# Patient Record
Sex: Male | Born: 1945 | Race: White | Hispanic: No | Marital: Married | Smoking: Former smoker
Health system: Southern US, Community
[De-identification: ages and names within clinical notes are randomized; demographics above are authoritative.]

## PROBLEM LIST (undated history)

## (undated) DIAGNOSIS — I2510 Atherosclerotic heart disease of native coronary artery without angina pectoris: Secondary | ICD-10-CM

## (undated) DIAGNOSIS — E785 Hyperlipidemia, unspecified: Secondary | ICD-10-CM

## (undated) DIAGNOSIS — I259 Chronic ischemic heart disease, unspecified: Secondary | ICD-10-CM

## (undated) DIAGNOSIS — R7301 Impaired fasting glucose: Secondary | ICD-10-CM

## (undated) DIAGNOSIS — I10 Essential (primary) hypertension: Secondary | ICD-10-CM

## (undated) DIAGNOSIS — M1990 Unspecified osteoarthritis, unspecified site: Secondary | ICD-10-CM

## (undated) HISTORY — DX: Chronic ischemic heart disease, unspecified: I25.9

## (undated) HISTORY — DX: Unspecified osteoarthritis, unspecified site: M19.90

## (undated) HISTORY — PX: CARDIAC CATHETERIZATION: SHX172

## (undated) HISTORY — DX: Impaired fasting glucose: R73.01

## (undated) HISTORY — DX: Atherosclerotic heart disease of native coronary artery without angina pectoris: I25.10

## (undated) HISTORY — DX: Hyperlipidemia, unspecified: E78.5

## (undated) HISTORY — DX: Essential (primary) hypertension: I10

---

## 2013-05-14 DIAGNOSIS — Z951 Presence of aortocoronary bypass graft: Secondary | ICD-10-CM

## 2013-05-14 HISTORY — DX: Presence of aortocoronary bypass graft: Z95.1

## 2013-10-30 LAB — HM COLONOSCOPY

## 2014-04-17 DIAGNOSIS — K4090 Unilateral inguinal hernia, without obstruction or gangrene, not specified as recurrent: Secondary | ICD-10-CM | POA: Diagnosis not present

## 2014-05-01 DIAGNOSIS — E780 Pure hypercholesterolemia: Secondary | ICD-10-CM | POA: Diagnosis not present

## 2014-05-01 DIAGNOSIS — K4090 Unilateral inguinal hernia, without obstruction or gangrene, not specified as recurrent: Secondary | ICD-10-CM | POA: Diagnosis not present

## 2014-05-01 DIAGNOSIS — M1990 Unspecified osteoarthritis, unspecified site: Secondary | ICD-10-CM | POA: Diagnosis not present

## 2014-05-01 DIAGNOSIS — I2510 Atherosclerotic heart disease of native coronary artery without angina pectoris: Secondary | ICD-10-CM | POA: Diagnosis not present

## 2014-05-01 DIAGNOSIS — Z87891 Personal history of nicotine dependence: Secondary | ICD-10-CM | POA: Diagnosis not present

## 2014-05-01 DIAGNOSIS — Z7902 Long term (current) use of antithrombotics/antiplatelets: Secondary | ICD-10-CM | POA: Diagnosis not present

## 2014-05-01 DIAGNOSIS — I10 Essential (primary) hypertension: Secondary | ICD-10-CM | POA: Diagnosis not present

## 2014-05-01 DIAGNOSIS — E785 Hyperlipidemia, unspecified: Secondary | ICD-10-CM | POA: Diagnosis not present

## 2014-05-01 DIAGNOSIS — Z79899 Other long term (current) drug therapy: Secondary | ICD-10-CM | POA: Diagnosis not present

## 2014-05-01 DIAGNOSIS — Z7982 Long term (current) use of aspirin: Secondary | ICD-10-CM | POA: Diagnosis not present

## 2014-05-01 DIAGNOSIS — Z791 Long term (current) use of non-steroidal anti-inflammatories (NSAID): Secondary | ICD-10-CM | POA: Diagnosis not present

## 2014-05-01 DIAGNOSIS — Z955 Presence of coronary angioplasty implant and graft: Secondary | ICD-10-CM | POA: Diagnosis not present

## 2014-06-17 DIAGNOSIS — H25019 Cortical age-related cataract, unspecified eye: Secondary | ICD-10-CM | POA: Diagnosis not present

## 2014-07-15 DIAGNOSIS — I2583 Coronary atherosclerosis due to lipid rich plaque: Secondary | ICD-10-CM | POA: Diagnosis not present

## 2014-07-15 DIAGNOSIS — I2510 Atherosclerotic heart disease of native coronary artery without angina pectoris: Secondary | ICD-10-CM | POA: Diagnosis not present

## 2014-07-28 DIAGNOSIS — I2583 Coronary atherosclerosis due to lipid rich plaque: Secondary | ICD-10-CM | POA: Diagnosis not present

## 2014-07-28 DIAGNOSIS — I10 Essential (primary) hypertension: Secondary | ICD-10-CM | POA: Diagnosis not present

## 2014-07-28 DIAGNOSIS — I2510 Atherosclerotic heart disease of native coronary artery without angina pectoris: Secondary | ICD-10-CM | POA: Diagnosis not present

## 2014-07-28 DIAGNOSIS — R7989 Other specified abnormal findings of blood chemistry: Secondary | ICD-10-CM | POA: Diagnosis not present

## 2014-07-28 DIAGNOSIS — R7301 Impaired fasting glucose: Secondary | ICD-10-CM | POA: Diagnosis not present

## 2014-07-29 DIAGNOSIS — I482 Chronic atrial fibrillation: Secondary | ICD-10-CM | POA: Diagnosis not present

## 2014-07-29 DIAGNOSIS — I2510 Atherosclerotic heart disease of native coronary artery without angina pectoris: Secondary | ICD-10-CM | POA: Diagnosis not present

## 2014-07-29 DIAGNOSIS — R7301 Impaired fasting glucose: Secondary | ICD-10-CM | POA: Diagnosis not present

## 2014-07-29 DIAGNOSIS — E785 Hyperlipidemia, unspecified: Secondary | ICD-10-CM | POA: Diagnosis not present

## 2014-08-01 DIAGNOSIS — R630 Anorexia: Secondary | ICD-10-CM | POA: Diagnosis not present

## 2014-08-01 DIAGNOSIS — D649 Anemia, unspecified: Secondary | ICD-10-CM | POA: Diagnosis not present

## 2014-08-01 DIAGNOSIS — F329 Major depressive disorder, single episode, unspecified: Secondary | ICD-10-CM

## 2014-08-01 DIAGNOSIS — I2583 Coronary atherosclerosis due to lipid rich plaque: Secondary | ICD-10-CM | POA: Diagnosis not present

## 2014-08-01 DIAGNOSIS — R040 Epistaxis: Secondary | ICD-10-CM | POA: Diagnosis not present

## 2014-08-01 DIAGNOSIS — R5383 Other fatigue: Secondary | ICD-10-CM | POA: Diagnosis not present

## 2014-08-01 DIAGNOSIS — I2510 Atherosclerotic heart disease of native coronary artery without angina pectoris: Secondary | ICD-10-CM | POA: Diagnosis not present

## 2014-08-01 DIAGNOSIS — I10 Essential (primary) hypertension: Secondary | ICD-10-CM | POA: Diagnosis not present

## 2014-08-01 DIAGNOSIS — R7989 Other specified abnormal findings of blood chemistry: Secondary | ICD-10-CM | POA: Diagnosis not present

## 2014-08-01 DIAGNOSIS — M1990 Unspecified osteoarthritis, unspecified site: Secondary | ICD-10-CM | POA: Diagnosis not present

## 2014-08-01 DIAGNOSIS — F321 Major depressive disorder, single episode, moderate: Secondary | ICD-10-CM | POA: Diagnosis not present

## 2014-08-01 DIAGNOSIS — E119 Type 2 diabetes mellitus without complications: Secondary | ICD-10-CM | POA: Diagnosis not present

## 2014-08-01 HISTORY — DX: Major depressive disorder, single episode, unspecified: F32.9

## 2014-08-04 DIAGNOSIS — R7989 Other specified abnormal findings of blood chemistry: Secondary | ICD-10-CM | POA: Diagnosis not present

## 2014-08-04 DIAGNOSIS — R634 Abnormal weight loss: Secondary | ICD-10-CM | POA: Diagnosis not present

## 2014-08-04 DIAGNOSIS — N281 Cyst of kidney, acquired: Secondary | ICD-10-CM | POA: Diagnosis not present

## 2014-09-04 DIAGNOSIS — I10 Essential (primary) hypertension: Secondary | ICD-10-CM | POA: Diagnosis not present

## 2014-09-04 DIAGNOSIS — R7989 Other specified abnormal findings of blood chemistry: Secondary | ICD-10-CM | POA: Diagnosis not present

## 2014-09-04 DIAGNOSIS — Z951 Presence of aortocoronary bypass graft: Secondary | ICD-10-CM | POA: Diagnosis not present

## 2014-09-04 DIAGNOSIS — I2583 Coronary atherosclerosis due to lipid rich plaque: Secondary | ICD-10-CM | POA: Diagnosis not present

## 2014-09-04 DIAGNOSIS — E780 Pure hypercholesterolemia: Secondary | ICD-10-CM | POA: Diagnosis not present

## 2014-09-04 DIAGNOSIS — I2510 Atherosclerotic heart disease of native coronary artery without angina pectoris: Secondary | ICD-10-CM | POA: Diagnosis not present

## 2014-09-09 DIAGNOSIS — I2510 Atherosclerotic heart disease of native coronary artery without angina pectoris: Secondary | ICD-10-CM | POA: Diagnosis not present

## 2014-09-09 DIAGNOSIS — E119 Type 2 diabetes mellitus without complications: Secondary | ICD-10-CM | POA: Diagnosis not present

## 2014-09-09 DIAGNOSIS — E780 Pure hypercholesterolemia: Secondary | ICD-10-CM | POA: Diagnosis not present

## 2014-09-09 DIAGNOSIS — I10 Essential (primary) hypertension: Secondary | ICD-10-CM | POA: Diagnosis not present

## 2014-09-09 DIAGNOSIS — E1121 Type 2 diabetes mellitus with diabetic nephropathy: Secondary | ICD-10-CM

## 2014-09-09 DIAGNOSIS — R7301 Impaired fasting glucose: Secondary | ICD-10-CM | POA: Diagnosis not present

## 2014-09-09 DIAGNOSIS — K7689 Other specified diseases of liver: Secondary | ICD-10-CM | POA: Diagnosis not present

## 2014-09-09 DIAGNOSIS — I2583 Coronary atherosclerosis due to lipid rich plaque: Secondary | ICD-10-CM | POA: Diagnosis not present

## 2014-09-09 HISTORY — DX: Type 2 diabetes mellitus with diabetic nephropathy: E11.21

## 2015-01-05 DIAGNOSIS — I2583 Coronary atherosclerosis due to lipid rich plaque: Secondary | ICD-10-CM | POA: Diagnosis not present

## 2015-01-05 DIAGNOSIS — I10 Essential (primary) hypertension: Secondary | ICD-10-CM | POA: Diagnosis not present

## 2015-01-05 DIAGNOSIS — I2510 Atherosclerotic heart disease of native coronary artery without angina pectoris: Secondary | ICD-10-CM | POA: Diagnosis not present

## 2015-01-05 DIAGNOSIS — R7301 Impaired fasting glucose: Secondary | ICD-10-CM | POA: Diagnosis not present

## 2015-01-05 DIAGNOSIS — E119 Type 2 diabetes mellitus without complications: Secondary | ICD-10-CM | POA: Diagnosis not present

## 2015-01-05 DIAGNOSIS — Z23 Encounter for immunization: Secondary | ICD-10-CM | POA: Diagnosis not present

## 2015-01-27 DIAGNOSIS — E7800 Pure hypercholesterolemia, unspecified: Secondary | ICD-10-CM | POA: Diagnosis not present

## 2015-01-27 DIAGNOSIS — Z951 Presence of aortocoronary bypass graft: Secondary | ICD-10-CM | POA: Diagnosis not present

## 2015-01-27 DIAGNOSIS — R7301 Impaired fasting glucose: Secondary | ICD-10-CM | POA: Diagnosis not present

## 2015-01-27 DIAGNOSIS — I10 Essential (primary) hypertension: Secondary | ICD-10-CM | POA: Diagnosis not present

## 2015-01-27 DIAGNOSIS — I2510 Atherosclerotic heart disease of native coronary artery without angina pectoris: Secondary | ICD-10-CM | POA: Diagnosis not present

## 2015-01-27 DIAGNOSIS — I2583 Coronary atherosclerosis due to lipid rich plaque: Secondary | ICD-10-CM | POA: Diagnosis not present

## 2015-02-02 DIAGNOSIS — I519 Heart disease, unspecified: Secondary | ICD-10-CM | POA: Diagnosis present

## 2015-02-02 DIAGNOSIS — R0602 Shortness of breath: Secondary | ICD-10-CM | POA: Diagnosis not present

## 2015-02-02 DIAGNOSIS — Z7982 Long term (current) use of aspirin: Secondary | ICD-10-CM | POA: Diagnosis not present

## 2015-02-02 DIAGNOSIS — I129 Hypertensive chronic kidney disease with stage 1 through stage 4 chronic kidney disease, or unspecified chronic kidney disease: Secondary | ICD-10-CM | POA: Diagnosis not present

## 2015-02-02 DIAGNOSIS — R1310 Dysphagia, unspecified: Secondary | ICD-10-CM | POA: Diagnosis not present

## 2015-02-02 DIAGNOSIS — E785 Hyperlipidemia, unspecified: Secondary | ICD-10-CM | POA: Diagnosis not present

## 2015-02-02 DIAGNOSIS — Z9889 Other specified postprocedural states: Secondary | ICD-10-CM | POA: Diagnosis not present

## 2015-02-02 DIAGNOSIS — Z89011 Acquired absence of right thumb: Secondary | ICD-10-CM | POA: Diagnosis not present

## 2015-02-02 DIAGNOSIS — T18198A Other foreign object in esophagus causing other injury, initial encounter: Secondary | ICD-10-CM | POA: Diagnosis not present

## 2015-02-02 DIAGNOSIS — Z951 Presence of aortocoronary bypass graft: Secondary | ICD-10-CM | POA: Diagnosis not present

## 2015-02-02 DIAGNOSIS — K222 Esophageal obstruction: Secondary | ICD-10-CM | POA: Diagnosis not present

## 2015-02-02 DIAGNOSIS — T18128A Food in esophagus causing other injury, initial encounter: Secondary | ICD-10-CM | POA: Diagnosis not present

## 2015-02-02 DIAGNOSIS — Z955 Presence of coronary angioplasty implant and graft: Secondary | ICD-10-CM | POA: Diagnosis not present

## 2015-02-02 DIAGNOSIS — T17520A Food in bronchus causing asphyxiation, initial encounter: Secondary | ICD-10-CM | POA: Diagnosis not present

## 2015-02-02 DIAGNOSIS — I2510 Atherosclerotic heart disease of native coronary artery without angina pectoris: Secondary | ICD-10-CM | POA: Diagnosis not present

## 2015-02-02 DIAGNOSIS — T18108A Unspecified foreign body in esophagus causing other injury, initial encounter: Secondary | ICD-10-CM | POA: Diagnosis not present

## 2015-02-02 DIAGNOSIS — M1990 Unspecified osteoarthritis, unspecified site: Secondary | ICD-10-CM | POA: Diagnosis not present

## 2015-02-02 DIAGNOSIS — N183 Chronic kidney disease, stage 3 (moderate): Secondary | ICD-10-CM | POA: Diagnosis not present

## 2015-02-02 DIAGNOSIS — Z7902 Long term (current) use of antithrombotics/antiplatelets: Secondary | ICD-10-CM | POA: Diagnosis not present

## 2015-02-02 DIAGNOSIS — Z79899 Other long term (current) drug therapy: Secondary | ICD-10-CM | POA: Diagnosis not present

## 2015-02-02 DIAGNOSIS — E039 Hypothyroidism, unspecified: Secondary | ICD-10-CM | POA: Diagnosis present

## 2015-02-02 DIAGNOSIS — K208 Other esophagitis: Secondary | ICD-10-CM | POA: Diagnosis present

## 2015-02-27 DIAGNOSIS — I259 Chronic ischemic heart disease, unspecified: Secondary | ICD-10-CM | POA: Diagnosis not present

## 2015-02-27 DIAGNOSIS — E1122 Type 2 diabetes mellitus with diabetic chronic kidney disease: Secondary | ICD-10-CM | POA: Diagnosis not present

## 2015-02-27 DIAGNOSIS — I129 Hypertensive chronic kidney disease with stage 1 through stage 4 chronic kidney disease, or unspecified chronic kidney disease: Secondary | ICD-10-CM | POA: Diagnosis present

## 2015-02-27 DIAGNOSIS — R571 Hypovolemic shock: Secondary | ICD-10-CM | POA: Diagnosis not present

## 2015-02-27 DIAGNOSIS — Z7982 Long term (current) use of aspirin: Secondary | ICD-10-CM | POA: Diagnosis not present

## 2015-02-27 DIAGNOSIS — N183 Chronic kidney disease, stage 3 (moderate): Secondary | ICD-10-CM | POA: Diagnosis present

## 2015-02-27 DIAGNOSIS — I2510 Atherosclerotic heart disease of native coronary artery without angina pectoris: Secondary | ICD-10-CM | POA: Diagnosis not present

## 2015-02-27 DIAGNOSIS — E785 Hyperlipidemia, unspecified: Secondary | ICD-10-CM | POA: Diagnosis present

## 2015-02-27 DIAGNOSIS — E1121 Type 2 diabetes mellitus with diabetic nephropathy: Secondary | ICD-10-CM | POA: Diagnosis not present

## 2015-02-27 DIAGNOSIS — E7800 Pure hypercholesterolemia, unspecified: Secondary | ICD-10-CM | POA: Diagnosis present

## 2015-02-27 DIAGNOSIS — Z7902 Long term (current) use of antithrombotics/antiplatelets: Secondary | ICD-10-CM | POA: Diagnosis not present

## 2015-02-27 DIAGNOSIS — D689 Coagulation defect, unspecified: Secondary | ICD-10-CM | POA: Diagnosis not present

## 2015-02-27 DIAGNOSIS — Z9889 Other specified postprocedural states: Secondary | ICD-10-CM | POA: Diagnosis not present

## 2015-02-27 DIAGNOSIS — K922 Gastrointestinal hemorrhage, unspecified: Secondary | ICD-10-CM | POA: Diagnosis not present

## 2015-02-27 DIAGNOSIS — M1990 Unspecified osteoarthritis, unspecified site: Secondary | ICD-10-CM | POA: Diagnosis present

## 2015-02-27 DIAGNOSIS — Z79899 Other long term (current) drug therapy: Secondary | ICD-10-CM | POA: Diagnosis not present

## 2015-02-27 DIAGNOSIS — I951 Orthostatic hypotension: Secondary | ICD-10-CM | POA: Diagnosis present

## 2015-02-27 DIAGNOSIS — I10 Essential (primary) hypertension: Secondary | ICD-10-CM | POA: Diagnosis not present

## 2015-02-27 DIAGNOSIS — K5731 Diverticulosis of large intestine without perforation or abscess with bleeding: Secondary | ICD-10-CM | POA: Diagnosis not present

## 2015-02-27 DIAGNOSIS — Z951 Presence of aortocoronary bypass graft: Secondary | ICD-10-CM | POA: Diagnosis not present

## 2015-02-27 DIAGNOSIS — K625 Hemorrhage of anus and rectum: Secondary | ICD-10-CM | POA: Diagnosis not present

## 2015-02-27 DIAGNOSIS — Z720 Tobacco use: Secondary | ICD-10-CM | POA: Diagnosis not present

## 2015-02-27 DIAGNOSIS — D649 Anemia, unspecified: Secondary | ICD-10-CM | POA: Diagnosis not present

## 2015-02-27 DIAGNOSIS — D62 Acute posthemorrhagic anemia: Secondary | ICD-10-CM | POA: Diagnosis present

## 2015-02-28 DIAGNOSIS — I259 Chronic ischemic heart disease, unspecified: Secondary | ICD-10-CM | POA: Insufficient documentation

## 2015-03-05 DIAGNOSIS — R0600 Dyspnea, unspecified: Secondary | ICD-10-CM | POA: Diagnosis not present

## 2015-03-05 DIAGNOSIS — I10 Essential (primary) hypertension: Secondary | ICD-10-CM | POA: Diagnosis not present

## 2015-03-05 DIAGNOSIS — I129 Hypertensive chronic kidney disease with stage 1 through stage 4 chronic kidney disease, or unspecified chronic kidney disease: Secondary | ICD-10-CM | POA: Diagnosis not present

## 2015-03-05 DIAGNOSIS — R55 Syncope and collapse: Secondary | ICD-10-CM | POA: Diagnosis not present

## 2015-03-05 DIAGNOSIS — R739 Hyperglycemia, unspecified: Secondary | ICD-10-CM | POA: Diagnosis not present

## 2015-03-05 DIAGNOSIS — Z7902 Long term (current) use of antithrombotics/antiplatelets: Secondary | ICD-10-CM | POA: Diagnosis not present

## 2015-03-05 DIAGNOSIS — Z951 Presence of aortocoronary bypass graft: Secondary | ICD-10-CM | POA: Diagnosis not present

## 2015-03-05 DIAGNOSIS — R079 Chest pain, unspecified: Secondary | ICD-10-CM | POA: Diagnosis not present

## 2015-03-05 DIAGNOSIS — E7800 Pure hypercholesterolemia, unspecified: Secondary | ICD-10-CM | POA: Diagnosis not present

## 2015-03-05 DIAGNOSIS — Z79899 Other long term (current) drug therapy: Secondary | ICD-10-CM | POA: Diagnosis not present

## 2015-03-05 DIAGNOSIS — Z87891 Personal history of nicotine dependence: Secondary | ICD-10-CM | POA: Diagnosis not present

## 2015-03-05 DIAGNOSIS — Z7982 Long term (current) use of aspirin: Secondary | ICD-10-CM | POA: Diagnosis not present

## 2015-03-05 DIAGNOSIS — N189 Chronic kidney disease, unspecified: Secondary | ICD-10-CM | POA: Diagnosis not present

## 2015-03-05 DIAGNOSIS — I2510 Atherosclerotic heart disease of native coronary artery without angina pectoris: Secondary | ICD-10-CM | POA: Diagnosis not present

## 2015-03-05 DIAGNOSIS — R0602 Shortness of breath: Secondary | ICD-10-CM | POA: Diagnosis not present

## 2015-03-23 DIAGNOSIS — I2583 Coronary atherosclerosis due to lipid rich plaque: Secondary | ICD-10-CM | POA: Diagnosis not present

## 2015-03-23 DIAGNOSIS — I2510 Atherosclerotic heart disease of native coronary artery without angina pectoris: Secondary | ICD-10-CM | POA: Diagnosis not present

## 2015-03-23 DIAGNOSIS — I10 Essential (primary) hypertension: Secondary | ICD-10-CM | POA: Diagnosis not present

## 2015-03-23 DIAGNOSIS — M10271 Drug-induced gout, right ankle and foot: Secondary | ICD-10-CM | POA: Diagnosis not present

## 2015-03-23 DIAGNOSIS — D508 Other iron deficiency anemias: Secondary | ICD-10-CM | POA: Diagnosis not present

## 2015-04-22 DIAGNOSIS — K922 Gastrointestinal hemorrhage, unspecified: Secondary | ICD-10-CM | POA: Diagnosis not present

## 2015-04-22 DIAGNOSIS — D649 Anemia, unspecified: Secondary | ICD-10-CM | POA: Diagnosis not present

## 2015-06-24 DIAGNOSIS — R208 Other disturbances of skin sensation: Secondary | ICD-10-CM | POA: Diagnosis not present

## 2015-06-24 DIAGNOSIS — L853 Xerosis cutis: Secondary | ICD-10-CM | POA: Diagnosis not present

## 2015-06-29 DIAGNOSIS — R7301 Impaired fasting glucose: Secondary | ICD-10-CM | POA: Diagnosis not present

## 2015-06-29 DIAGNOSIS — M25511 Pain in right shoulder: Secondary | ICD-10-CM | POA: Diagnosis not present

## 2015-06-29 DIAGNOSIS — I10 Essential (primary) hypertension: Secondary | ICD-10-CM | POA: Diagnosis not present

## 2015-06-29 DIAGNOSIS — M25552 Pain in left hip: Secondary | ICD-10-CM | POA: Diagnosis not present

## 2015-06-29 DIAGNOSIS — E7800 Pure hypercholesterolemia, unspecified: Secondary | ICD-10-CM | POA: Diagnosis not present

## 2015-06-30 DIAGNOSIS — M19011 Primary osteoarthritis, right shoulder: Secondary | ICD-10-CM | POA: Diagnosis not present

## 2015-07-28 DIAGNOSIS — I10 Essential (primary) hypertension: Secondary | ICD-10-CM | POA: Diagnosis not present

## 2015-07-28 DIAGNOSIS — I259 Chronic ischemic heart disease, unspecified: Secondary | ICD-10-CM | POA: Diagnosis not present

## 2015-07-28 DIAGNOSIS — R7301 Impaired fasting glucose: Secondary | ICD-10-CM | POA: Diagnosis not present

## 2015-07-28 DIAGNOSIS — E7800 Pure hypercholesterolemia, unspecified: Secondary | ICD-10-CM | POA: Diagnosis not present

## 2015-07-28 DIAGNOSIS — I2510 Atherosclerotic heart disease of native coronary artery without angina pectoris: Secondary | ICD-10-CM | POA: Diagnosis not present

## 2015-08-21 DIAGNOSIS — M25511 Pain in right shoulder: Secondary | ICD-10-CM | POA: Diagnosis not present

## 2015-08-21 DIAGNOSIS — M25552 Pain in left hip: Secondary | ICD-10-CM | POA: Diagnosis not present

## 2015-08-21 DIAGNOSIS — M25512 Pain in left shoulder: Secondary | ICD-10-CM | POA: Diagnosis not present

## 2015-08-24 DIAGNOSIS — I10 Essential (primary) hypertension: Secondary | ICD-10-CM | POA: Diagnosis not present

## 2015-08-24 DIAGNOSIS — R7301 Impaired fasting glucose: Secondary | ICD-10-CM | POA: Diagnosis not present

## 2015-08-24 DIAGNOSIS — I2510 Atherosclerotic heart disease of native coronary artery without angina pectoris: Secondary | ICD-10-CM | POA: Diagnosis not present

## 2015-08-24 DIAGNOSIS — E7800 Pure hypercholesterolemia, unspecified: Secondary | ICD-10-CM | POA: Diagnosis not present

## 2016-01-28 DIAGNOSIS — Z23 Encounter for immunization: Secondary | ICD-10-CM | POA: Diagnosis not present

## 2016-03-31 ENCOUNTER — Telehealth (HOSPITAL_COMMUNITY): Payer: Self-pay | Admitting: Internal Medicine

## 2016-03-31 NOTE — Telephone Encounter (Signed)
Called & spoke with Bruce Jordan @Dr . John Jordan's office in [place]. I advised Bruce DashLucretia that pt is not a candidate to be seen at the [facility] per Bruce StaggersHeather Schub, Bruce Jordan, & would need to be referred to general cardiologist.  Bruce DashLucretia voiced understanding & will advise Dr. Festus AloePasquini.

## 2016-04-12 ENCOUNTER — Telehealth: Payer: Self-pay | Admitting: Cardiology

## 2016-04-12 NOTE — Telephone Encounter (Signed)
Received records from [facility] for appointment on 04/19/16 with Dr SwazilandJordan.  Records put with Dr Elvis CoilJordan's schedule for 04/19/16. lp

## 2016-04-14 NOTE — Progress Notes (Deleted)
   Cardiology Office Note    Date:  04/14/2016   ID:  Bruce Jordan. Bruce Jordan, DOB 1945-11-05, MRN [id number]  PCP:  No primary care provider on file.  Cardiologist:  Torri Michalski [place]az[place]Jordan, MD    History of Present Illness:  Bruce Jordan is a 71 y.o. male ***    No past medical history on file.  No past surgical history on file.  Current Medications: No outpatient prescriptions prior to visit.   No facility-administered medications prior to visit.      Allergies:   Patient has no allergy information on record.   Social History   Social History  . Marital status: N/A    Spouse name: N/A  . Number of children: N/A  . Years of education: N/A   Social History Main Topics  . Smoking status: Not on file  . Smokeless tobacco: Not on file  . Alcohol use Not on file  . Drug use: Unknown  . Sexual activity: Not on file   Other Topics Concern  . Not on file   Social History Narrative  . No narrative on file     Family History:  The patient's ***family history is not on file.   ROS:   Please see the history of present illness.    ROS All other systems reviewed and are negative.   PHYSICAL EXAM:   VS:  There were no vitals taken for this visit.   GEN: Well nourished, well developed, in no acute distress  HEENT: normal  Neck: no JVD, carotid bruits, or masses Cardiac: ***RRR; no murmurs, rubs, or gallops,no edema  Respiratory:  clear to auscultation bilaterally, normal work of breathing GI: soft, nontender, nondistended, + BS MS: no deformity or atrophy  Skin: warm and dry, no rash Neuro:  Alert and Oriented x 3, Strength and sensation are intact Psych: euthymic mood, full affect  Wt Readings from Last 3 Encounters:  No data found for Wt      Studies/Labs Reviewed:   EKG:  EKG is*** ordered today.  The ekg ordered today demonstrates ***  Recent Labs: No results found for requested labs within last 8760 hours.   Lipid Panel No results found for: CHOL, TRIG, HDL,  CHOLHDL, VLDL, LDLCALC, LDLDIRECT  Additional studies/ records that were reviewed today include:  ***  .padscreen  ASSESSMENT:    No diagnosis found.   PLAN:  In order of problems listed above:  1. ***    Medication Adjustments/Labs and Tests Ordered: Current medicines are reviewed at length with the patient today.  Concerns regarding medicines are outlined above.  Medication changes, Labs and Tests ordered today are listed in the Patient Instructions below. There are no Patient Instructions on file for this visit.   Signed, Navreet Bolda [place]azilandJordan, MD  04/14/2016 4:52 PM    [facility] [address], [place], [place], [id number] [phone]

## 2016-04-19 ENCOUNTER — Ambulatory Visit: Payer: Medicare Other | Admitting: Cardiology

## 2016-05-13 ENCOUNTER — Encounter: Payer: Self-pay | Admitting: Cardiology

## 2016-05-13 ENCOUNTER — Ambulatory Visit (INDEPENDENT_AMBULATORY_CARE_PROVIDER_SITE_OTHER): Payer: Medicare Other | Admitting: Cardiology

## 2016-05-13 VITALS — BP 142/79 | HR 59 | Ht 60.0 in | Wt 143.0 lb

## 2016-05-13 DIAGNOSIS — N183 Chronic kidney disease, stage 3 unspecified: Secondary | ICD-10-CM

## 2016-05-13 DIAGNOSIS — I10 Essential (primary) hypertension: Secondary | ICD-10-CM

## 2016-05-13 DIAGNOSIS — E7800 Pure hypercholesterolemia, unspecified: Secondary | ICD-10-CM

## 2016-05-13 DIAGNOSIS — I25118 Atherosclerotic heart disease of native coronary artery with other forms of angina pectoris: Secondary | ICD-10-CM

## 2016-05-13 NOTE — Progress Notes (Signed)
Cardiology Office Note    Date:  05/13/2016   ID:  Bruce Mondrew T Barrero Jr., DOB Nov 13, 1945, MRN [id number]  PCP:  No primary care provider on file.  Cardiologist:  Xavious Sharrar [place]az[place], MD    History of Present Illness:  Bruce Mondrew T Strandberg Jr. is a 71 y.o. male seen at the request of Dr. Jonny RuizJohn Pasquini in [place] to establish cardiac care here in [place]. He has a history of CAD s/p CABG in 2008 in [place] for failed PCI. This was a LIMA to the LAD. In 2013 he had PCI of a tortuous RCA. In April 2016 he had a low risk Cardiolite without evidence of ischemia. He has a history of HTN and dyslipidemia. He has CKD stage 3 with creatinine 1.47. He has diabetes mellitus type 2 with neuropathy.  [place] closer to [place]. [place] [facility] [place] with his wife. 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 He works at [employer] for [employer]. Stays busy. He denies any chest pain, SOB, palpitations, dizziness. Really feels great.     Past Medical History:  Diagnosis Date  . Chronic ischemic heart disease   . Coronary artery disease   . Hyperlipidemia   . Hypertension   . IFG (impaired fasting glucose)   . Osteoarthritis     Past Surgical History:  Procedure Laterality Date  . CARDIAC CATHETERIZATION      Current Medications: Outpatient Medications Prior to Visit  Medication Sig Dispense Refill  . aspirin EC 81 MG tablet Take 81 mg by mouth daily.    Marland Kitchen. atorvastatin (LIPITOR) 40 MG tablet Take 40 mg by mouth daily.    . cholecalciferol (VITAMIN D) 400 units TABS tablet Take 400 Units by mouth daily.    . citalopram (CELEXA) 10 MG tablet Take 10 mg by mouth daily.    . clobetasol (TEMOVATE) 0.05 % GEL Apply topically 2 (two) times daily.    Marland Kitchen. Co-Enzyme Q-10 100 MG CAPS Take 10 mg by mouth daily.    . diclofenac sodium (VOLTAREN) 1 % GEL Apply 2 g topically 4 (four) times daily.    . ferrous sulfate 325 (65 FE) MG tablet Take 325 mg by mouth daily with breakfast.    .  lisinopril (PRINIVIL,ZESTRIL) 20 MG tablet Take 20 mg by mouth daily.    . metoprolol tartrate (LOPRESSOR) 25 MG tablet Take 12.5 mg by mouth 2 (two) times daily.    . niacin 500 MG tablet Take 500 mg by mouth at bedtime.    . sildenafil (VIAGRA) 100 MG tablet Take 100 mg by mouth daily as needed for erectile dysfunction.    . clopidogrel (PLAVIX) 75 MG tablet Take 75 mg by mouth daily.     No facility-administered medications prior to visit.      Allergies:   Patient has no known allergies.   Social History   Social History  . Marital status: Married    Spouse name: N/A  . Number of children: N/A  . Years of education: N/A   Social History Main Topics  . Smoking status: Former Games developermoker  . Smokeless tobacco: Never Used  . Alcohol use None  . Drug use: Unknown  . Sexual activity: Not Asked   Other Topics Concern  . None   Social History Narrative  . None     Family History:  The patient's family history includes Cancer in his father; Heart disease in his father and mother; Hypertension in his mother.  ROS:   Please see the history of present illness.    ROS All other systems reviewed and are negative.   PHYSICAL EXAM:   VS:  BP (!) 142/79 (BP Location: Left Arm, Patient Position: Sitting, Cuff Size: Normal)   Pulse (!) 59   Ht 5' (1.524 m)   Wt 143 lb (64.9 kg)   BMI 27.93 kg/m    GEN: Well nourished, well developed, in no acute distress  HEENT: normal  Neck: no JVD, carotid bruits, or masses Cardiac: RRR; no murmurs, rubs, or gallops,no edema  Respiratory:  clear to auscultation bilaterally, normal work of breathing GI: soft, nontender, nondistended, + BS MS: no deformity or atrophy  Skin: warm and dry, no rash Neuro:  Alert and Oriented x 3, Strength and sensation are intact Psych: euthymic mood, full affect  Wt Readings from Last 3 Encounters:  05/13/16 143 lb (64.9 kg)      Studies/Labs Reviewed:   EKG:  EKG is ordered today.  The ekg ordered today  demonstrates NSR with rate 57. Frequent PVCs. Computer reads a long QT but in my interpretation I think QT normal but there is a prominent U wave.I have personally reviewed and interpreted this study.   Recent Labs: No results found for requested labs within last 8760 hours.   Lipid Panel No results found for: CHOL, TRIG, HDL, CHOLHDL, VLDL, LDLCALC, LDLDIRECT  Additional studies/ records that were reviewed today include:  Labs dated 08/24/15: glucose 177, creatinine 1.47. Otherwise CMET normal. Platelets 118K, otherwise CBC is normal. A1c 5.9%. TSH normal. Cholesterol 170, triglycerides 119, LDL 85, HDL 61. LDL particle number [id number].  ASSESSMENT:    1. Essential hypertension   2. CKD (chronic kidney disease) stage 3, GFR 30-59 ml/min   3. Pure hypercholesterolemia   4. Coronary artery disease of native artery of native heart with stable angina pectoris (HCC)      PLAN:  In order of problems listed above:  1. Patient is doing well from a cardiac standpoint. No symptoms. No indication for Plavix so I have recommended he stop this. Also reviewed lack of any outcome benefit with niacin when used with a statin so OK to stop niacin. Will continue other medications. Follow up in 6 months. Patient to establish with primary care in [place].     Medication Adjustments/Labs and Tests Ordered: Current medicines are reviewed at length with the patient today.  Concerns regarding medicines are outlined above.  Medication changes, Labs and Tests ordered today are listed in the Patient Instructions below. Patient Instructions  You may stop taking Plavix.  Continue your other therapy - OK to stop niacin from my standpoint  I will see you in 6 months.      Signed, Amyr Sluder [place], MD  05/13/2016 4:53 PM    [facility] [address] [place], [place], [address] [phone]

## 2016-05-13 NOTE — Patient Instructions (Signed)
You may stop taking Plavix.  Continue your other therapy - OK to stop niacin from my standpoint  I will see you in 6 months.

## 2016-05-18 ENCOUNTER — Telehealth: Payer: Self-pay | Admitting: Cardiology

## 2016-05-18 NOTE — Telephone Encounter (Signed)
New Message   *STAT* If patient is at the pharmacy, call can be transferred to refill team.   1. Which medications need to be refilled? (please list name of each medication and dose if known) citalopram (Celxa) 10 mg tablet once daily  2. Which pharmacy/location (including street and city if local pharmacy) is medication to be sent to[facility] [address], [place]  3. Do they need a 30 day or 90 day supply? 30 day supply

## 2016-05-18 NOTE — Telephone Encounter (Signed)
We can refill for now until he gets established with primary care here  Ambriel Gorelick Swaz[place]Jordan MD, [facility]

## 2016-05-18 NOTE — Telephone Encounter (Signed)
Will forward to Dr SwazilandJordan,  To confirm if okay fill x one or refer for primary Reviewed chart patient is new to area.

## 2016-05-19 ENCOUNTER — Telehealth: Payer: Self-pay | Admitting: Internal Medicine

## 2016-05-19 MED ORDER — CITALOPRAM HYDROBROMIDE 10 MG PO TABS: 10.0000 mg | ORAL_TABLET | Freq: Every day | ORAL | 1 refills | Status: DC

## 2016-05-19 NOTE — Telephone Encounter (Signed)
Patient would like to know if he can be taken on as a patient of Dr. Yetta BarreJones.

## 2016-05-19 NOTE — Telephone Encounter (Signed)
Spoke to patient.Advised of Dr.Jordan's recommendations.Stated he will be making PCP appointment today.Celexa refill sent to pharmacy.

## 2016-05-19 NOTE — Telephone Encounter (Signed)
yes

## 2016-05-20 NOTE — Telephone Encounter (Signed)
Got patient scheduled

## 2016-05-25 ENCOUNTER — Ambulatory Visit (INDEPENDENT_AMBULATORY_CARE_PROVIDER_SITE_OTHER): Payer: Medicare Other | Admitting: Internal Medicine

## 2016-05-25 ENCOUNTER — Encounter: Payer: Self-pay | Admitting: Internal Medicine

## 2016-05-25 VITALS — BP 110/50 | HR 51 | Temp 97.7°F | Ht 60.0 in | Wt 140.0 lb

## 2016-05-25 DIAGNOSIS — L438 Other lichen planus: Secondary | ICD-10-CM

## 2016-05-25 DIAGNOSIS — Z23 Encounter for immunization: Secondary | ICD-10-CM | POA: Diagnosis not present

## 2016-05-25 DIAGNOSIS — E785 Hyperlipidemia, unspecified: Secondary | ICD-10-CM | POA: Diagnosis not present

## 2016-05-25 DIAGNOSIS — L309 Dermatitis, unspecified: Secondary | ICD-10-CM | POA: Insufficient documentation

## 2016-05-25 DIAGNOSIS — M19011 Primary osteoarthritis, right shoulder: Secondary | ICD-10-CM

## 2016-05-25 DIAGNOSIS — I10 Essential (primary) hypertension: Secondary | ICD-10-CM

## 2016-05-25 DIAGNOSIS — I2510 Atherosclerotic heart disease of native coronary artery without angina pectoris: Secondary | ICD-10-CM | POA: Insufficient documentation

## 2016-05-25 DIAGNOSIS — R7303 Prediabetes: Secondary | ICD-10-CM

## 2016-05-25 DIAGNOSIS — N400 Enlarged prostate without lower urinary tract symptoms: Secondary | ICD-10-CM

## 2016-05-25 DIAGNOSIS — D500 Iron deficiency anemia secondary to blood loss (chronic): Secondary | ICD-10-CM

## 2016-05-25 DIAGNOSIS — M19012 Primary osteoarthritis, left shoulder: Secondary | ICD-10-CM

## 2016-05-25 HISTORY — DX: Primary osteoarthritis, right shoulder: M19.011

## 2016-05-25 HISTORY — DX: Benign prostatic hyperplasia without lower urinary tract symptoms: N40.0

## 2016-05-25 HISTORY — DX: Essential (primary) hypertension: I10

## 2016-05-25 HISTORY — DX: Dermatitis, unspecified: L30.9

## 2016-05-25 HISTORY — DX: Hyperlipidemia, unspecified: E78.5

## 2016-05-25 HISTORY — DX: Other lichen planus: L43.8

## 2016-05-25 HISTORY — DX: Atherosclerotic heart disease of native coronary artery without angina pectoris: I25.10

## 2016-05-25 HISTORY — DX: Iron deficiency anemia secondary to blood loss (chronic): D50.0

## 2016-05-25 MED ORDER — MELOXICAM 15 MG PO TABS: 15.0000 mg | ORAL_TABLET | Freq: Every day | ORAL | 1 refills | Status: DC

## 2016-05-25 MED ORDER — CLOBETASOL PROPIONATE 0.05 % EX GEL: 1.0000 | Freq: Two times a day (BID) | CUTANEOUS | 2 refills | Status: DC

## 2016-05-25 MED ORDER — CLOBETASOL PROPIONATE 0.05 % EX CREA: 1.0000 "application " | TOPICAL_CREAM | Freq: Two times a day (BID) | CUTANEOUS | 2 refills | Status: DC

## 2016-05-25 NOTE — Progress Notes (Signed)
Pre visit review using our clinic review tool, if applicable. No additional management support is needed unless otherwise documented below in the visit note. 

## 2016-05-25 NOTE — Progress Notes (Signed)
Subjective:  Patient ID: Bruce Jordan., male    DOB: [birth year]  Age: 71 y.o. MRN: [id number]  CC: Hypertension; Hyperlipidemia; and Anemia  NEW TO ME  HPI [name]. presents for Establishing as a primary care patient. He has had coronary artery bypass and stenting in 2008 and 2015 respectively. He has had no recent episodes of chest pain or shortness of breath. 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 He takes iron replacement therapy. He also has chronic rash on his lower extremities diagnosed as eczema. 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 These 2 skin conditions are treated successfully with topical steroids. He complains of chronic bilateral shoulder pain and wants to try an anti-inflammatory. He previously saw an orthopedist in [place] and was told that he had DJD in his shoulders. He does not want to have shoulder surgery.  History Schylar has a past medical history of Chronic ischemic heart disease; Coronary artery disease; Hyperlipidemia; Hypertension; IFG (impaired fasting glucose); and Osteoarthritis.   He has a past surgical history that includes Cardiac catheterization.   His family history includes Cancer in his father; Heart disease in his father and mother; Hypertension in his mother.He reports that he has quit smoking. He has never used smokeless tobacco. His alcohol and drug histories are not on file.  Outpatient Medications Prior to Visit  Medication Sig Dispense Refill  . Ascorbic Acid (VITAMIN C) 100 MG tablet Take 100 mg by mouth daily.    Marland Kitchen aspirin EC 81 MG tablet Take 81 mg by mouth daily.    Marland Kitchen atorvastatin (LIPITOR) 40 MG tablet Take 40 mg by mouth daily.    . cholecalciferol (VITAMIN D) 400 units TABS tablet Take 400 Units by mouth daily.    . citalopram (CELEXA) 10 MG tablet Take 1 tablet (10 mg total) by mouth daily. 30 tablet 1  . Co-Enzyme Q-10 100 MG CAPS Take 10 mg by mouth daily.     . diclofenac sodium (VOLTAREN) 1 % GEL Apply 2 g topically 4 (four) times daily.    . ferrous sulfate 325 (65 FE) MG tablet Take 325 mg by mouth daily with breakfast.    . lisinopril (PRINIVIL,ZESTRIL) 20 MG tablet Take 20 mg by mouth daily.    . metoprolol tartrate (LOPRESSOR) 25 MG tablet Take 12.5 mg by mouth 2 (two) times daily.    . niacin 500 MG tablet Take 500 mg by mouth at bedtime.    . sildenafil (VIAGRA) 100 MG tablet Take 100 mg by mouth daily as needed for erectile dysfunction.    . clobetasol (TEMOVATE) 0.05 % GEL Apply topically 2 (two) times daily.     No facility-administered medications prior to visit.     ROS Review of Systems  Constitutional: Negative for appetite change, diaphoresis, fatigue and unexpected weight change.  HENT: Negative.  Negative for trouble swallowing.   Eyes: Negative for visual disturbance.  Respiratory: Negative for cough, chest tightness, shortness of breath and wheezing.   Cardiovascular: Negative for chest pain, palpitations and leg swelling.  Gastrointestinal: Negative for abdominal pain, constipation, diarrhea, nausea and vomiting.  Endocrine: Negative.   Genitourinary: Negative.  Negative for difficulty urinating.  Musculoskeletal: Positive for arthralgias. Negative for back pain, myalgias and neck pain.  Skin: Negative.  Negative for color change and rash.  Allergic/Immunologic: Negative.   Neurological: Negative.  Negative for dizziness, weakness and light-headedness.  Hematological: Negative.  Negative for adenopathy. Does not bruise/bleed easily.  Psychiatric/Behavioral: Negative for dysphoric mood, sleep disturbance and suicidal ideas. The patient is nervous/anxious.     Objective:  BP (!) 110/50 (BP Location: Left Arm, Patient Position: Sitting, Cuff Size: Normal)   Pulse (!) 51   Temp 97.7 F (36.5 C) (Oral)   Ht 5' (1.524 m)   Wt 140 lb (63.5 kg)   SpO2 97%   BMI 27.34 kg/m   Physical Exam  Constitutional: He is  oriented to person, place, and time. No distress.  HENT:  Mouth/Throat: Oropharynx is clear and moist. No oropharyngeal exudate.  Eyes: Conjunctivae are normal. Right eye exhibits no discharge. Left eye exhibits no discharge. No scleral icterus.  Neck: Normal range of motion. Neck supple. No JVD present. No tracheal deviation present. No thyromegaly present.  Cardiovascular: Normal rate, regular rhythm, normal heart sounds and intact distal pulses.  Exam reveals no gallop and no friction rub.   No murmur heard. Pulmonary/Chest: Effort normal and breath sounds normal. No stridor. No respiratory distress. He has no wheezes. He has no rales. He exhibits no tenderness.  Abdominal: Soft. Bowel sounds are normal. He exhibits no distension and no mass. There is no tenderness. There is no rebound and no guarding.  Musculoskeletal: Normal range of motion. He exhibits no edema, tenderness or deformity.  Lymphadenopathy:    He has no cervical adenopathy.  Neurological: He is oriented to person, place, and time.  Skin: Skin is warm and dry. Rash noted. He is not diaphoretic. No erythema. No pallor.  He has mild lichen planus of the tongue and eczematous patches over his lower extremities.  Vitals reviewed.   Lab Results  Component Value Date   WBC 8.2 05/26/2016   HGB 14.3 05/26/2016   HCT 41.9 05/26/2016   PLT 140.0 (L) 05/26/2016   GLUCOSE 124 (H) 05/26/2016   CHOL 144 05/26/2016   TRIG 98.0 05/26/2016   HDL 62.80 05/26/2016   LDLCALC 62 05/26/2016   ALT 13 05/26/2016   AST 18 05/26/2016   NA 143 05/26/2016   K 4.8 05/26/2016   CL 110 05/26/2016   CREATININE 1.27 05/26/2016   BUN 19 05/26/2016   CO2 27 05/26/2016   TSH 2.94 05/26/2016   PSA 2.40 05/26/2016   HGBA1C 5.7 05/26/2016    Assessment & Plan:   Amadeo was seen today for hypertension, hyperlipidemia and anemia.  Diagnoses and all orders for this visit:  Coronary artery disease involving native coronary artery of native  heart without angina pectoris- He's had no recent episodes of angina, will continue risk factor modification with statin therapy. -     Lipid panel; Future  Hyperlipidemia with target LDL less than 70- he is achieved his LDL goal is doing well on the statin. -     Lipid panel; Future -     TSH; Future  Essential hypertension- his blood pressure is adequately well controlled, electrolytes and renal function are normal. -     Comprehensive metabolic panel; Future -     CBC with Differential/Platelet; Future -     CBC with Differential/Platelet; Future  Prediabetes -     Hemoglobin A1c; Future  Need for vaccination against Streptococcus pneumoniae -     Pneumococcal polysaccharide vaccine 23-valent greater than or equal to 2yo subcutaneous/IM  Iron deficiency anemia due to chronic blood loss - his anemia has resolved, will continue iron replacement therapy. To screen for colon polyps and colon cancer I ordered a  ColoGuard today. -     CBC with Differential/Platelet; Future -     IBC panel; Future -     Ferritin; Future  Benign prostatic hyperplasia without lower urinary tract symptoms -     PSA; Future  Erosive oral lichen planus -     clobetasol (TEMOVATE) 0.05 % GEL; Apply 1 Act topically 2 (two) times daily.  Eczema, unspecified type -     clobetasol cream (TEMOVATE) 0.05 %; Apply 1 application topically 2 (two) times daily.  Primary osteoarthritis of both shoulders -     meloxicam (MOBIC) 15 MG tablet; Take 1 tablet (15 mg total) by mouth daily.  Need for prophylactic vaccination against Streptococcus pneumoniae (pneumococcus)   I have discontinued Mr. Marlow BaarsSlaine's clobetasol. I have also changed his clobetasol. Additionally, I am having him start on meloxicam. Lastly, I am having him maintain his aspirin EC, atorvastatin, cholecalciferol, Co-Enzyme Q-10, diclofenac sodium, ferrous sulfate, lisinopril, metoprolol tartrate, niacin, sildenafil, vitamin C, citalopram, and clobetasol  cream.  Meds ordered this encounter  Medications  . DISCONTD: clobetasol (TEMOVATE) 0.05 % GEL    Sig: Apply topically.  Marland Kitchen. DISCONTD: clobetasol cream (TEMOVATE) 0.05 %    Sig: Apply 1 application topically 2 (two) times daily.  . clobetasol cream (TEMOVATE) 0.05 %    Sig: Apply 1 application topically 2 (two) times daily.    Dispense:  60 g    Refill:  2  . clobetasol (TEMOVATE) 0.05 % GEL    Sig: Apply 1 Act topically 2 (two) times daily.    Dispense:  60 each    Refill:  2  . meloxicam (MOBIC) 15 MG tablet    Sig: Take 1 tablet (15 mg total) by mouth daily.    Dispense:  90 tablet    Refill:  1     Follow-up: Return in about 6 months (around 11/22/2016).  Sanda Lingerhomas Macyn Remmert, MD

## 2016-05-25 NOTE — Patient Instructions (Signed)
Hypertension Hypertension, commonly called high blood pressure, is when the force of blood pumping through your arteries is too strong. Your arteries are the blood vessels that carry blood from your heart throughout your body. A blood pressure reading consists of a higher number over a lower number, such as 110/72. The higher number (systolic) is the pressure inside your arteries when your heart pumps. The lower number (diastolic) is the pressure inside your arteries when your heart relaxes. Ideally you want your blood pressure below 120/80. Hypertension forces your heart to work harder to pump blood. Your arteries may become narrow or stiff. Having untreated or uncontrolled hypertension can cause heart attack, stroke, kidney disease, and other problems. What increases the risk? Some risk factors for high blood pressure are controllable. Others are not. Risk factors you cannot control include:  Race. You may be at higher risk if you are African American.  Age. Risk increases with age.  Gender. Men are at higher risk than women before age 45 years. After age 65, women are at higher risk than men. Risk factors you can control include:  Not getting enough exercise or physical activity.  Being overweight.  Getting too much fat, sugar, calories, or salt in your diet.  Drinking too much alcohol. What are the signs or symptoms? Hypertension does not usually cause signs or symptoms. Extremely high blood pressure (hypertensive crisis) may cause headache, anxiety, shortness of breath, and nosebleed. How is this diagnosed? To check if you have hypertension, your health care provider will measure your blood pressure while you are seated, with your arm held at the level of your heart. It should be measured at least twice using the same arm. Certain conditions can cause a difference in blood pressure between your right and left arms. A blood pressure reading that is higher than normal on one occasion does  not mean that you need treatment. If it is not clear whether you have high blood pressure, you may be asked to return on a different day to have your blood pressure checked again. Or, you may be asked to monitor your blood pressure at home for 1 or more weeks. How is this treated? Treating high blood pressure includes making lifestyle changes and possibly taking medicine. Living a healthy lifestyle can help lower high blood pressure. You may need to change some of your habits. Lifestyle changes may include:  Following the DASH diet. This diet is high in fruits, vegetables, and whole grains. It is low in salt, red meat, and added sugars.  Keep your sodium intake below 2,300 mg per day.  Getting at least 30-45 minutes of aerobic exercise at least 4 times per week.  Losing weight if necessary.  Not smoking.  Limiting alcoholic beverages.  Learning ways to reduce stress. Your health care provider may prescribe medicine if lifestyle changes are not enough to get your blood pressure under control, and if one of the following is true:  You are 18-59 years of age and your systolic blood pressure is above 140.  You are 60 years of age or older, and your systolic blood pressure is above 150.  Your diastolic blood pressure is above 90.  You have diabetes, and your systolic blood pressure is over 140 or your diastolic blood pressure is over 90.  You have kidney disease and your blood pressure is above 140/90.  You have heart disease and your blood pressure is above 140/90. Your personal target blood pressure may vary depending on your medical   conditions, your age, and other factors. Follow these instructions at home:  Have your blood pressure rechecked as directed by your health care provider.  Take medicines only as directed by your health care provider. Follow the directions carefully. Blood pressure medicines must be taken as prescribed. The medicine does not work as well when you skip  doses. Skipping doses also puts you at risk for problems.  Do not smoke.  Monitor your blood pressure at home as directed by your health care provider. Contact a health care provider if:  You think you are having a reaction to medicines taken.  You have recurrent headaches or feel dizzy.  You have swelling in your ankles.  You have trouble with your vision. Get help right away if:  You develop a severe headache or confusion.  You have unusual weakness, numbness, or feel faint.  You have severe chest or abdominal pain.  You vomit repeatedly.  You have trouble breathing. This information is not intended to replace advice given to you by your health care provider. Make sure you discuss any questions you have with your health care provider. Document Released: 03/28/2005 Document Revised: 09/03/2015 Document Reviewed: 01/18/2013 Elsevier Interactive Patient Education  2017 Elsevier Inc.  

## 2016-05-26 ENCOUNTER — Other Ambulatory Visit (INDEPENDENT_AMBULATORY_CARE_PROVIDER_SITE_OTHER): Payer: Medicare Other

## 2016-05-26 DIAGNOSIS — I10 Essential (primary) hypertension: Secondary | ICD-10-CM

## 2016-05-26 DIAGNOSIS — N400 Enlarged prostate without lower urinary tract symptoms: Secondary | ICD-10-CM

## 2016-05-26 DIAGNOSIS — I2510 Atherosclerotic heart disease of native coronary artery without angina pectoris: Secondary | ICD-10-CM

## 2016-05-26 DIAGNOSIS — R7303 Prediabetes: Secondary | ICD-10-CM | POA: Diagnosis not present

## 2016-05-26 DIAGNOSIS — E785 Hyperlipidemia, unspecified: Secondary | ICD-10-CM

## 2016-05-26 DIAGNOSIS — D500 Iron deficiency anemia secondary to blood loss (chronic): Secondary | ICD-10-CM | POA: Diagnosis not present

## 2016-05-26 LAB — IBC PANEL
IRON: 86 ug/dL (ref 42–165)
Saturation Ratios: 25.9 % (ref 20.0–50.0)
Transferrin: 237 mg/dL (ref 212.0–360.0)

## 2016-05-26 LAB — CBC WITH DIFFERENTIAL/PLATELET
BASOS ABS: 0 10*3/uL (ref 0.0–0.1)
Basophils Relative: 0.6 % (ref 0.0–3.0)
EOS ABS: 0.3 10*3/uL (ref 0.0–0.7)
Eosinophils Relative: 4.2 % (ref 0.0–5.0)
HEMATOCRIT: 41.9 % (ref 39.0–52.0)
Hemoglobin: 14.3 g/dL (ref 13.0–17.0)
Lymphs Abs: 0.4 10*3/uL — ABNORMAL LOW (ref 0.7–4.0)
MCHC: 34.1 g/dL (ref 30.0–36.0)
MCV: 98.4 fl (ref 78.0–100.0)
Monocytes Absolute: 0.7 10*3/uL (ref 0.1–1.0)
Monocytes Relative: 9 % (ref 3.0–12.0)
NEUTROS ABS: 6.6 10*3/uL (ref 1.4–7.7)
NEUTROS PCT: 80.8 % — AB (ref 43.0–77.0)
PLATELETS: 140 10*3/uL — AB (ref 150.0–400.0)
RBC: 4.26 Mil/uL (ref 4.22–5.81)
RDW: 14.6 % (ref 11.5–15.5)
WBC: 8.2 10*3/uL (ref 4.0–10.5)

## 2016-05-26 LAB — COMPREHENSIVE METABOLIC PANEL
ALBUMIN: 4.3 g/dL (ref 3.5–5.2)
ALT: 13 U/L (ref 0–53)
AST: 18 U/L (ref 0–37)
Alkaline Phosphatase: 86 U/L (ref 39–117)
BILIRUBIN TOTAL: 0.8 mg/dL (ref 0.2–1.2)
BUN: 19 mg/dL (ref 6–23)
CALCIUM: 9.6 mg/dL (ref 8.4–10.5)
CHLORIDE: 110 meq/L (ref 96–112)
CO2: 27 mEq/L (ref 19–32)
CREATININE: 1.27 mg/dL (ref 0.40–1.50)
GFR: 59.13 mL/min — AB (ref >60.00)
Glucose, Bld: 124 mg/dL — ABNORMAL HIGH (ref 70–99)
Potassium: 4.8 mEq/L (ref 3.5–5.1)
Sodium: 143 mEq/L (ref 135–145)
Total Protein: 6.6 g/dL (ref 6.0–8.3)

## 2016-05-26 LAB — LIPID PANEL
CHOL/HDL RATIO: 2
CHOLESTEROL: 144 mg/dL (ref 0–200)
HDL: 62.8 mg/dL (ref >39.00)
LDL CALC: 62 mg/dL (ref 0–99)
NonHDL: 81.41
TRIGLYCERIDES: 98 mg/dL (ref 0.0–149.0)
VLDL: 19.6 mg/dL (ref 0.0–40.0)

## 2016-05-26 LAB — FERRITIN: FERRITIN: 189.5 ng/mL (ref 22.0–322.0)

## 2016-05-26 LAB — HEMOGLOBIN A1C: HEMOGLOBIN A1C: 5.7 % (ref 4.6–6.5)

## 2016-05-26 LAB — PSA: PSA: 2.4 ng/mL (ref 0.10–4.00)

## 2016-05-26 LAB — TSH: TSH: 2.94 u[IU]/mL (ref 0.35–4.50)

## 2016-05-29 ENCOUNTER — Encounter: Payer: Self-pay | Admitting: Internal Medicine

## 2016-05-30 ENCOUNTER — Encounter: Payer: Self-pay | Admitting: Internal Medicine

## 2016-07-23 ENCOUNTER — Other Ambulatory Visit: Payer: Self-pay | Admitting: Cardiology

## 2016-07-25 NOTE — Telephone Encounter (Signed)
This should be filled by primary care  Kamarah Bilotta MD, FACC   

## 2016-07-26 NOTE — Addendum Note (Signed)
Addended by: Neoma Laming on: 07/26/2016 04:18 PM   Modules accepted: Orders

## 2016-07-28 ENCOUNTER — Encounter: Payer: Self-pay | Admitting: Cardiology

## 2016-08-02 ENCOUNTER — Other Ambulatory Visit: Payer: Self-pay

## 2016-08-02 MED ORDER — CITALOPRAM HYDROBROMIDE 10 MG PO TABS: 10.0000 mg | ORAL_TABLET | Freq: Every day | ORAL | 0 refills | Status: DC

## 2016-08-03 ENCOUNTER — Encounter: Payer: Self-pay | Admitting: Internal Medicine

## 2016-08-04 MED ORDER — ATORVASTATIN CALCIUM 40 MG PO TABS: 40.0000 mg | ORAL_TABLET | Freq: Every day | ORAL | 3 refills | Status: DC

## 2016-08-09 ENCOUNTER — Encounter: Payer: Self-pay | Admitting: Internal Medicine

## 2016-08-09 ENCOUNTER — Other Ambulatory Visit: Payer: Self-pay | Admitting: Internal Medicine

## 2016-08-09 DIAGNOSIS — L438 Other lichen planus: Secondary | ICD-10-CM

## 2016-08-09 DIAGNOSIS — N5201 Erectile dysfunction due to arterial insufficiency: Secondary | ICD-10-CM

## 2016-08-09 DIAGNOSIS — L309 Dermatitis, unspecified: Secondary | ICD-10-CM

## 2016-08-09 HISTORY — DX: Erectile dysfunction due to arterial insufficiency: N52.01

## 2016-08-09 MED ORDER — CLOBETASOL PROPIONATE 0.05 % EX CREA: 1.0000 "application " | TOPICAL_CREAM | Freq: Two times a day (BID) | CUTANEOUS | 3 refills | Status: DC

## 2016-08-09 MED ORDER — SILDENAFIL CITRATE 100 MG PO TABS: 100.0000 mg | ORAL_TABLET | Freq: Every day | ORAL | 11 refills | Status: DC | PRN

## 2016-08-25 ENCOUNTER — Encounter: Payer: Self-pay | Admitting: Internal Medicine

## 2016-08-25 MED ORDER — LISINOPRIL 20 MG PO TABS: 20.0000 mg | ORAL_TABLET | Freq: Every day | ORAL | 1 refills | Status: DC

## 2016-09-06 ENCOUNTER — Encounter: Payer: Self-pay | Admitting: Internal Medicine

## 2016-09-06 MED ORDER — CITALOPRAM HYDROBROMIDE 10 MG PO TABS: 10.0000 mg | ORAL_TABLET | Freq: Every day | ORAL | 1 refills | Status: DC

## 2016-09-07 ENCOUNTER — Encounter: Payer: Self-pay | Admitting: Internal Medicine

## 2016-09-07 MED ORDER — METOPROLOL TARTRATE 25 MG PO TABS: 12.5000 mg | ORAL_TABLET | Freq: Two times a day (BID) | ORAL | 1 refills | Status: DC

## 2016-11-21 ENCOUNTER — Other Ambulatory Visit: Payer: Self-pay | Admitting: Internal Medicine

## 2016-11-21 ENCOUNTER — Encounter: Payer: Self-pay | Admitting: Cardiology

## 2016-11-21 ENCOUNTER — Encounter: Payer: Self-pay | Admitting: Internal Medicine

## 2016-11-21 DIAGNOSIS — M19011 Primary osteoarthritis, right shoulder: Secondary | ICD-10-CM

## 2016-11-21 DIAGNOSIS — M19012 Primary osteoarthritis, left shoulder: Principal | ICD-10-CM

## 2016-11-21 MED ORDER — MELOXICAM 15 MG PO TABS: 15.0000 mg | ORAL_TABLET | Freq: Every day | ORAL | 0 refills | Status: DC

## 2016-11-22 NOTE — Progress Notes (Signed)
Cardiology Office Note    Date:  11/24/2016   ID:  Bruce Meinders., DOB 06/08/1945, MRN [id number]  PCP:  Etta Grandchild, MD  Cardiologist:  Jalon Blackwelder [place], MD    History of Present Illness:  Bruce Angerer. is a 71 y.o. male seen for follow up CAD. He has a history of CAD s/p CABG in 2008 in [place] for failed PCI. This was a LIMA to the LAD. In 2013 he had PCI of a tortuous RCA. In April 2016 he had a low risk Cardiolite without evidence of ischemia. He has a history of HTN and dyslipidemia. 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  He moved to [place] to be closer to family. Lives at [facility] with his wife. 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 He works at [employer] for [employer]. Stays very  Busy and does a lot of walking. He denies any chest pain, SOB, palpitations, dizziness.     Past Medical History:  Diagnosis Date  . Chronic ischemic heart disease   . Coronary artery disease   . Hyperlipidemia   . Hypertension   . IFG (impaired fasting glucose)   . Osteoarthritis     Past Surgical History:  Procedure Laterality Date  . CARDIAC CATHETERIZATION      Current Medications: Outpatient Medications Prior to Visit  Medication Sig Dispense Refill  . Ascorbic Acid (VITAMIN C) 100 MG tablet Take 100 mg by mouth daily.    Marland Kitchen aspirin EC 81 MG tablet Take 81 mg by mouth daily.    Marland Kitchen atorvastatin (LIPITOR) 40 MG tablet Take 1 tablet (40 mg total) by mouth daily. 90 tablet 3  . cholecalciferol (VITAMIN D) 400 units TABS tablet Take 400 Units by mouth daily.    . citalopram (CELEXA) 10 MG tablet Take 1 tablet (10 mg total) by mouth daily. 90 tablet 1  . clobetasol cream (TEMOVATE) 0.05 % Apply 1 application topically 2 (two) times daily. 60 g 3  . Co-Enzyme Q-10 100 MG CAPS Take 10 mg by mouth daily.    . diclofenac sodium (VOLTAREN) 1 % GEL Apply 2 g topically 4 (four) times daily.    . ferrous sulfate 325 (65 FE) MG tablet Take  325 mg by mouth daily with breakfast.    . lisinopril (PRINIVIL,ZESTRIL) 20 MG tablet Take 1 tablet (20 mg total) by mouth daily. 90 tablet 1  . meloxicam (MOBIC) 15 MG tablet Take 1 tablet (15 mg total) by mouth daily. 90 tablet 0  . metoprolol tartrate (LOPRESSOR) 25 MG tablet Take 0.5 tablets (12.5 mg total) by mouth 2 (two) times daily. 90 tablet 1  . sildenafil (VIAGRA) 100 MG tablet Take 1 tablet (100 mg total) by mouth daily as needed for erectile dysfunction. 10 tablet 11  . niacin 500 MG tablet Take 500 mg by mouth at bedtime.     No facility-administered medications prior to visit.      Allergies:   Patient has no known allergies.   Social History   Social History  . Marital status: Married    Spouse name: N/A  . Number of children: N/A  . Years of education: N/A   Social History Main Topics  . Smoking status: Former Games developer  . Smokeless tobacco: Never Used  . Alcohol use No  . Drug use: No  . Sexual activity: Yes    Birth control/ protection: None   Other Topics Concern  .  None   Social History Narrative  . None     Family History:  The patient's family history includes Cancer in his father; Early death (age of onset: 8630) in his brother; Early death (age of onset: 4840) in his brother; Heart disease in his father and mother; Hypertension in his mother; Stroke in his brother.   ROS:   Please see the history of present illness.    ROS All other systems reviewed and are negative.   PHYSICAL EXAM:   VS:  BP 136/78   Pulse 62   Ht 5\' 5"  (1.651 m)   Wt 136 lb 3.2 oz (61.8 kg)   SpO2 96%   BMI 22.66 kg/m    GEN: Well nourished, well developed, in no acute distress  HEENT:  PERRL, EOMI, sclera are clear. Oropharynx is clear. NECK:  No jugular venous distention, carotid upstroke brisk and symmetric, no bruits, no thyromegaly or adenopathy LUNGS:  Clear to auscultation bilaterally CHEST:  Unremarkable HEART:  RRR,  PMI not displaced or sustained,S1 and S2 within  normal limits, no S3, no S4: no clicks, no rubs, no murmurs ABD:  Soft, nontender. BS +, no masses or bruits. No hepatomegaly, no splenomegaly EXT:  2 + pulses throughout, no edema, no cyanosis no clubbing SKIN:  Warm and dry.  No rashes NEURO:  Alert and oriented x 3. Cranial nerves II through XII intact. PSYCH:  Cognitively intact    Wt Readings from Last 3 Encounters:  11/24/16 136 lb 3.2 oz (61.8 kg)  05/25/16 140 lb (63.5 kg)  05/13/16 143 lb (64.9 kg)      Studies/Labs Reviewed:   EKG:  EKG is not ordered today.     Recent Labs: 05/26/2016: ALT 13; BUN 19; Creatinine, Ser 1.27; Hemoglobin 14.3; Platelets 140.0; Potassium 4.8; Sodium 143; TSH 2.94   Lipid Panel    Component Value Date/Time   CHOL 144 05/26/2016 0912   TRIG 98.0 05/26/2016 0912   HDL 62.80 05/26/2016 0912   CHOLHDL 2 05/26/2016 0912   VLDL 19.6 05/26/2016 0912   LDLCALC 62 05/26/2016 0912    Additional studies/ records that were reviewed today include:  Labs dated 08/24/15: glucose 177, creatinine 1.47. Otherwise CMET normal. Platelets 118K, otherwise CBC is normal. A1c 5.9%. TSH normal. Cholesterol 170, triglycerides 119, LDL 85, HDL 61. LDL particle number [id number]. Dated 05/26/16: cholesterol 144, triglycerides 98, HDL 62, LDL 62. A1c 5.7%. Creatinine 1.27. Hgb, TSH, ALT normal.  ASSESSMENT:    1.  Coronary artery disease of native artery of native heart with stable angina pectoris (HCC)   2. CKD (chronic kidney disease) stage 3, GFR 30-59 ml/min   3. Pure hypercholesterolemia   4. Essential hypertension      PLAN:  In order of problems listed above:  1. Patient is doing very well from a cardiac standpoint. He is asymptomatic and on good medical therapy. Sugars and cholesterol under good control.  Will continue  medications. Follow up in one year.    Medication Adjustments/Labs and Tests Ordered: Current medicines are reviewed at length with the patient today.  Concerns regarding medicines are  outlined above.  Medication changes, Labs and Tests ordered today are listed in the Patient Instructions below. Patient Instructions  Continue your current therapy  I will see you in one  Year      Signed, Mallie Giambra [place], MD  11/24/2016 8:42 AM    [facility] [address], [place], [place], [id number] [phone]

## 2016-11-24 ENCOUNTER — Encounter: Payer: Self-pay | Admitting: Cardiology

## 2016-11-24 ENCOUNTER — Ambulatory Visit (INDEPENDENT_AMBULATORY_CARE_PROVIDER_SITE_OTHER): Payer: Medicare Other | Admitting: Cardiology

## 2016-11-24 VITALS — BP 136/78 | HR 62 | Ht 65.0 in | Wt 136.2 lb

## 2016-11-24 DIAGNOSIS — E7800 Pure hypercholesterolemia, unspecified: Secondary | ICD-10-CM | POA: Diagnosis not present

## 2016-11-24 DIAGNOSIS — I25118 Atherosclerotic heart disease of native coronary artery with other forms of angina pectoris: Secondary | ICD-10-CM | POA: Diagnosis not present

## 2016-11-24 DIAGNOSIS — I10 Essential (primary) hypertension: Secondary | ICD-10-CM | POA: Diagnosis not present

## 2016-11-24 DIAGNOSIS — I209 Angina pectoris, unspecified: Secondary | ICD-10-CM | POA: Diagnosis not present

## 2016-11-24 DIAGNOSIS — I2510 Atherosclerotic heart disease of native coronary artery without angina pectoris: Secondary | ICD-10-CM

## 2016-11-24 DIAGNOSIS — N183 Chronic kidney disease, stage 3 unspecified: Secondary | ICD-10-CM

## 2016-11-24 NOTE — Patient Instructions (Addendum)
Continue your current therapy  I will see you in one  Year. 

## 2016-12-13 ENCOUNTER — Ambulatory Visit: Payer: Self-pay | Admitting: Internal Medicine

## 2016-12-15 ENCOUNTER — Ambulatory Visit: Payer: Medicare Other | Admitting: Cardiology

## 2016-12-26 ENCOUNTER — Encounter: Payer: Self-pay | Admitting: Internal Medicine

## 2016-12-26 ENCOUNTER — Ambulatory Visit (INDEPENDENT_AMBULATORY_CARE_PROVIDER_SITE_OTHER): Payer: Medicare Other | Admitting: Internal Medicine

## 2016-12-26 ENCOUNTER — Other Ambulatory Visit (INDEPENDENT_AMBULATORY_CARE_PROVIDER_SITE_OTHER): Payer: Medicare Other

## 2016-12-26 VITALS — BP 130/68 | HR 50 | Temp 97.8°F | Resp 16 | Ht 65.0 in | Wt 139.2 lb

## 2016-12-26 DIAGNOSIS — D500 Iron deficiency anemia secondary to blood loss (chronic): Secondary | ICD-10-CM

## 2016-12-26 DIAGNOSIS — I2510 Atherosclerotic heart disease of native coronary artery without angina pectoris: Secondary | ICD-10-CM | POA: Diagnosis not present

## 2016-12-26 DIAGNOSIS — I10 Essential (primary) hypertension: Secondary | ICD-10-CM | POA: Diagnosis not present

## 2016-12-26 DIAGNOSIS — Z23 Encounter for immunization: Secondary | ICD-10-CM | POA: Diagnosis not present

## 2016-12-26 LAB — CBC WITH DIFFERENTIAL/PLATELET
BASOS ABS: 0.1 10*3/uL (ref 0.0–0.1)
Basophils Relative: 0.7 % (ref 0.0–3.0)
EOS PCT: 4.7 % (ref 0.0–5.0)
Eosinophils Absolute: 0.4 10*3/uL (ref 0.0–0.7)
HEMATOCRIT: 39.7 % (ref 39.0–52.0)
Hemoglobin: 13.6 g/dL (ref 13.0–17.0)
LYMPHS ABS: 0.5 10*3/uL — AB (ref 0.7–4.0)
Lymphocytes Relative: 7.3 % — ABNORMAL LOW (ref 12.0–46.0)
MCHC: 34.2 g/dL (ref 30.0–36.0)
MCV: 102.5 fl — ABNORMAL HIGH (ref 78.0–100.0)
MONOS PCT: 8 % (ref 3.0–12.0)
Monocytes Absolute: 0.6 10*3/uL (ref 0.1–1.0)
NEUTROS ABS: 5.9 10*3/uL (ref 1.4–7.7)
NEUTROS PCT: 79.3 % — AB (ref 43.0–77.0)
Platelets: 149 10*3/uL — ABNORMAL LOW (ref 150.0–400.0)
RBC: 3.88 Mil/uL — AB (ref 4.22–5.81)
RDW: 14.4 % (ref 11.5–15.5)
WBC: 7.4 10*3/uL (ref 4.0–10.5)

## 2016-12-26 LAB — BASIC METABOLIC PANEL
BUN: 18 mg/dL (ref 6–23)
CALCIUM: 9.4 mg/dL (ref 8.4–10.5)
CHLORIDE: 108 meq/L (ref 96–112)
CO2: 26 meq/L (ref 19–32)
CREATININE: 1.35 mg/dL (ref 0.40–1.50)
GFR: 55.01 mL/min — ABNORMAL LOW (ref >60.00)
Glucose, Bld: 106 mg/dL — ABNORMAL HIGH (ref 70–99)
Potassium: 4.6 mEq/L (ref 3.5–5.1)
Sodium: 140 mEq/L (ref 135–145)

## 2016-12-26 NOTE — Patient Instructions (Signed)

## 2016-12-26 NOTE — Progress Notes (Signed)
Subjective:  Patient ID: Bruce Jordan., male    DOB: [birth year]  Age: 71 y.o. MRN: [id number]  CC: Hypertension and Anemia   HPI [name]. presents for f/up - he feels well and offers no complaints.  Outpatient Medications Prior to Visit  Medication Sig Dispense Refill  . Ascorbic Acid (VITAMIN C) 100 MG tablet Take 100 mg by mouth daily.    Marland Kitchen aspirin EC 81 MG tablet Take 81 mg by mouth daily.    Marland Kitchen atorvastatin (LIPITOR) 40 MG tablet Take 1 tablet (40 mg total) by mouth daily. 90 tablet 3  . cholecalciferol (VITAMIN D) 400 units TABS tablet Take 400 Units by mouth daily.    . citalopram (CELEXA) 10 MG tablet Take 1 tablet (10 mg total) by mouth daily. 90 tablet 1  . clobetasol cream (TEMOVATE) 0.05 % Apply 1 application topically 2 (two) times daily. 60 g 3  . Co-Enzyme Q-10 100 MG CAPS Take 10 mg by mouth daily.    . diclofenac sodium (VOLTAREN) 1 % GEL Apply 2 g topically 4 (four) times daily.    . ferrous sulfate 325 (65 FE) MG tablet Take 325 mg by mouth daily with breakfast.    . lisinopril (PRINIVIL,ZESTRIL) 20 MG tablet Take 1 tablet (20 mg total) by mouth daily. 90 tablet 1  . meloxicam (MOBIC) 15 MG tablet Take 1 tablet (15 mg total) by mouth daily. 90 tablet 0  . metoprolol tartrate (LOPRESSOR) 25 MG tablet Take 0.5 tablets (12.5 mg total) by mouth 2 (two) times daily. 90 tablet 1  . sildenafil (VIAGRA) 100 MG tablet Take 1 tablet (100 mg total) by mouth daily as needed for erectile dysfunction. 10 tablet 11   No facility-administered medications prior to visit.     ROS Review of Systems  Constitutional: Negative.  Negative for chills, diaphoresis, fatigue and fever.  HENT: Negative.   Eyes: Negative.   Respiratory: Negative for cough, chest tightness, shortness of breath and wheezing.   Cardiovascular: Negative for chest pain, palpitations and leg swelling.  Gastrointestinal: Negative for abdominal pain, constipation, diarrhea, nausea and vomiting.    Endocrine: Negative.   Genitourinary: Negative.  Negative for difficulty urinating, dysuria and urgency.  Musculoskeletal: Negative.  Negative for arthralgias and myalgias.  Skin: Negative.  Negative for color change and rash.  Allergic/Immunologic: Negative.   Neurological: Negative.  Negative for dizziness, weakness and light-headedness.  Hematological: Negative for adenopathy. Does not bruise/bleed easily.  Psychiatric/Behavioral: Negative.     Objective:  BP 130/68 (BP Location: Left Arm, Patient Position: Sitting, Cuff Size: Normal)   Pulse (!) 50   Temp 97.8 F (36.6 C) (Oral)   Resp 16   Ht  (1.651 m)   Wt 139 lb 4 oz (63.2 kg)   SpO2 99%   BMI 23.17 kg/m   BP Readings from Last 3 Encounters:  12/26/16 130/68  11/24/16 136/78  05/25/16 (!) 110/50    Wt Readings from Last 3 Encounters:  12/26/16 139 lb 4 oz (63.2 kg)  11/24/16 136 lb 3.2 oz (61.8 kg)  05/25/16 140 lb (63.5 kg)    Physical Exam  Constitutional: He is oriented to person, place, and time. No distress.  HENT:  Mouth/Throat: Oropharynx is clear and moist. No oropharyngeal exudate.  Eyes: Conjunctivae are normal. Right eye exhibits no discharge. Left eye exhibits no discharge. No scleral icterus.  Neck: Normal range of motion. Neck supple. No JVD present. No thyromegaly present.  Cardiovascular:  Normal rate, regular rhythm and intact distal pulses.  Exam reveals no gallop and no friction rub.   No murmur heard. Pulmonary/Chest: Effort normal and breath sounds normal. No respiratory distress. He has no wheezes. He has no rales. He exhibits no tenderness.  Abdominal: Soft. Bowel sounds are normal. He exhibits no distension and no mass. There is no tenderness. There is no rebound and no guarding.  Musculoskeletal: Normal range of motion. He exhibits no edema, tenderness or deformity.  Lymphadenopathy:    He has no cervical adenopathy.  Neurological: He is alert and oriented to person, place, and  time.  Skin: Skin is warm and dry. No rash noted. He is not diaphoretic. No erythema. No pallor.  Vitals reviewed.   Lab Results  Component Value Date   WBC 7.4 12/26/2016   HGB 13.6 12/26/2016   HCT 39.7 12/26/2016   PLT 149.0 (L) 12/26/2016   GLUCOSE 106 (H) 12/26/2016   CHOL 144 05/26/2016   TRIG 98.0 05/26/2016   HDL 62.80 05/26/2016   LDLCALC 62 05/26/2016   ALT 13 05/26/2016   AST 18 05/26/2016   NA 140 12/26/2016   K 4.6 12/26/2016   CL 108 12/26/2016   CREATININE 1.35 12/26/2016   BUN 18 12/26/2016   CO2 26 12/26/2016   TSH 2.94 05/26/2016   PSA 2.40 05/26/2016   HGBA1C 5.7 05/26/2016    Patient was never admitted.  Assessment & Plan:   Alexi was seen today for hypertension and anemia.  Diagnoses and all orders for this visit:  Essential hypertension- his BP is well controlled, lytes and renal fxn are normal -     Basic metabolic panel; Future  Iron deficiency anemia due to chronic blood loss- his H/H remain normal, he has a mild yet stable abnormality on his diff with a slight increase in neutrophils and decrease in lymphocytes, this is a benign finding, will cont to monitor him for signs of lymphoproliferative disease -     CBC with Differential/Platelet; Future  Need for influenza vaccination -     Flu vaccine HIGH DOSE PF (Fluzone High dose)  Need for Tdap vaccination -     Tdap vaccine greater than or equal to 7yo IM   I am having Mr. Curenton maintain his aspirin EC, cholecalciferol, Co-Enzyme Q-10, diclofenac sodium, ferrous sulfate, vitamin C, atorvastatin, sildenafil, clobetasol cream, lisinopril, citalopram, metoprolol tartrate, and meloxicam.  No orders of the defined types were placed in this encounter.    Follow-up: Return in about 6 months (around 06/25/2017).  Sanda Linger, MD

## 2017-02-17 ENCOUNTER — Other Ambulatory Visit: Payer: Self-pay | Admitting: Internal Medicine

## 2017-02-17 DIAGNOSIS — M19012 Primary osteoarthritis, left shoulder: Principal | ICD-10-CM

## 2017-02-17 DIAGNOSIS — M19011 Primary osteoarthritis, right shoulder: Secondary | ICD-10-CM

## 2017-02-17 NOTE — Telephone Encounter (Signed)
Refill denied. Pt has never been seen by dr Katrinka Blazingsmith.

## 2017-02-22 ENCOUNTER — Encounter: Payer: Self-pay | Admitting: Internal Medicine

## 2017-02-22 MED ORDER — LISINOPRIL 20 MG PO TABS: 20.0000 mg | ORAL_TABLET | Freq: Every day | ORAL | 1 refills | Status: DC

## 2017-02-23 ENCOUNTER — Other Ambulatory Visit: Payer: Self-pay | Admitting: Internal Medicine

## 2017-02-23 DIAGNOSIS — M19012 Primary osteoarthritis, left shoulder: Principal | ICD-10-CM

## 2017-02-23 DIAGNOSIS — M19011 Primary osteoarthritis, right shoulder: Secondary | ICD-10-CM

## 2017-02-23 MED ORDER — MELOXICAM 15 MG PO TABS: 15.0000 mg | ORAL_TABLET | Freq: Every day | ORAL | 1 refills | Status: DC

## 2017-03-05 ENCOUNTER — Encounter: Payer: Self-pay | Admitting: Internal Medicine

## 2017-03-06 MED ORDER — CITALOPRAM HYDROBROMIDE 10 MG PO TABS: 10.0000 mg | ORAL_TABLET | Freq: Every day | ORAL | 1 refills | Status: DC

## 2017-03-13 ENCOUNTER — Encounter: Payer: Self-pay | Admitting: Internal Medicine

## 2017-03-13 MED ORDER — METOPROLOL TARTRATE 25 MG PO TABS: 12.5000 mg | ORAL_TABLET | Freq: Two times a day (BID) | ORAL | 1 refills | Status: DC

## 2017-05-13 ENCOUNTER — Encounter (HOSPITAL_COMMUNITY): Payer: Self-pay | Admitting: Emergency Medicine

## 2017-05-13 ENCOUNTER — Emergency Department (HOSPITAL_COMMUNITY)
Admission: EM | Admit: 2017-05-13 | Discharge: 2017-05-13 | Disposition: A | Discharge status: Home / Self Care | Payer: Medicare Other | Attending: Emergency Medicine | Admitting: Emergency Medicine

## 2017-05-13 DIAGNOSIS — T7840XA Allergy, unspecified, initial encounter: Secondary | ICD-10-CM | POA: Diagnosis present

## 2017-05-13 DIAGNOSIS — Z87891 Personal history of nicotine dependence: Secondary | ICD-10-CM | POA: Diagnosis not present

## 2017-05-13 DIAGNOSIS — T782XXA Anaphylactic shock, unspecified, initial encounter: Secondary | ICD-10-CM | POA: Insufficient documentation

## 2017-05-13 DIAGNOSIS — I119 Hypertensive heart disease without heart failure: Secondary | ICD-10-CM | POA: Insufficient documentation

## 2017-05-13 DIAGNOSIS — I2510 Atherosclerotic heart disease of native coronary artery without angina pectoris: Secondary | ICD-10-CM | POA: Diagnosis not present

## 2017-05-13 DIAGNOSIS — Z79899 Other long term (current) drug therapy: Secondary | ICD-10-CM | POA: Diagnosis not present

## 2017-05-13 DIAGNOSIS — Z7982 Long term (current) use of aspirin: Secondary | ICD-10-CM | POA: Diagnosis not present

## 2017-05-13 MED ORDER — PREDNISONE 20 MG PO TABS
60.0000 mg | ORAL_TABLET | Freq: Once | ORAL | Status: AC
  Administered 2017-05-13: 60 mg via ORAL
  Filled 2017-05-13: qty 3

## 2017-05-13 MED ORDER — DIPHENHYDRAMINE HCL 25 MG PO CAPS
25.0000 mg | ORAL_CAPSULE | Freq: Once | ORAL | Status: AC
  Administered 2017-05-13: 25 mg via ORAL
  Filled 2017-05-13: qty 1

## 2017-05-13 MED ORDER — CETIRIZINE HCL 10 MG PO CAPS: 10.0000 mg | ORAL_CAPSULE | Freq: Every day | ORAL | 0 refills | Status: DC

## 2017-05-13 MED ORDER — EPINEPHRINE 0.3 MG/0.3ML IJ SOAJ: 0.3000 mg | Freq: Once | INTRAMUSCULAR | 0 refills | Status: DC

## 2017-05-13 MED ORDER — EPINEPHRINE 0.3 MG/0.3ML IJ SOAJ
INTRAMUSCULAR | Status: AC
  Administered 2017-05-13: 0.3 mg
  Filled 2017-05-13: qty 0.3

## 2017-05-13 MED ORDER — FAMOTIDINE 20 MG PO TABS
20.0000 mg | ORAL_TABLET | Freq: Once | ORAL | Status: AC
  Administered 2017-05-13: 20 mg via ORAL
  Filled 2017-05-13: qty 1

## 2017-05-13 NOTE — ED Provider Notes (Signed)
[facility] EMERGENCY DEPARTMENT Provider Note   CSN: [id number] Arrival date & time: 05/13/17  1800     History   Chief Complaint Chief Complaint  Patient presents with  . Allergic Reaction    HPI Bruce Jordan. is a 72 y.o. male.  Patient was at [employer] walking through the store and noticed his right eye was itchy.  His wife then notes it was swelling.  Shortly afterwards, the patient began to have feeling of constriction to his airway and was uncomfortable swallowing.  He has been maintaining his secretions.    Allergic Reaction  Presenting symptoms: difficulty breathing, difficulty swallowing, itching and swelling   Severity:  Moderate Duration:  1 hour Prior allergic episodes:  No prior episodes Context: not chemicals and not medications   Relieved by:  None tried Ineffective treatments:  None tried   Past Medical History:  Diagnosis Date  . Chronic ischemic heart disease   . Coronary artery disease   . Hyperlipidemia   . Hypertension   . IFG (impaired fasting glucose)   . Osteoarthritis     Patient Active Problem List   Diagnosis Date Noted  . Erectile dysfunction due to arterial insufficiency 08/09/2016  . Coronary artery disease involving native coronary artery of native heart without angina pectoris 05/25/2016  . Hyperlipidemia with target LDL less than 70 05/25/2016  . Essential hypertension 05/25/2016  . Prediabetes 05/25/2016  . Iron deficiency anemia due to chronic blood loss 05/25/2016  . Benign prostatic hyperplasia without lower urinary tract symptoms 05/25/2016  . 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 Eczema 05/25/2016  . Primary osteoarthritis of both shoulders 05/25/2016    Past Surgical History:  Procedure Laterality Date  . CARDIAC CATHETERIZATION         Home Medications    Prior to Admission medications   Medication Sig Start Date End Date Taking? Authorizing Provider  Ascorbic Acid (VITAMIN C) 100  MG tablet Take 100 mg by mouth daily.    [provider]  aspirin EC 81 MG tablet Take 81 mg by mouth daily.    [provider]  atorvastatin (LIPITOR) 40 MG tablet Take 1 tablet (40 mg total) by mouth daily. 08/04/16   Etta Grandchild, MD  cholecalciferol (VITAMIN D) 400 units TABS tablet Take 400 Units by mouth daily.    [provider]  citalopram (CELEXA) 10 MG tablet Take 1 tablet (10 mg total) by mouth daily. 03/06/17   Etta Grandchild, MD  clobetasol cream (TEMOVATE) 0.05 % Apply 1 application topically 2 (two) times daily. 08/09/16   Etta Grandchild, MD  Co-Enzyme Q-10 100 MG CAPS Take 10 mg by mouth daily.    [provider]  diclofenac sodium (VOLTAREN) 1 % GEL Apply 2 g topically 4 (four) times daily.    [provider]  ferrous sulfate 325 (65 FE) MG tablet Take 325 mg by mouth daily with breakfast.    [provider]  lisinopril (PRINIVIL,ZESTRIL) 20 MG tablet Take 1 tablet (20 mg total) daily by mouth. 02/22/17   Etta Grandchild, MD  meloxicam (MOBIC) 15 MG tablet Take 1 tablet (15 mg total) daily by mouth. 02/23/17   Etta Grandchild, MD  metoprolol tartrate (LOPRESSOR) 25 MG tablet Take 0.5 tablets (12.5 mg total) by mouth 2 (two) times daily. 03/13/17   Etta Grandchild, MD  sildenafil (VIAGRA) 100 MG tablet Take 1 tablet (100 mg total) by mouth daily as  needed for erectile dysfunction. 08/09/16   Etta GrandchildJones, Thomas L, MD    Family History Family History  Problem Relation Age of Onset  . Hypertension Mother   . Heart disease Mother   . Heart disease Father   . Cancer Father   . Early death Brother 30       MVA  . Stroke Brother   . Early death Brother 2140       Epilepsy    Social History Social History   Tobacco Use  . Smoking status: Former Games developermoker  . Smokeless tobacco: Never Used  Substance Use Topics  . Alcohol use: No  . Drug use: No     Allergies   Patient has no known allergies.   Review of Systems Review of  Systems  Unable to perform ROS: Acuity of condition  HENT: Positive for trouble swallowing.   Skin: Positive for itching.     Physical Exam Updated Vital Signs BP (!) 189/81 (BP Location: Right Arm)   Pulse 66   Temp 97.9 F (36.6 C) (Oral)   Resp 16   Ht 5\' 5"  (1.651 m)   Wt 62.6 kg (138 lb)   SpO2 100%   BMI 22.96 kg/m   Physical Exam  Constitutional: He is oriented to person, place, and time. He appears well-developed and well-nourished.  HENT:  Head: Normocephalic and atraumatic.  Eyes: Conjunctivae and EOM are normal. Pupils are equal, round, and reactive to light.  Neck: Neck supple. No tracheal deviation present. No thyromegaly present.  Cardiovascular: Normal rate and regular rhythm.  Pulmonary/Chest: Effort normal and breath sounds normal. No stridor. He has no wheezes.  Abdominal: Soft. There is no tenderness.  Musculoskeletal: He exhibits no edema.  Lymphadenopathy:    He has no cervical adenopathy.  Neurological: He is alert and oriented to person, place, and time.  Skin: Skin is warm and dry.  Psychiatric: He has a normal mood and affect.  Nursing note and vitals reviewed.    ED Treatments / Results  Labs (all labs ordered are listed, but only abnormal results are displayed) Labs Reviewed - No data to display  EKG  EKG Interpretation None       Radiology No results found.  Procedures Procedures (including critical care time)  Medications Ordered in ED Medications  EPINEPHrine (EPI-PEN) 0.3 mg/0.3 mL injection (0.3 mg  Given 05/13/17 1838)     Initial Impression / Assessment and Plan / ED Course  I have reviewed the triage vital signs and the nursing notes.  Pertinent labs & imaging results that were available during my care of the patient were reviewed by me and considered in my medical decision making (see chart for details).     [name] is a 72 year old male with past medical history significant for ischemic heart disease, CAD,  hypertension, hyperlipidemia, eczema who presents for allergic reaction.  Patient has swelling to his right eye and subjective impingement of his airway.  His voice is muffled.  Epinephrine is administered with improvement in symptoms.  Patient is given oral Benadryl, Pepcid, and steroids.  Patient is observed with no recurrence of symptoms.  Patient is discharged home with strict return precautions, follow-up instructions, and educational materials.  He is instructed to take antihistamine therapy for the next 2 days.  He is given prescription for EpiPen.  Final Clinical Impressions(s) / ED Diagnoses   Final diagnoses:  Allergic reaction, initial encounter  Anaphylaxis, initial encounter    ED Discharge Orders  Ordered    Cetirizine HCl (ZYRTEC ALLERGY) 10 MG CAPS  Daily     05/13/17 2111    EPINEPHrine (EPIPEN 2-PAK) 0.3 mg/0.3 mL IJ SOAJ injection   Once     05/13/17 2111       Garey Ham, MD 05/14/17 1610    Blane Ohara, MD 05/14/17 [phone]

## 2017-05-13 NOTE — ED Triage Notes (Signed)
Patient arrived with complaints of swelling to eyes, throat chest tightness anditchy throat

## 2017-08-09 ENCOUNTER — Encounter: Payer: Self-pay | Admitting: Internal Medicine

## 2017-08-15 ENCOUNTER — Encounter: Payer: Self-pay | Admitting: Internal Medicine

## 2017-08-16 MED ORDER — ATORVASTATIN CALCIUM 40 MG PO TABS: 40.0000 mg | ORAL_TABLET | Freq: Every day | ORAL | 1 refills | Status: DC

## 2017-08-17 ENCOUNTER — Encounter: Payer: Self-pay | Admitting: Cardiology

## 2017-08-22 ENCOUNTER — Encounter: Payer: Self-pay | Admitting: Internal Medicine

## 2017-08-22 DIAGNOSIS — M19011 Primary osteoarthritis, right shoulder: Secondary | ICD-10-CM

## 2017-08-22 DIAGNOSIS — M19012 Primary osteoarthritis, left shoulder: Principal | ICD-10-CM

## 2017-08-24 MED ORDER — MELOXICAM 15 MG PO TABS: 15.0000 mg | ORAL_TABLET | Freq: Every day | ORAL | 0 refills | Status: DC

## 2017-08-28 ENCOUNTER — Encounter: Payer: Self-pay | Admitting: Internal Medicine

## 2017-08-28 MED ORDER — LISINOPRIL 20 MG PO TABS: 20.0000 mg | ORAL_TABLET | Freq: Every day | ORAL | 0 refills | Status: DC

## 2017-09-02 ENCOUNTER — Encounter: Payer: Self-pay | Admitting: Internal Medicine

## 2017-09-05 MED ORDER — CITALOPRAM HYDROBROMIDE 10 MG PO TABS: 10.0000 mg | ORAL_TABLET | Freq: Every day | ORAL | 0 refills | Status: DC

## 2017-09-12 ENCOUNTER — Encounter: Payer: Self-pay | Admitting: Internal Medicine

## 2017-09-12 ENCOUNTER — Ambulatory Visit (INDEPENDENT_AMBULATORY_CARE_PROVIDER_SITE_OTHER): Payer: Medicare Other | Admitting: Internal Medicine

## 2017-09-12 ENCOUNTER — Other Ambulatory Visit (INDEPENDENT_AMBULATORY_CARE_PROVIDER_SITE_OTHER): Payer: Medicare Other

## 2017-09-12 ENCOUNTER — Ambulatory Visit (INDEPENDENT_AMBULATORY_CARE_PROVIDER_SITE_OTHER)
Admission: RE | Admit: 2017-09-12 | Discharge: 2017-09-12 | Disposition: A | Discharge status: Home / Self Care | Payer: Medicare Other | Source: Ambulatory Visit | Attending: Internal Medicine | Admitting: Internal Medicine

## 2017-09-12 VITALS — BP 144/74 | HR 66 | Temp 97.6°F | Ht 65.0 in | Wt 139.2 lb

## 2017-09-12 DIAGNOSIS — R0789 Other chest pain: Secondary | ICD-10-CM | POA: Diagnosis not present

## 2017-09-12 DIAGNOSIS — J988 Other specified respiratory disorders: Secondary | ICD-10-CM | POA: Diagnosis not present

## 2017-09-12 DIAGNOSIS — D500 Iron deficiency anemia secondary to blood loss (chronic): Secondary | ICD-10-CM | POA: Diagnosis not present

## 2017-09-12 DIAGNOSIS — R059 Cough, unspecified: Secondary | ICD-10-CM

## 2017-09-12 DIAGNOSIS — R001 Bradycardia, unspecified: Secondary | ICD-10-CM

## 2017-09-12 DIAGNOSIS — R05 Cough: Secondary | ICD-10-CM

## 2017-09-12 DIAGNOSIS — J4521 Mild intermittent asthma with (acute) exacerbation: Secondary | ICD-10-CM | POA: Diagnosis not present

## 2017-09-12 HISTORY — DX: Bradycardia, unspecified: R00.1

## 2017-09-12 HISTORY — DX: Mild intermittent asthma with (acute) exacerbation: J45.21

## 2017-09-12 LAB — POCT EXHALED NITRIC OXIDE: FENO LEVEL (PPB): 27

## 2017-09-12 LAB — CBC WITH DIFFERENTIAL/PLATELET
BASOS ABS: 0 10*3/uL (ref 0.0–0.1)
BASOS PCT: 0.6 % (ref 0.0–3.0)
Eosinophils Absolute: 0.4 10*3/uL (ref 0.0–0.7)
Eosinophils Relative: 7.3 % — ABNORMAL HIGH (ref 0.0–5.0)
HEMATOCRIT: 40.4 % (ref 39.0–52.0)
Hemoglobin: 14 g/dL (ref 13.0–17.0)
Lymphs Abs: 0.4 10*3/uL — ABNORMAL LOW (ref 0.7–4.0)
MCHC: 34.6 g/dL (ref 30.0–36.0)
MCV: 102.1 fl — ABNORMAL HIGH (ref 78.0–100.0)
MONO ABS: 0.6 10*3/uL (ref 0.1–1.0)
Monocytes Relative: 9.5 % (ref 3.0–12.0)
Neutro Abs: 4.5 10*3/uL (ref 1.4–7.7)
Neutrophils Relative %: 75.2 % (ref 43.0–77.0)
Platelets: 134 10*3/uL — ABNORMAL LOW (ref 150.0–400.0)
RBC: 3.96 Mil/uL — AB (ref 4.22–5.81)
RDW: 15.1 % (ref 11.5–15.5)
WBC: 6 10*3/uL (ref 4.0–10.5)

## 2017-09-12 LAB — THYROID PANEL WITH TSH
FREE THYROXINE INDEX: 2.2 (ref 1.4–3.8)
T3 UPTAKE: 33 % (ref 22–35)
T4 TOTAL: 6.8 ug/dL (ref 4.9–10.5)
TSH: 2.12 mIU/L (ref 0.40–4.50)

## 2017-09-12 LAB — IBC PANEL
IRON: 147 ug/dL (ref 42–165)
SATURATION RATIOS: 46.9 % (ref 20.0–50.0)
Transferrin: 224 mg/dL (ref 212.0–360.0)

## 2017-09-12 LAB — FERRITIN: FERRITIN: 612.1 ng/mL — AB (ref 22.0–322.0)

## 2017-09-12 MED ORDER — METHYLPREDNISOLONE 4 MG PO TBPK: ORAL_TABLET | ORAL | 0 refills | Status: DC

## 2017-09-12 MED ORDER — ALBUTEROL SULFATE HFA 108 (90 BASE) MCG/ACT IN AERS: 1.0000 | INHALATION_SPRAY | Freq: Four times a day (QID) | RESPIRATORY_TRACT | 3 refills | Status: AC | PRN

## 2017-09-12 MED ORDER — METHYLPREDNISOLONE ACETATE 80 MG/ML IJ SUSP
80.0000 mg | Freq: Once | INTRAMUSCULAR | Status: AC
  Administered 2017-09-12: 80 mg via INTRAMUSCULAR

## 2017-09-12 MED ORDER — CEFDINIR 300 MG PO CAPS: 300.0000 mg | ORAL_CAPSULE | Freq: Two times a day (BID) | ORAL | 0 refills | Status: AC

## 2017-09-12 NOTE — Progress Notes (Signed)
Subjective:  Patient ID: Bruce Haring., male    DOB: [birth year]  Age: 72 y.o. MRN: [id number]  CC: Cough   HPI Bruce Jordan. presents for a one month history of cough that is intermittently productive of thick yellow phlegm.  He is also had a few episodes of wheezing and chest tightness.  He has used his wife's albuterol inhaler and is gotten some relief from the tightness and wheezing with that.  He denies fever, chills, night sweats, DOE, or exertional chest pain.  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  Outpatient Medications Prior to Visit  Medication Sig Dispense Refill  . Ascorbic Acid (VITAMIN C) 100 MG tablet Take 100 mg by mouth daily.    Marland Kitchen aspirin EC 81 MG tablet Take 81 mg by mouth daily.    Marland Kitchen atorvastatin (LIPITOR) 40 MG tablet Take 1 tablet (40 mg total) by mouth daily. 30 tablet 1  . cholecalciferol (VITAMIN D) 400 units TABS tablet Take 400 Units by mouth daily.    . citalopram (CELEXA) 10 MG tablet Take 1 tablet (10 mg total) by mouth daily. 30 tablet 0  . clobetasol cream (TEMOVATE) 0.05 % Apply 1 application topically 2 (two) times daily. (Patient taking differently: Apply 1 application topically 2 (two) times daily as needed (rash). ) 60 g 3  . Co-Enzyme Q-10 100 MG CAPS Take 10 mg by mouth daily.    . diclofenac sodium (VOLTAREN) 1 % GEL Apply 2 g topically 4 (four) times daily as needed (pain).     . ferrous sulfate 325 (65 FE) MG tablet Take 325 mg by mouth daily with breakfast.    . lisinopril (PRINIVIL,ZESTRIL) 20 MG tablet Take 1 tablet (20 mg total) by mouth daily. 90 tablet 0  . meloxicam (MOBIC) 15 MG tablet Take 1 tablet (15 mg total) by mouth daily. 30 tablet 0  . metoprolol tartrate (LOPRESSOR) 25 MG tablet Take 0.5 tablets (12.5 mg total) by mouth 2 (two) times daily. 90 tablet 1  . sildenafil (VIAGRA) 100 MG tablet Take 1 tablet (100 mg total) by mouth daily as needed for erectile dysfunction. 10  tablet 11  . Cetirizine HCl (ZYRTEC ALLERGY) 10 MG CAPS Take 1 capsule (10 mg total) by mouth daily for 2 days. 2 capsule 0   No facility-administered medications prior to visit.     ROS Review of Systems  Constitutional: Positive for fatigue. Negative for chills, diaphoresis and fever.  HENT: Negative.  Negative for facial swelling, sinus pressure and sore throat.   Eyes: Negative for visual disturbance.  Respiratory: Positive for cough and wheezing. Negative for chest tightness, shortness of breath and stridor.   Cardiovascular: Negative for chest pain, palpitations and leg swelling.  Gastrointestinal: Negative.  Negative for abdominal pain, constipation, diarrhea, nausea and vomiting.  Endocrine: Positive for cold intolerance. Negative for heat intolerance.  Genitourinary: Negative.  Negative for difficulty urinating.  Musculoskeletal: Negative.  Negative for arthralgias and myalgias.  Skin: Negative.  Negative for color change and pallor.  Neurological: Negative.  Negative for dizziness, weakness and light-headedness.  Hematological: Negative for adenopathy. Does not bruise/bleed easily.  Psychiatric/Behavioral: Negative.     Objective:  BP (!) 144/74 (BP Location: Left Arm, Patient Position: Sitting, Cuff Size: Normal)   Pulse 66   Temp 97.6 F (36.4 C) (Oral)   Ht 5\' 5"  (1.651 m)   Wt 139 lb 4 oz (  63.2 kg)   SpO2 97%   BMI 23.17 kg/m   BP Readings from Last 3 Encounters:  09/12/17 (!) 144/74  05/13/17 (!) 146/86  12/26/16 130/68    Wt Readings from Last 3 Encounters:  09/12/17 139 lb 4 oz (63.2 kg)  05/13/17 138 lb (62.6 kg)  12/26/16 139 lb 4 oz (63.2 kg)    Physical Exam  Constitutional: He is oriented to person, place, and time.  Non-toxic appearance. He does not have a sickly appearance. He does not appear ill. No distress.  HENT:  Mouth/Throat: Oropharynx is clear and moist. No oropharyngeal exudate.  Eyes: Conjunctivae are normal. No scleral icterus.    Neck: Normal range of motion. Neck supple. No JVD present. No thyromegaly present.  Cardiovascular: Regular rhythm.  Occasional extrasystoles are present. Bradycardia present. Exam reveals no gallop.  No murmur heard. EKG ---  Marked sinus  Bradycardia  - occasional ectopic ventricular beat    Low voltage in limb leads.   -Nonspecific ST depression   +   Negative precordial T-waves  -Nondiagnostic.   ABNORMAL- no change compared to the previous EKG  Pulmonary/Chest: No accessory muscle usage or stridor. No tachypnea. No respiratory distress. He has no decreased breath sounds. He has no wheezes. He has rhonchi in the left lower field. He has no rales.  Abdominal: Soft. Bowel sounds are normal. He exhibits no mass. There is no hepatosplenomegaly. There is no tenderness.  Musculoskeletal: Normal range of motion. He exhibits no edema, tenderness or deformity.  Lymphadenopathy:    He has no cervical adenopathy.  Neurological: He is alert and oriented to person, place, and time.  Skin: Skin is warm and dry. No rash noted. He is not diaphoretic.  Vitals reviewed.   Lab Results  Component Value Date   WBC 6.0 09/12/2017   HGB 14.0 09/12/2017   HCT 40.4 09/12/2017   PLT 134.0 (L) 09/12/2017   GLUCOSE 106 (H) 12/26/2016   CHOL 144 05/26/2016   TRIG 98.0 05/26/2016   HDL 62.80 05/26/2016   LDLCALC 62 05/26/2016   ALT 13 05/26/2016   AST 18 05/26/2016   NA 140 12/26/2016   K 4.6 12/26/2016   CL 108 12/26/2016   CREATININE 1.35 12/26/2016   BUN 18 12/26/2016   CO2 26 12/26/2016   TSH 2.12 09/12/2017   PSA 2.40 05/26/2016   HGBA1C 5.7 05/26/2016    No results found.  Assessment & Plan:   [place] was seen today for cough.  Diagnoses and all orders for this visit:  Cough- His chest x-ray is negative for mass or infiltrate.  He has wheezing and chest tightness and a slightly elevated FeNO score.  Will treat him for an acute exacerbation of asthma with a course of systemic steroids  and continued use of the albuterol inhaler. -     POCT EXHALED NITRIC OXIDE -     DG Chest 2 View; Future  Chest tightness- His EKG is unchanged.  His chest tightness does not sound like it is angina however I do think is related to an acute exacerbation of asthma. -     EKG 12-Lead  Iron deficiency anemia due to chronic blood loss-his H&H and iron levels are normal now.  I do not think this is the cause for his cold sensation. 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  Labs are negative for secondary causes. -     Thyroid Panel With TSH; Future  Mild intermittent asthma with acute exacerbation -     Discontinue: methylPREDNISolone (MEDROL DOSEPAK) 4 MG TBPK tablet; TAKE AS DIRECTED -     albuterol (PROVENTIL HFA;VENTOLIN HFA) 108 (90 Base) MCG/ACT inhaler; Inhale 1-2 puffs into the lungs every 6 (six) hours as needed for wheezing or shortness of breath. -     methylPREDNISolone acetate (DEPO-MEDROL) injection 80 mg  RTI (respiratory tract infection) -     cefdinir (OMNICEF) 300 MG capsule; Take 1 capsule (300 mg total) by mouth 2 (two) times daily for 10 days.   I have discontinued Greig CastillaAndrew T. Golladay Jr.'s methylPREDNISolone. I am also having him start on cefdinir and albuterol. Additionally, I am having him maintain his aspirin EC, cholecalciferol, Co-Enzyme Q-10, diclofenac sodium, ferrous sulfate, vitamin C, sildenafil, clobetasol cream, metoprolol tartrate, Cetirizine HCl, atorvastatin, meloxicam, lisinopril, and citalopram. We administered methylPREDNISolone acetate.  Meds ordered this encounter  Medications  . DISCONTD: methylPREDNISolone (MEDROL DOSEPAK) 4 MG TBPK tablet    Sig: TAKE AS DIRECTED    Dispense:  21 tablet    Refill:  0  . cefdinir (OMNICEF) 300 MG capsule    Sig: Take 1 capsule (300 mg total) by mouth 2 (two)  times daily for 10 days.    Dispense:  20 capsule    Refill:  0  . albuterol (PROVENTIL HFA;VENTOLIN HFA) 108 (90 Base) MCG/ACT inhaler    Sig: Inhale 1-2 puffs into the lungs every 6 (six) hours as needed for wheezing or shortness of breath.    Dispense:  18 g    Refill:  3  . methylPREDNISolone acetate (DEPO-MEDROL) injection 80 mg     Follow-up: Return in about 3 weeks (around 10/03/2017).  Sanda Lingerhomas Jones, MD

## 2017-09-12 NOTE — Patient Instructions (Signed)
Cough, Adult  Coughing is a reflex that clears your throat and your airways. Coughing helps to heal and protect your lungs. It is normal to cough occasionally, but a cough that happens with other symptoms or lasts a long time may be a sign of a condition that needs treatment. A cough may last only 2-3 weeks (acute), or it may last longer than 8 weeks (chronic).  What are the causes?  Coughing is commonly caused by:   Breathing in substances that irritate your lungs.   A viral or bacterial respiratory infection.   Allergies.   Asthma.   Postnasal drip.   Smoking.   Acid backing up from the stomach into the esophagus (gastroesophageal reflux).   Certain medicines.   Chronic lung problems, including COPD (or rarely, lung cancer).   Other medical conditions such as heart failure.    Follow these instructions at home:  Pay attention to any changes in your symptoms. Take these actions to help with your discomfort:   Take medicines only as told by your health care provider.  ? If you were prescribed an antibiotic medicine, take it as told by your health care provider. Do not stop taking the antibiotic even if you start to feel better.  ? Talk with your health care provider before you take a cough suppressant medicine.   Drink enough fluid to keep your urine clear or pale yellow.   If the air is dry, use a cold steam vaporizer or humidifier in your bedroom or your home to help loosen secretions.   Avoid anything that causes you to cough at work or at home.   If your cough is worse at night, try sleeping in a semi-upright position.   Avoid cigarette smoke. If you smoke, quit smoking. If you need help quitting, ask your health care provider.   Avoid caffeine.   Avoid alcohol.   Rest as needed.    Contact a health care provider if:   You have new symptoms.   You cough up pus.   Your cough does not get better after 2-3 weeks, or your cough gets worse.   You cannot control your cough with suppressant  medicines and you are losing sleep.   You develop pain that is getting worse or pain that is not controlled with pain medicines.   You have a fever.   You have unexplained weight loss.   You have night sweats.  Get help right away if:   You cough up blood.   You have difficulty breathing.   Your heartbeat is very fast.  This information is not intended to replace advice given to you by your health care provider. Make sure you discuss any questions you have with your health care provider.  Document Released: 09/24/2010 Document Revised: 09/03/2015 Document Reviewed: 06/04/2014  Elsevier Interactive Patient Education  2018 [employer]

## 2017-09-13 ENCOUNTER — Encounter: Payer: Self-pay | Admitting: Internal Medicine

## 2017-09-13 MED ORDER — METOPROLOL TARTRATE 25 MG PO TABS: 12.5000 mg | ORAL_TABLET | Freq: Two times a day (BID) | ORAL | 1 refills | Status: DC

## 2017-09-19 ENCOUNTER — Other Ambulatory Visit: Payer: Self-pay | Admitting: Internal Medicine

## 2017-09-19 ENCOUNTER — Encounter: Payer: Self-pay | Admitting: Internal Medicine

## 2017-09-19 DIAGNOSIS — M19012 Primary osteoarthritis, left shoulder: Principal | ICD-10-CM

## 2017-09-19 DIAGNOSIS — M19011 Primary osteoarthritis, right shoulder: Secondary | ICD-10-CM

## 2017-09-19 MED ORDER — MELOXICAM 15 MG PO TABS: 15.0000 mg | ORAL_TABLET | Freq: Every day | ORAL | 1 refills | Status: DC

## 2017-10-04 ENCOUNTER — Other Ambulatory Visit (INDEPENDENT_AMBULATORY_CARE_PROVIDER_SITE_OTHER): Payer: Medicare Other

## 2017-10-04 ENCOUNTER — Ambulatory Visit (INDEPENDENT_AMBULATORY_CARE_PROVIDER_SITE_OTHER): Payer: Medicare Other | Admitting: Internal Medicine

## 2017-10-04 ENCOUNTER — Encounter: Payer: Self-pay | Admitting: Internal Medicine

## 2017-10-04 VITALS — BP 140/54 | HR 64 | Temp 98.2°F | Resp 16 | Ht 65.0 in | Wt 134.0 lb

## 2017-10-04 DIAGNOSIS — I2510 Atherosclerotic heart disease of native coronary artery without angina pectoris: Secondary | ICD-10-CM

## 2017-10-04 DIAGNOSIS — N400 Enlarged prostate without lower urinary tract symptoms: Secondary | ICD-10-CM

## 2017-10-04 DIAGNOSIS — E785 Hyperlipidemia, unspecified: Secondary | ICD-10-CM | POA: Diagnosis not present

## 2017-10-04 DIAGNOSIS — R7303 Prediabetes: Secondary | ICD-10-CM

## 2017-10-04 DIAGNOSIS — I10 Essential (primary) hypertension: Secondary | ICD-10-CM | POA: Diagnosis not present

## 2017-10-04 LAB — BASIC METABOLIC PANEL
BUN: 25 mg/dL — AB (ref 6–23)
CHLORIDE: 107 meq/L (ref 96–112)
CO2: 25 meq/L (ref 19–32)
Calcium: 9.1 mg/dL (ref 8.4–10.5)
Creatinine, Ser: 1.41 mg/dL (ref 0.40–1.50)
GFR: 52.21 mL/min — AB (ref >60.00)
GLUCOSE: 131 mg/dL — AB (ref 70–99)
POTASSIUM: 4.3 meq/L (ref 3.5–5.1)
SODIUM: 138 meq/L (ref 135–145)

## 2017-10-04 LAB — PSA: PSA: 2.87 ng/mL (ref 0.10–4.00)

## 2017-10-04 LAB — HEMOGLOBIN A1C: Hgb A1c MFr Bld: 5.7 % (ref 4.6–6.5)

## 2017-10-04 LAB — LDL CHOLESTEROL, DIRECT: LDL DIRECT: 77 mg/dL

## 2017-10-04 LAB — LIPID PANEL
Cholesterol: 162 mg/dL (ref 0–200)
HDL: 70.8 mg/dL (ref >39.00)
NONHDL: 90.71
Total CHOL/HDL Ratio: 2
Triglycerides: 240 mg/dL — ABNORMAL HIGH (ref 0.0–149.0)
VLDL: 48 mg/dL — AB (ref 0.0–40.0)

## 2017-10-04 NOTE — Progress Notes (Signed)
Subjective:  Patient ID: Bruce Wenke., male    DOB: 1946-03-17  Age: 72 y.o. MRN: [id number]  CC: Coronary Artery Disease; Hypertension; and Hyperlipidemia   HPI Bruce Jordan. presents for f/up - He complains of chronic, unchanged arthralgias but gets symptom relief with an anti-inflammatory.  All of his recent upper respiratory symptoms have resolved.  He denies any recent episodes of CP, DOE, palpitations, edema, or fatigue.  Outpatient Medications Prior to Visit  Medication Sig Dispense Refill  . albuterol (PROVENTIL HFA;VENTOLIN HFA) 108 (90 Base) MCG/ACT inhaler Inhale 1-2 puffs into the lungs every 6 (six) hours as needed for wheezing or shortness of breath. 18 g 3  . Ascorbic Acid (VITAMIN C) 100 MG tablet Take 100 mg by mouth daily.    Marland Kitchen aspirin EC 81 MG tablet Take 81 mg by mouth daily.    Marland Kitchen atorvastatin (LIPITOR) 40 MG tablet Take 1 tablet (40 mg total) by mouth daily. 30 tablet 1  . cholecalciferol (VITAMIN D) 400 units TABS tablet Take 400 Units by mouth daily.    . citalopram (CELEXA) 10 MG tablet Take 1 tablet (10 mg total) by mouth daily. 30 tablet 0  . clobetasol cream (TEMOVATE) 0.05 % Apply 1 application topically 2 (two) times daily. (Patient taking differently: Apply 1 application topically 2 (two) times daily as needed (rash). ) 60 g 3  . Co-Enzyme Q-10 100 MG CAPS Take 10 mg by mouth daily.    . diclofenac sodium (VOLTAREN) 1 % GEL Apply 2 g topically 4 (four) times daily as needed (pain).     . ferrous sulfate 325 (65 FE) MG tablet Take 325 mg by mouth daily with breakfast.    . lisinopril (PRINIVIL,ZESTRIL) 20 MG tablet Take 1 tablet (20 mg total) by mouth daily. 90 tablet 0  . meloxicam (MOBIC) 15 MG tablet Take 1 tablet (15 mg total) by mouth daily. 90 tablet 1  . metoprolol tartrate (LOPRESSOR) 25 MG tablet Take 0.5 tablets (12.5 mg total) by mouth 2 (two) times daily. 90 tablet 1  . sildenafil (VIAGRA) 100 MG tablet Take 1 tablet (100 mg total) by  mouth daily as needed for erectile dysfunction. 10 tablet 11  . Cetirizine HCl (ZYRTEC ALLERGY) 10 MG CAPS Take 1 capsule (10 mg total) by mouth daily for 2 days. 2 capsule 0   No facility-administered medications prior to visit.     ROS Review of Systems  Constitutional: Negative.  Negative for diaphoresis and fatigue.  HENT: Negative.  Negative for trouble swallowing.   Eyes: Negative for visual disturbance.  Respiratory: Negative for cough, chest tightness, shortness of breath and wheezing.   Cardiovascular: Negative for chest pain, palpitations and leg swelling.  Gastrointestinal: Negative for abdominal pain, constipation, diarrhea, nausea and vomiting.  Endocrine: Negative.   Genitourinary: Negative.  Negative for difficulty urinating.  Musculoskeletal: Positive for arthralgias. Negative for back pain and myalgias.  Skin: Negative.  Negative for color change, pallor and rash.  Allergic/Immunologic: Negative.   Neurological: Negative.  Negative for dizziness, weakness, light-headedness and numbness.  Hematological: Negative for adenopathy. Does not bruise/bleed easily.  Psychiatric/Behavioral: Negative.     Objective:  BP (!) 140/54 (BP Location: Left Arm, Patient Position: Sitting, Cuff Size: Normal)   Pulse 64   Temp 98.2 F (36.8 C) (Oral)   Resp 16   Ht 5\' 5"  (1.651 m)   Wt 134 lb (60.8 kg)   SpO2 97%   BMI 22.30 kg/m  BP Readings from Last 3 Encounters:  10/04/17 (!) 140/54  09/12/17 (!) 144/74  05/13/17 (!) 146/86    Wt Readings from Last 3 Encounters:  10/04/17 134 lb (60.8 kg)  09/12/17 139 lb 4 oz (63.2 kg)  05/13/17 138 lb (62.6 kg)    Physical Exam  Constitutional: He is oriented to person, place, and time. No distress.  HENT:  Mouth/Throat: Oropharynx is clear and moist. No oropharyngeal exudate.  Eyes: Conjunctivae are normal. No scleral icterus.  Neck: Normal range of motion. Neck supple. No JVD present. No thyromegaly present.  Cardiovascular:  Normal rate, regular rhythm and normal heart sounds. Exam reveals no gallop.  No murmur heard. Pulmonary/Chest: Effort normal and breath sounds normal. No respiratory distress. He has no wheezes. He has no rales.  Abdominal: Soft. Normal appearance and bowel sounds are normal. He exhibits no mass. There is no hepatosplenomegaly. There is no tenderness. No hernia.  Musculoskeletal: Normal range of motion. He exhibits no edema, tenderness or deformity.  Neurological: He is alert and oriented to person, place, and time.  Skin: Skin is warm and dry. No rash noted. He is not diaphoretic.  Vitals reviewed.   Lab Results  Component Value Date   WBC 6.0 09/12/2017   HGB 14.0 09/12/2017   HCT 40.4 09/12/2017   PLT 134.0 (L) 09/12/2017   GLUCOSE 131 (H) 10/04/2017   CHOL 162 10/04/2017   TRIG 240.0 (H) 10/04/2017   HDL 70.80 10/04/2017   LDLDIRECT 77.0 10/04/2017   LDLCALC 62 05/26/2016   ALT 13 05/26/2016   AST 18 05/26/2016   NA 138 10/04/2017   K 4.3 10/04/2017   CL 107 10/04/2017   CREATININE 1.41 10/04/2017   BUN 25 (H) 10/04/2017   CO2 25 10/04/2017   TSH 2.12 09/12/2017   PSA 2.87 10/04/2017   HGBA1C 5.7 10/04/2017    Dg Chest 2 View  Result Date: 09/12/2017 CLINICAL DATA:  Cough, congestion, tightness on chest x 5 wks, HTN, hx of cardiac cath. EXAM: CHEST - 2 VIEW COMPARISON:  None. FINDINGS: Heart size is upper normal. 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 Lungs are clear. No pleural effusion or pneumothorax seen. 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 No acute or suspicious osseous finding. IMPRESSION: No active cardiopulmonary disease. No evidence of pneumonia or pulmonary edema. Electronically Signed   By: Bary RichardStan  Maynard M.D.   On: 09/12/2017 10:16    Assessment & Plan:   Bruce Jordan was seen today for coronary artery disease, hypertension and hyperlipidemia.  Diagnoses and all orders for this  visit:  Prediabetes- His random glucose was 131 but his A1c is at 5.7.  Medical therapy is not indicated. -     Basic metabolic panel; Future -     Hemoglobin A1c; Future  Hyperlipidemia with target LDL less than 70- He has achieved his LDL goal and is doing well on the statin. -     Lipid panel; Future  Benign prostatic hyperplasia without lower urinary tract symptoms- His PSA is normal which is reassuring that he does not have prostate cancer.  There are no symptoms that need to be treated. -     PSA; Future  Coronary artery disease involving native coronary artery of native heart without angina pectoris- He has had no recent symptoms suspicious for angina.  Will continue aggressive risk factor modification. -     Lipid panel; Future  Essential hypertension- His blood pressure is adequately well  controlled. -     Basic metabolic panel; Future   I am having Bruce Castilla [name] maintain his aspirin EC, cholecalciferol, Co-Enzyme Q-10, diclofenac sodium, ferrous sulfate, vitamin C, sildenafil, clobetasol cream, Cetirizine HCl, atorvastatin, lisinopril, citalopram, albuterol, metoprolol tartrate, and meloxicam.  No orders of the defined types were placed in this encounter.    Follow-up: Return in about 6 months (around 04/05/2018).  Sanda Linger, MD

## 2017-10-04 NOTE — Patient Instructions (Signed)

## 2017-10-05 ENCOUNTER — Encounter: Payer: Self-pay | Admitting: Internal Medicine

## 2017-10-10 ENCOUNTER — Other Ambulatory Visit: Payer: Self-pay | Admitting: Internal Medicine

## 2017-10-10 MED ORDER — CITALOPRAM HYDROBROMIDE 10 MG PO TABS: 10.0000 mg | ORAL_TABLET | Freq: Every day | ORAL | 1 refills | Status: DC

## 2017-10-18 ENCOUNTER — Encounter: Payer: Self-pay | Admitting: Internal Medicine

## 2017-10-18 MED ORDER — ATORVASTATIN CALCIUM 40 MG PO TABS: 40.0000 mg | ORAL_TABLET | Freq: Every day | ORAL | 1 refills | Status: DC

## 2017-11-21 ENCOUNTER — Other Ambulatory Visit: Payer: Self-pay | Admitting: Internal Medicine

## 2017-11-22 MED ORDER — LISINOPRIL 20 MG PO TABS: 20.0000 mg | ORAL_TABLET | Freq: Every day | ORAL | 1 refills | Status: DC

## 2017-12-05 ENCOUNTER — Encounter: Payer: Self-pay | Admitting: Cardiology

## 2017-12-21 ENCOUNTER — Ambulatory Visit: Payer: Medicare Other | Admitting: Cardiology

## 2017-12-23 NOTE — Progress Notes (Signed)
Cardiology Office Note    Date:  12/26/2017   ID:  Bruce Krohn., DOB 03-Oct-1945, MRN [id number]  PCP:  Etta Grandchild, MD  Cardiologist:  Derriona Branscom [place], MD    History of Present Illness:  Bruce Ogata. is a 72 y.o. male seen for follow up CAD. He has a history of CAD s/p CABG in 2008 in [place] for failed PCI. This was a LIMA to the LAD. In 2013 he had PCI of a tortuous RCA. In April 2016 he had a low risk Cardiolite without evidence of ischemia. He has a history of HTN and dyslipidemia. 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  He moved to [place] to be closer to family. Lives at [facility] with his wife. 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 He works at [employer] for [employer]. Stays active.   He denies any chest pain, SOB, palpitations, dizziness. Really no new complaints today.     Past Medical History:  Diagnosis Date  . Chronic ischemic heart disease   . Coronary artery disease   . Hyperlipidemia   . Hypertension   . IFG (impaired fasting glucose)   . Osteoarthritis     Past Surgical History:  Procedure Laterality Date  . CARDIAC CATHETERIZATION      Current Medications: Outpatient Medications Prior to Visit  Medication Sig Dispense Refill  . albuterol (PROVENTIL HFA;VENTOLIN HFA) 108 (90 Base) MCG/ACT inhaler Inhale 1-2 puffs into the lungs every 6 (six) hours as needed for wheezing or shortness of breath. 18 g 3  . Ascorbic Acid (VITAMIN C) 100 MG tablet Take 100 mg by mouth daily.    Marland Kitchen aspirin EC 81 MG tablet Take 81 mg by mouth daily.    Marland Kitchen atorvastatin (LIPITOR) 40 MG tablet Take 1 tablet (40 mg total) by mouth daily. 90 tablet 1  . cholecalciferol (VITAMIN D) 400 units TABS tablet Take 400 Units by mouth daily.    . citalopram (CELEXA) 10 MG tablet Take 1 tablet (10 mg total) by mouth daily. 90 tablet 1  . clobetasol cream (TEMOVATE) 0.05 % Apply 1 application topically 2 (two) times daily. (Patient taking  differently: Apply 1 application topically 2 (two) times daily as needed (rash). ) 60 g 3  . Co-Enzyme Q-10 100 MG CAPS Take 10 mg by mouth daily.    . diclofenac sodium (VOLTAREN) 1 % GEL Apply 2 g topically 4 (four) times daily as needed (pain).     Marland Kitchen EPINEPHrine (EPIPEN IJ) Inject as directed as directed.    . ferrous sulfate 325 (65 FE) MG tablet Take 325 mg by mouth daily with breakfast.    . lisinopril (PRINIVIL,ZESTRIL) 20 MG tablet Take 1 tablet (20 mg total) by mouth daily. 90 tablet 1  . meloxicam (MOBIC) 15 MG tablet Take 1 tablet (15 mg total) by mouth daily. 90 tablet 1  . metoprolol tartrate (LOPRESSOR) 25 MG tablet Take 0.5 tablets (12.5 mg total) by mouth 2 (two) times daily. 90 tablet 1  . sildenafil (VIAGRA) 100 MG tablet Take 1 tablet (100 mg total) by mouth daily as needed for erectile dysfunction. 10 tablet 11  . Cetirizine HCl (ZYRTEC ALLERGY) 10 MG CAPS Take 1 capsule (10 mg total) by mouth daily for 2 days. 2 capsule 0   No facility-administered medications prior to visit.      Allergies:   Patient has no known allergies.   Social History  Socioeconomic History  . Marital status: Married    Spouse name: Not on file  . Number of children: Not on file  . Years of education: Not on file  . Highest education level: Not on file  Occupational History  . Not on file  Social Needs  . Financial resource strain: Not on file  . Food insecurity:    Worry: Not on file    Inability: Not on file  . Transportation needs:    Medical: Not on file    Non-medical: Not on file  Tobacco Use  . Smoking status: Former Games developermoker  . Smokeless tobacco: Never Used  Substance and Sexual Activity  . Alcohol use: No  . Drug use: No  . Sexual activity: Yes    Birth control/protection: None  Lifestyle  . Physical activity:    Days per week: Not on file    Minutes per session: Not on file  . Stress: Not on file  Relationships  . Social connections:    Talks on phone: Not on file      Gets together: Not on file    Attends religious service: Not on file    Active member of club or organization: Not on file    Attends meetings of clubs or organizations: Not on file    Relationship status: Not on file  Other Topics Concern  . Not on file  Social History Narrative  . Not on file     Family History:  The patient's family history includes Cancer in his father; Early death (age of onset: 1230) in his brother; Early death (age of onset: 4040) in his brother; Heart disease in his father and mother; Hypertension in his mother; Stroke in his brother.   ROS:   Please see the history of present illness.    ROS All other systems reviewed and are negative.   PHYSICAL EXAM:   VS:  BP (!) 146/72   Pulse (!) 58   Ht 5\' 5"  (1.651 m)   Wt 134 lb 6.4 oz (61 kg)   BMI 22.37 kg/m    GENERAL:  Well appearing WM in NAD HEENT:  PERRL, EOMI, sclera are clear. Oropharynx is clear. NECK:  No jugular venous distention, carotid upstroke brisk and symmetric, no bruits, no thyromegaly or adenopathy LUNGS:  Clear to auscultation bilaterally CHEST:  Unremarkable HEART:  RRR,  PMI not displaced or sustained,S1 and S2 within normal limits, no S3, no S4: no clicks, no rubs, no murmurs ABD:  Soft, nontender. BS +, no masses or bruits. No hepatomegaly, no splenomegaly EXT:  2 + pulses throughout, no edema, no cyanosis no clubbing SKIN:  Warm and dry.  No rashes NEURO:  Alert and oriented x 3. Cranial nerves II through XII intact. PSYCH:  Cognitively intact       Wt Readings from Last 3 Encounters:  12/26/17 134 lb 6.4 oz (61 kg)  10/04/17 134 lb (60.8 kg)  09/12/17 139 lb 4 oz (63.2 kg)      Studies/Labs Reviewed:   EKG:  EKG is not ordered today.     Recent Labs: 09/12/2017: Hemoglobin 14.0; Platelets 134.0; TSH 2.12 10/04/2017: BUN 25; Creatinine, Ser 1.41; Potassium 4.3; Sodium 138   Lipid Panel    Component Value Date/Time   CHOL 162 10/04/2017 1607   TRIG 240.0 (H)  10/04/2017 1607   HDL 70.80 10/04/2017 1607   CHOLHDL 2 10/04/2017 1607   VLDL 48.0 (H) 10/04/2017 1607   LDLCALC 62 05/26/2016  1610   LDLDIRECT 77.0 10/04/2017 1607    Additional studies/ records that were reviewed today include:  Labs dated 08/24/15: glucose 177, creatinine 1.47. Otherwise CMET normal. Platelets 118K, otherwise CBC is normal. A1c 5.9%. TSH normal. Cholesterol 170, triglycerides 119, LDL 85, HDL 61. LDL particle number [id number]. Dated 05/26/16: cholesterol 144, triglycerides 98, HDL 62, LDL 62. A1c 5.7%. Creatinine 1.27. Hgb, TSH, ALT normal. Dated 10/04/17: cholesterol 162, triglycerides 240, HDL 70, LDL 77. A1c 5.7%. Creatinine 1.41. ALT normal. CBC normal.   ASSESSMENT:   1. CAD s/p CABG in 2008. Last Myoview in 2016 looked good. He is asymptomatic. Continue medical therapy with ASA, statin, and lopressor. 2. HTN well controlled continue metoprolol, lisinopril 3. Hypercholesterolemia. On high dose statin. On last lab triglycerides higher and LDL up a little. States this lab was not fasting.  4. CKD stage 3.   Follow up in one year.    Medication Adjustments/Labs and Tests Ordered: Current medicines are reviewed at length with the patient today.  Concerns regarding medicines are outlined above.  Medication changes, Labs and Tests ordered today are listed in the Patient Instructions below. There are no Patient Instructions on file for this visit.   Signed, Maxton Noreen [place], MD  12/26/2017 9:47 AM    [facility] [address], [place], [place], [address] [phone]

## 2017-12-26 ENCOUNTER — Encounter: Payer: Self-pay | Admitting: Cardiology

## 2017-12-26 ENCOUNTER — Ambulatory Visit (INDEPENDENT_AMBULATORY_CARE_PROVIDER_SITE_OTHER): Payer: Medicare Other | Admitting: Cardiology

## 2017-12-26 VITALS — BP 146/72 | HR 58 | Ht 65.0 in | Wt 134.4 lb

## 2017-12-26 DIAGNOSIS — I25118 Atherosclerotic heart disease of native coronary artery with other forms of angina pectoris: Secondary | ICD-10-CM

## 2017-12-26 DIAGNOSIS — E7800 Pure hypercholesterolemia, unspecified: Secondary | ICD-10-CM | POA: Diagnosis not present

## 2017-12-26 DIAGNOSIS — I2510 Atherosclerotic heart disease of native coronary artery without angina pectoris: Secondary | ICD-10-CM

## 2017-12-26 DIAGNOSIS — I10 Essential (primary) hypertension: Secondary | ICD-10-CM | POA: Diagnosis not present

## 2017-12-26 DIAGNOSIS — N183 Chronic kidney disease, stage 3 unspecified: Secondary | ICD-10-CM

## 2017-12-26 NOTE — Patient Instructions (Signed)
Continue your current therapy  I will see you in one year   

## 2018-01-23 ENCOUNTER — Other Ambulatory Visit: Payer: Self-pay | Admitting: Internal Medicine

## 2018-01-23 DIAGNOSIS — N5201 Erectile dysfunction due to arterial insufficiency: Secondary | ICD-10-CM

## 2018-02-14 DIAGNOSIS — Z23 Encounter for immunization: Secondary | ICD-10-CM | POA: Diagnosis not present

## 2018-03-14 ENCOUNTER — Encounter: Payer: Self-pay | Admitting: Internal Medicine

## 2018-03-14 DIAGNOSIS — H9190 Unspecified hearing loss, unspecified ear: Secondary | ICD-10-CM

## 2018-03-14 MED ORDER — METOPROLOL TARTRATE 25 MG PO TABS: 12.5000 mg | ORAL_TABLET | Freq: Two times a day (BID) | ORAL | 0 refills | Status: DC

## 2018-03-23 ENCOUNTER — Encounter: Payer: Self-pay | Admitting: Internal Medicine

## 2018-03-25 ENCOUNTER — Other Ambulatory Visit: Payer: Self-pay | Admitting: Internal Medicine

## 2018-03-25 DIAGNOSIS — M19012 Primary osteoarthritis, left shoulder: Principal | ICD-10-CM

## 2018-03-25 DIAGNOSIS — M19011 Primary osteoarthritis, right shoulder: Secondary | ICD-10-CM

## 2018-03-25 MED ORDER — MELOXICAM 15 MG PO TABS: 15.0000 mg | ORAL_TABLET | Freq: Every day | ORAL | 0 refills | Status: DC

## 2018-04-05 ENCOUNTER — Other Ambulatory Visit: Payer: Self-pay | Admitting: Family

## 2018-04-05 ENCOUNTER — Encounter: Payer: Self-pay | Admitting: Internal Medicine

## 2018-04-05 MED ORDER — CITALOPRAM HYDROBROMIDE 10 MG PO TABS: 10.0000 mg | ORAL_TABLET | Freq: Every day | ORAL | 0 refills | Status: DC

## 2018-04-12 ENCOUNTER — Ambulatory Visit (INDEPENDENT_AMBULATORY_CARE_PROVIDER_SITE_OTHER): Payer: Medicare Other | Admitting: Internal Medicine

## 2018-04-12 ENCOUNTER — Encounter: Payer: Self-pay | Admitting: Internal Medicine

## 2018-04-12 ENCOUNTER — Other Ambulatory Visit (INDEPENDENT_AMBULATORY_CARE_PROVIDER_SITE_OTHER): Payer: Medicare Other

## 2018-04-12 VITALS — BP 138/86 | HR 80 | Temp 98.0°F | Resp 16 | Ht 65.0 in | Wt 137.0 lb

## 2018-04-12 DIAGNOSIS — D500 Iron deficiency anemia secondary to blood loss (chronic): Secondary | ICD-10-CM

## 2018-04-12 DIAGNOSIS — Z1159 Encounter for screening for other viral diseases: Secondary | ICD-10-CM

## 2018-04-12 DIAGNOSIS — I10 Essential (primary) hypertension: Secondary | ICD-10-CM | POA: Diagnosis not present

## 2018-04-12 DIAGNOSIS — R413 Other amnesia: Secondary | ICD-10-CM | POA: Insufficient documentation

## 2018-04-12 DIAGNOSIS — R7303 Prediabetes: Secondary | ICD-10-CM

## 2018-04-12 LAB — CBC WITH DIFFERENTIAL/PLATELET
BASOS ABS: 0 10*3/uL (ref 0.0–0.1)
Basophils Relative: 0.5 % (ref 0.0–3.0)
Eosinophils Absolute: 0.2 10*3/uL (ref 0.0–0.7)
Eosinophils Relative: 3.4 % (ref 0.0–5.0)
HCT: 40.1 % (ref 39.0–52.0)
Hemoglobin: 13.9 g/dL (ref 13.0–17.0)
Lymphocytes Relative: 7.8 % — ABNORMAL LOW (ref 12.0–46.0)
Lymphs Abs: 0.5 10*3/uL — ABNORMAL LOW (ref 0.7–4.0)
MCHC: 34.6 g/dL (ref 30.0–36.0)
MCV: 105.6 fl — ABNORMAL HIGH (ref 78.0–100.0)
Monocytes Absolute: 0.6 10*3/uL (ref 0.1–1.0)
Monocytes Relative: 9.6 % (ref 3.0–12.0)
Neutro Abs: 5.3 10*3/uL (ref 1.4–7.7)
Neutrophils Relative %: 78.7 % — ABNORMAL HIGH (ref 43.0–77.0)
Platelets: 113 10*3/uL — ABNORMAL LOW (ref 150.0–400.0)
RBC: 3.8 Mil/uL — ABNORMAL LOW (ref 4.22–5.81)
RDW: 14.8 % (ref 11.5–15.5)
WBC: 6.7 10*3/uL (ref 4.0–10.5)

## 2018-04-12 LAB — BASIC METABOLIC PANEL
BUN: 19 mg/dL (ref 6–23)
CO2: 26 mEq/L (ref 19–32)
Calcium: 9.1 mg/dL (ref 8.4–10.5)
Chloride: 107 mEq/L (ref 96–112)
Creatinine, Ser: 1.48 mg/dL (ref 0.40–1.50)
GFR: 49.3 mL/min — AB (ref >60.00)
Glucose, Bld: 91 mg/dL (ref 70–99)
Potassium: 4 mEq/L (ref 3.5–5.1)
Sodium: 142 mEq/L (ref 135–145)

## 2018-04-12 NOTE — Patient Instructions (Signed)

## 2018-04-12 NOTE — Progress Notes (Signed)
Subjective:  Patient ID: Bruce Mondrew T Mcfetridge Jr., male    DOB: 10/01/1945  Age: 73 y.o. MRN: [id number]  CC: Hypertension   HPI Bruce Mondrew T Jaeger Jr. presents for a BP check - He and his wife complain that his short-term memory has been declining over the last few years.  He tells me his blood pressure has been well controlled and he denies any recent episodes of headache/blurred vision/CP/DOE/palpitations/edema/fatigue.  Outpatient Medications Prior to Visit  Medication Sig Dispense Refill  . albuterol (PROVENTIL HFA;VENTOLIN HFA) 108 (90 Base) MCG/ACT inhaler Inhale 1-2 puffs into the lungs every 6 (six) hours as needed for wheezing or shortness of breath. 18 g 3  . Ascorbic Acid (VITAMIN C) 100 MG tablet Take 100 mg by mouth daily.    Marland Kitchen. aspirin EC 81 MG tablet Take 81 mg by mouth daily.    Marland Kitchen. atorvastatin (LIPITOR) 40 MG tablet Take 1 tablet (40 mg total) by mouth daily. 90 tablet 1  . cholecalciferol (VITAMIN D) 400 units TABS tablet Take 400 Units by mouth daily.    . citalopram (CELEXA) 10 MG tablet Take 1 tablet (10 mg total) by mouth daily. 90 tablet 0  . clobetasol cream (TEMOVATE) 0.05 % Apply 1 application topically 2 (two) times daily. (Patient taking differently: Apply 1 application topically 2 (two) times daily as needed (rash). ) 60 g 3  . Co-Enzyme Q-10 100 MG CAPS Take 10 mg by mouth daily.    . diclofenac sodium (VOLTAREN) 1 % GEL Apply 2 g topically 4 (four) times daily as needed (pain).     Marland Kitchen. EPINEPHrine (EPIPEN IJ) Inject as directed as directed.    . ferrous sulfate 325 (65 FE) MG tablet Take 325 mg by mouth daily with breakfast.    . lisinopril (PRINIVIL,ZESTRIL) 20 MG tablet Take 1 tablet (20 mg total) by mouth daily. 90 tablet 1  . meloxicam (MOBIC) 15 MG tablet Take 1 tablet (15 mg total) by mouth daily. 90 tablet 0  . metoprolol tartrate (LOPRESSOR) 25 MG tablet Take 0.5 tablets (12.5 mg total) by mouth 2 (two) times daily. 30 tablet 0  . sildenafil (VIAGRA) 100 MG  tablet TAKE 1 TABLET BY MOUTH ONCE DAILY AS NEEDED FOR ERECTILE DYSFUNCTION 10 tablet 5  . Cetirizine HCl (ZYRTEC ALLERGY) 10 MG CAPS Take 1 capsule (10 mg total) by mouth daily for 2 days. 2 capsule 0   No facility-administered medications prior to visit.     ROS Review of Systems  Constitutional: Negative.  Negative for diaphoresis, fatigue and unexpected weight change.  HENT: Negative.   Eyes: Negative for visual disturbance.  Respiratory: Negative for cough, chest tightness, shortness of breath and wheezing.   Gastrointestinal: Negative for abdominal pain, blood in stool, diarrhea and vomiting.  Endocrine: Negative.   Genitourinary: Negative.  Negative for difficulty urinating.  Musculoskeletal: Positive for arthralgias. Negative for neck pain.  Skin: Negative.  Negative for color change.  Neurological: Negative.  Negative for dizziness, speech difficulty, weakness, light-headedness, numbness and headaches.  Hematological: Negative for adenopathy. Does not bruise/bleed easily.  Psychiatric/Behavioral: Positive for decreased concentration. Negative for confusion, dysphoric mood, hallucinations, sleep disturbance and suicidal ideas. The patient is not nervous/anxious.     Objective:  BP 138/86 (BP Location: Left Arm, Patient Position: Sitting, Cuff Size: Normal)   Pulse 80   Temp 98 F (36.7 C) (Oral)   Resp 16   Ht 5\' 5"  (1.651 m)   Wt 137 lb (62.1 kg)  SpO2 97%   BMI 22.80 kg/m   BP Readings from Last 3 Encounters:  04/12/18 138/86  12/26/17 (!) 146/72  10/04/17 (!) 140/54    Wt Readings from Last 3 Encounters:  04/12/18 137 lb (62.1 kg)  12/26/17 134 lb 6.4 oz (61 kg)  10/04/17 134 lb (60.8 kg)    Physical Exam Vitals signs reviewed.  Constitutional:      Appearance: Normal appearance. He is normal weight.  HENT:     Nose: Nose normal. No congestion.     Mouth/Throat:     Mouth: Mucous membranes are moist.     Pharynx: No posterior oropharyngeal erythema.    Eyes:     Conjunctiva/sclera: Conjunctivae normal.     Pupils: Pupils are equal, round, and reactive to light.  Neck:     Musculoskeletal: Normal range of motion and neck supple. No muscular tenderness.  Cardiovascular:     Rate and Rhythm: Normal rate and regular rhythm.     Heart sounds: No murmur. No gallop.   Pulmonary:     Effort: Pulmonary effort is normal.     Breath sounds: Normal breath sounds. No wheezing, rhonchi or rales.  Abdominal:     General: Bowel sounds are normal.     Palpations: There is no mass.     Tenderness: There is no abdominal tenderness. There is no guarding.     Hernia: No hernia is present.  Musculoskeletal: Normal range of motion.        General: No swelling or tenderness.     Right lower leg: No edema.     Left lower leg: No edema.  Lymphadenopathy:     Cervical: No cervical adenopathy.  Skin:    General: Skin is warm and dry.     Coloration: Skin is not pale.     Findings: No rash.  Neurological:     General: No focal deficit present.     Mental Status: He is alert and oriented to person, place, and time. Mental status is at baseline.  Psychiatric:        Mood and Affect: Mood normal.        Behavior: Behavior normal.        Thought Content: Thought content normal.        Judgment: Judgment normal.     Lab Results  Component Value Date   WBC 6.7 04/12/2018   HGB 13.9 04/12/2018   HCT 40.1 04/12/2018   PLT 113.0 (L) 04/12/2018   GLUCOSE 91 04/12/2018   CHOL 162 10/04/2017   TRIG 240.0 (H) 10/04/2017   HDL 70.80 10/04/2017   LDLDIRECT 77.0 10/04/2017   LDLCALC 62 05/26/2016   ALT 13 05/26/2016   AST 18 05/26/2016   NA 142 04/12/2018   K 4.0 04/12/2018   CL 107 04/12/2018   CREATININE 1.48 04/12/2018   BUN 19 04/12/2018   CO2 26 04/12/2018   TSH 2.12 09/12/2017   PSA 2.87 10/04/2017   HGBA1C 5.7 10/04/2017    Dg Chest 2 View  Result Date: 09/12/2017 CLINICAL DATA:  Cough, congestion, tightness on chest x 5 wks, HTN, hx of  cardiac cath. EXAM: CHEST - 2 VIEW COMPARISON:  None. FINDINGS: Heart size is upper normal. 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 Lungs are clear. No pleural effusion or pneumothorax seen. 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 No acute or suspicious osseous finding. IMPRESSION: No active cardiopulmonary disease. No evidence of pneumonia or  pulmonary edema. Electronically Signed   By: Bary Richard M.D.   On: 09/12/2017 10:16    Assessment & Plan:   Bruce Jordan was seen today for hypertension.  Diagnoses and all orders for this visit:  Essential hypertension- His blood pressure is adequately well controlled.  Electrolytes and renal function are normal. -     Basic metabolic panel; Future  Prediabetes- His A1c is at 5.7%.  Medical therapy is not indicated. -     Basic metabolic panel; Future  Iron deficiency anemia due to chronic blood loss- His H&H are normal now. -     CBC with Differential/Platelet; Future  Memory loss or impairment- I have asked him to see neurology to consider diagnostic and treatment options. -     Ambulatory referral to Neurology  Need for hepatitis C screening test -     Hepatitis C antibody; Future   I am having Bruce Castilla [name] "Bruce Jordan" maintain his aspirin EC, cholecalciferol, Co-Enzyme Q-10, diclofenac sodium, ferrous sulfate, vitamin C, clobetasol cream, Cetirizine HCl, albuterol, atorvastatin, lisinopril, EPINEPHrine (EPIPEN IJ), sildenafil, metoprolol tartrate, meloxicam, and citalopram.  No orders of the defined types were placed in this encounter.    Follow-up: Return in about 6 months (around 10/11/2018).  Sanda Linger, MD

## 2018-04-13 LAB — HEPATITIS C ANTIBODY
Hepatitis C Ab: NONREACTIVE
SIGNAL TO CUT-OFF: 0.01 (ref <1.00)

## 2018-04-14 ENCOUNTER — Encounter: Payer: Self-pay | Admitting: Internal Medicine

## 2018-04-15 ENCOUNTER — Encounter: Payer: Self-pay | Admitting: Internal Medicine

## 2018-04-16 MED ORDER — METOPROLOL TARTRATE 25 MG PO TABS: 12.5000 mg | ORAL_TABLET | Freq: Two times a day (BID) | ORAL | 1 refills | Status: DC

## 2018-04-27 ENCOUNTER — Encounter: Payer: Self-pay | Admitting: Internal Medicine

## 2018-04-27 MED ORDER — ATORVASTATIN CALCIUM 40 MG PO TABS: 40.0000 mg | ORAL_TABLET | Freq: Every day | ORAL | 1 refills | Status: DC

## 2018-05-22 ENCOUNTER — Encounter: Payer: Self-pay | Admitting: Internal Medicine

## 2018-05-23 MED ORDER — LISINOPRIL 20 MG PO TABS: 20.0000 mg | ORAL_TABLET | Freq: Every day | ORAL | 1 refills | Status: DC

## 2018-05-30 ENCOUNTER — Ambulatory Visit: Payer: Medicare Other | Attending: Audiology | Admitting: Audiology

## 2018-05-30 DIAGNOSIS — Z7189 Other specified counseling: Secondary | ICD-10-CM | POA: Insufficient documentation

## 2018-05-30 NOTE — Progress Notes (Signed)
Patient ID: Bruce Janus., male   DOB: 1945-09-18, 73 y.o.   MRN: [id number]   Bruce Jordan. and his wife arrived for the audiology appointment.  They wanted a second opinion about hearing aids. Hearing aids were purchased at [facility] in October 2019, but they forgot to bring the hearing aids and [facility] has no equipment for hearing aid fitting.  Bruce Mo. states that he has significant "tinnitus" and served in [place].  Information was provided for hearing aids and Audiology through the [facility] for both treatment of the tinnitus as well as hearing aid equipment to help with hearing in background noise.  Bruce Mo. and his wife preferred that today's audiology appointment be cancelled.  They plan to follow-up with the [facility].  Bruce Jordan, Au.D., CCC-A Doctor of Audiology 05/30/2018

## 2018-06-19 ENCOUNTER — Other Ambulatory Visit: Payer: Self-pay | Admitting: Internal Medicine

## 2018-06-19 DIAGNOSIS — M19012 Primary osteoarthritis, left shoulder: Principal | ICD-10-CM

## 2018-06-19 DIAGNOSIS — M19011 Primary osteoarthritis, right shoulder: Secondary | ICD-10-CM

## 2018-06-19 MED ORDER — MELOXICAM 15 MG PO TABS: 15.0000 mg | ORAL_TABLET | Freq: Every day | ORAL | 0 refills | Status: DC

## 2018-06-21 ENCOUNTER — Other Ambulatory Visit: Payer: Self-pay

## 2018-06-21 ENCOUNTER — Encounter: Payer: Self-pay | Admitting: Neurology

## 2018-06-21 ENCOUNTER — Ambulatory Visit (INDEPENDENT_AMBULATORY_CARE_PROVIDER_SITE_OTHER): Payer: Medicare Other | Admitting: Neurology

## 2018-06-21 ENCOUNTER — Encounter

## 2018-06-21 VITALS — BP 155/85 | HR 62 | Ht 65.0 in | Wt 133.0 lb

## 2018-06-21 DIAGNOSIS — R413 Other amnesia: Secondary | ICD-10-CM

## 2018-06-21 DIAGNOSIS — G3184 Mild cognitive impairment, so stated: Secondary | ICD-10-CM | POA: Diagnosis not present

## 2018-06-21 DIAGNOSIS — G309 Alzheimer's disease, unspecified: Secondary | ICD-10-CM

## 2018-06-21 NOTE — Patient Instructions (Signed)
MRI brain Labwork Formal Memory Testing in [place]   Dementia Dementia is the loss of two or more brain functions, such as:  Memory.  Decision making.  Behavior.  Speaking.  Thinking.  Problem solving. There are many types of dementia. The most common type is called progressive dementia. Progressive dementia gets worse with time and it is irreversible. An example of this type of dementia is Alzheimer disease. What are the causes? This condition may be caused by:  Nerve cell damage in the brain.  Genetic mutations.  Certain medicines.  Multiple small strokes.  An infection, such as chronic meningitis.  A metabolic problem, such as vitamin B12 deficiency or thyroid disease.  Pressure on the brain, such as from a tumor or blood clot. What are the signs or symptoms? Symptoms of this condition include:  Sudden changes in mood.  Depression.  Problems with balance.  Changes in personality.  Poor short-term memory.  Agitation.  Delusions.  Hallucinations.  Having a hard time: ? Speaking thoughts. ? Finding words. ? Solving problems. ? Doing familiar tasks. ? Understanding familiar ideas. How is this diagnosed? This condition is diagnosed with an assessment by your health care provider. During this assessment, your health care provider will talk with you and your family, friends, or caregivers about your symptoms. A thorough medical history will be taken, and you will have a physical exam and tests. Tests may include:  Lab tests, such as blood or urine tests.  Imaging tests, such as a CT scan, PET scan, or MRI.  A lumbar puncture. This test involves removing and testing a small amount of the fluid that surrounds the brain and spinal cord.  An electroencephalogram (EEG). In this test, small metal discs are used to measure electrical activity in the brain.  Memory tests, cognitive tests, and neuropsychological tests. These tests evaluate brain function.  How is this treated? Treatment depends on the cause of the dementia. It may involve taking medicines that may help:  To control the dementia.  To slow down the disease.  To manage symptoms. In some cases, treating the cause of the dementia can improve symptoms, reverse symptoms, or slow down how quickly the dementia gets worse. Your health care provider can help direct you to support groups, organizations, and other health care providers who can help with decisions about your care. Follow these instructions at home: Medicine  Take over-the-counter and prescription medicines only as told by your health care provider.  Avoid taking medicines that can affect thinking, such as pain or sleeping medicines. Lifestyle   Make healthy lifestyle choices: ? Be physically active as told by your health care provider. ? Do not use any tobacco products, such as cigarettes, chewing tobacco, and e-cigarettes. If you need help quitting, ask your health care provider. ? Eat a healthy diet. ? Practice stress-management techniques when you get stressed. ? Stay social.  Drink enough fluid to keep your urine clear or pale yellow.  Make sure to get quality sleep. These tips can help you to get a good night's rest: ? Avoid napping during the day. ? Keep your sleeping area dark and cool. ? Avoid exercising during the few hours before you go to bed. ? Avoid caffeine products in the evening. General instructions  Work with your health care provider to determine what you need help with and what your safety needs are.  If you were given a bracelet that tracks your location, make sure to wear it.  Keep all follow-up  visits as told by your health care provider. This is important. Contact a health care provider if:  You have any new symptoms.  You have problems with choking or swallowing.  You have any symptoms of a different illness. Get help right away if:  You develop a fever.  You have new or  worsening confusion.  You have new or worsening sleepiness.  You have a hard time staying awake.  You or your family members become concerned for your safety. This information is not intended to replace advice given to you by your health care provider. Make sure you discuss any questions you have with your health care provider. Document Released: 09/21/2000 Document Revised: 08/06/2015 Document Reviewed: 12/24/2014 Elsevier Interactive Patient Education  [employer].

## 2018-06-21 NOTE — Progress Notes (Signed)
[id number]    Provider:  Dr Lucia Gaskins Requesting Provider: Etta Grandchild, MD Primary Care Provider:  Etta Grandchild, MD  CC:  Memory loss  HPI:  Dearrius Jordan. is a 73 y.o. male here as requested by Etta Grandchild, MD for memory loss. PMHx osteoarthritis, impaired fasting glucose, hypertension, asthma, bradycardia, depression, hyperlipidemia, coronary artery disease, chronic ischemic heart disease.  He is here with his wife who provides much information.He doesnt remember things he used to remember. Started 3 years and slowly progressive. He was working in [place] and when he came home he had a difficult time using the remote. It is more short-term memory. If someone tells him something he forgets, such as conversations, forgets appointments, he remembers birthdays, wife has always taken care of the bills for 33 years, he is still driving , no accidents or getting lost, he repeats questions and stories in the same day such as what time they are closing will ask 4-5 times in one day. His coworker has noticed. He doesnt know his family history and unclear if there is a history of dementia, he has hearing impairment. More short-term memory loss. He has an older sister without any memory loss. They have 1-2 glasses of alcohol a day. Still having difficulty with the remote but it is new, he still works with tools, he cooks, he makes lunch and brings it to his wife, he will make grilled cheese, he is more beligerant than he used to be, he works at [employer] and he will be more irritable and confrontational but not inappropriate behavior, no delusions or hallucinations. He feels hearing loss may be a factor as well. He is on an antidepressant and 3 years ago he had weight loss and wanted to sleep all the time and he feels better he does not feel sad most of the time. Socially nothing has changed. He takes his own medications but on a holiday it confused him when his edication was not  in the right spots and wife had to help.No significant history of smoking. He feels refreshed in the morning, no snoring. wife provides much information. No other focal neurologic deficits, associated symptoms, inciting events or modifiable factors.  Reviewed notes, labs and imaging from outside physicians, which showed:  Reviewed Dr. Yetta Barre notes.  Patient and wife complaining about short-term memory and has been declining over the last few years.  Denied any other associated events.  Review of Systems: Patient complains of symptoms per HPI as well as the following symptoms: Memory loss, decreased energy, anemia, easy bruising, feeling cold, joint pain, hearing loss. Pertinent negatives and positives per HPI. All others negative.   Social History   Socioeconomic History   Marital status: Married    Spouse name: Not on file   Number of children: Not on file   Years of education: Not on file   Highest education level: Not on file  Occupational History   Not on file  Social Needs   Financial resource strain: Not on file   Food insecurity:    Worry: Not on file    Inability: Not on file   Transportation needs:    Medical: Not on file    Non-medical: Not on file  Tobacco Use   Smoking status: Former Smoker   Smokeless tobacco: Never Used  Substance and Sexual Activity   Alcohol use: No   Drug use: No   Sexual activity: Yes    Birth control/protection: None  Lifestyle   Physical activity:    Days per week: Not on file    Minutes per session: Not on file   Stress: Not on file  Relationships   Social connections:    Talks on phone: Not on file    Gets together: Not on file    Attends religious service: Not on file    Active member of club or organization: Not on file    Attends meetings of clubs or organizations: Not on file    Relationship status: Not on file   Intimate partner violence:    Fear of current or ex partner: Not on file    Emotionally abused:  Not on file    Physically abused: Not on file    Forced sexual activity: Not on file  Other Topics Concern   Not on file  Social History Narrative   Not on file    Family History  Problem Relation Age of Onset   Hypertension Mother    Heart disease Mother    Heart disease Father    Cancer Father    Early death Brother [id number]       MVA   Stroke Brother    Early death Brother [id number]       Epilepsy    Past Medical History:  Diagnosis Date   Chronic ischemic heart disease    Coronary artery disease    Hyperlipidemia    Hypertension    IFG (impaired fasting glucose)    Osteoarthritis     Patient Active Problem List   Diagnosis Date Noted   Memory loss or impairment 04/12/2018   Need for hepatitis C screening test 04/12/2018   Bradycardia, severe sinus 09/12/2017   Mild intermittent asthma with acute exacerbation 09/12/2017   Erectile dysfunction due to arterial insufficiency 08/09/2016   Coronary artery disease involving native coronary artery of native heart without angina pectoris 05/25/2016   Hyperlipidemia with target LDL less than 70 05/25/2016   Essential hypertension 05/25/2016   Prediabetes 05/25/2016   Iron deficiency anemia due to chronic blood loss 05/25/2016   Benign prostatic hyperplasia without lower urinary tract symptoms 05/25/2016   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   Eczema 05/25/2016   Primary osteoarthritis of both shoulders 05/25/2016    Past Surgical History:  Procedure Laterality Date   CARDIAC CATHETERIZATION      Current Outpatient Medications  Medication Sig Dispense Refill   albuterol (PROVENTIL HFA;VENTOLIN HFA) 108 (90 Base) MCG/ACT inhaler Inhale 1-2 puffs into the lungs every 6 (six) hours as needed for wheezing or shortness of breath. 18 g 3   Ascorbic Acid (VITAMIN C) 100 MG tablet Take 100 mg by mouth daily.     aspirin EC 81 MG tablet Take 81 mg by mouth daily.     atorvastatin (LIPITOR) 40 MG  tablet Take 1 tablet (40 mg total) by mouth daily. 90 tablet 1   cholecalciferol (VITAMIN D) 400 units TABS tablet Take 400 Units by mouth daily.     citalopram (CELEXA) 10 MG tablet Take 1 tablet (10 mg total) by mouth daily. 90 tablet 0   clobetasol cream (TEMOVATE) 0.05 % Apply 1 application topically 2 (two) times daily. (Patient taking differently: Apply 1 application topically 2 (two) times daily as needed (rash). ) 60 g 3   Co-Enzyme Q-10 100 MG CAPS Take 10 mg by mouth daily.     diclofenac sodium (VOLTAREN) 1 % GEL Apply 2 g topically 4 (four) times daily as needed (  pain).      EPINEPHrine (EPIPEN IJ) Inject as directed as directed.     ferrous sulfate 325 (65 FE) MG tablet Take 325 mg by mouth daily with breakfast.     lisinopril (PRINIVIL,ZESTRIL) 20 MG tablet Take 1 tablet (20 mg total) by mouth daily. 90 tablet 1   meloxicam (MOBIC) 15 MG tablet Take 1 tablet (15 mg total) by mouth daily. 90 tablet 0   metoprolol tartrate (LOPRESSOR) 25 MG tablet Take 0.5 tablets (12.5 mg total) by mouth 2 (two) times daily. 90 tablet 1   sildenafil (VIAGRA) 100 MG tablet TAKE 1 TABLET BY MOUTH ONCE DAILY AS NEEDED FOR ERECTILE DYSFUNCTION 10 tablet 5   Cetirizine HCl (ZYRTEC PO) Take 10 mg by mouth daily.     Current Facility-Administered Medications  Medication Dose Route Frequency Provider Last Rate Last Dose   cyanocobalamin ((VITAMIN B-12)) injection 1,000 mcg  1,000 mcg Intramuscular Q7 days Etta Grandchild, MD        Allergies as of 06/21/2018   (No Known Allergies)    Vitals: BP (!) 155/85    Pulse 62    Ht  (1.651 m)    Wt 133 lb (60.3 kg)    BMI 22.13 kg/m  Last Weight:  Wt Readings from Last 1 Encounters:  06/21/18 133 lb (60.3 kg)   Last Height:   Ht Readings from Last 1 Encounters:  06/21/18  (1.651 m)     Physical exam: Exam: Gen: NAD, conversant, well nourised, well groomed                     CV: RRR, no MRG. No Carotid Bruits. No peripheral  edema, warm, nontender Eyes: Conjunctivae clear without exudates or hemorrhage  Neuro: Detailed Neurologic Exam  Speech:    Speech is normal; fluent and spontaneous with normal comprehension.  Cognition:  MMSE - Mini Mental State Exam 06/21/2018  Orientation to time 2  Orientation to Place 3  Registration 3  Attention/ Calculation 1  Recall 0  Language- name 2 objects 2  Language- repeat 1  Language- follow 3 step command 3  Language- read & follow direction 1  Write a sentence 1  Copy design 1  Total score 18       The patient is oriented to person, place, and time;     recent and remote memory impaired;     language fluent;     Impaired attention, concentration,  fund of knowledge Cranial Nerves:    The pupils are equal, round, and reactive to light. The fundi are nflat. Visual fields are full to finger confrontation. Extraocular movements are intact. Trigeminal sensation is intact and the muscles of mastication are normal. The face is symmetric. The palate elevates in the midline. Hearing impaired. Voice is normal. Shoulder shrug is normal. The tongue has normal motion without fasciculations.   Coordination:    Normal finger to nose and heel to shin.   Gait:    Heel-toe and tandem gait are normal.   Motor Observation:    No asymmetry, no atrophy, and no involuntary movements noted. Tone:    Normal muscle tone.    Posture:    Posture is normal. normal erect    Strength:    Strength is V/V in the upper and lower limbs.      Sensation: intact to LT     Reflex Exam:  DTR's:    Deep tendon reflexes in the upper and  lower extremities are brisk bilaterally.   Toes:    The toes are downgoing bilaterally.   Clonus:    Clonus is absent.    Assessment/Plan:  Bruce Jordan. is a 73 y.o. male here as requested by Etta Grandchild, MD for memory loss. PMHx osteoarthritis, impaired fasting glucose, hypertension, asthma, bradycardia, depression, hyperlipidemia,  coronary artery disease, chronic ischemic heart disease.  Patient and wife report approximately 3 years of declining memory, more short-term also a past medical history of depression and hearing loss which do not appear to be impacting him at this current moment.  Am concerned that this is dementia of Alzheimer's type however he needs a full work-up including MRI of the brain to rule out other causes of dementia as well as lab tests and formal neurocognitive testing (they would like to go to [place] to get this completed more quickly). Neuro exam non focal.  MMSE 18 out of 30.  Addendum: B12 133, B12 deficiency can cause neurocognitive disorders including dementia we will start patient on B12 injections.    Orders Placed This Encounter  Procedures   MR BRAIN WO CONTRAST   RPR   TSH   Sedimentation rate   Ammonia   Methylmalonic acid, serum   Vitamin B1   B12 and Folate Panel   Comprehensive metabolic panel   CBC   Ambulatory referral to Neuropsychology   Cc: Dr Yetta Barre.  Cc: Etta Grandchild, MD,    Naomie Dean, MD  [facility] [address] [place], [place] [address]  Phone [phone] Fax [phone]

## 2018-06-25 LAB — COMPREHENSIVE METABOLIC PANEL
ALBUMIN: 4.7 g/dL (ref 3.7–4.7)
ALK PHOS: 109 IU/L (ref 39–117)
ALT: 16 IU/L (ref 0–44)
AST: 25 IU/L (ref 0–40)
Albumin/Globulin Ratio: 2.9 — ABNORMAL HIGH (ref 1.2–2.2)
BUN / CREAT RATIO: 15 (ref 10–24)
BUN: 21 mg/dL (ref 8–27)
Bilirubin Total: 0.6 mg/dL (ref 0.0–1.2)
CO2: 18 mmol/L — ABNORMAL LOW (ref 20–29)
Calcium: 9.3 mg/dL (ref 8.6–10.2)
Chloride: 108 mmol/L — ABNORMAL HIGH (ref 96–106)
Creatinine, Ser: 1.38 mg/dL — ABNORMAL HIGH (ref 0.76–1.27)
GFR calc Af Amer: 58 mL/min/{1.73_m2} — ABNORMAL LOW (ref >59)
GFR calc non Af Amer: 50 mL/min/{1.73_m2} — ABNORMAL LOW (ref >59)
Globulin, Total: 1.6 g/dL (ref 1.5–4.5)
Glucose: 79 mg/dL (ref 65–99)
Potassium: 4.5 mmol/L (ref 3.5–5.2)
Sodium: 143 mmol/L (ref 134–144)
Total Protein: 6.3 g/dL (ref 6.0–8.5)

## 2018-06-25 LAB — TSH: TSH: 2.96 u[IU]/mL (ref 0.450–4.500)

## 2018-06-25 LAB — AMMONIA: Ammonia: 47 ug/dL (ref 31–169)

## 2018-06-25 LAB — CBC
Hematocrit: 35 % — ABNORMAL LOW (ref 37.5–51.0)
Hemoglobin: 12.4 g/dL — ABNORMAL LOW (ref 13.0–17.7)
MCH: 39.1 pg — ABNORMAL HIGH (ref 26.6–33.0)
MCHC: 35.4 g/dL (ref 31.5–35.7)
MCV: 110 fL — ABNORMAL HIGH (ref 79–97)
Platelets: 125 10*3/uL — ABNORMAL LOW (ref 150–450)
RBC: 3.17 x10E6/uL — ABNORMAL LOW (ref 4.14–5.80)
RDW: 16.1 % — ABNORMAL HIGH (ref 11.6–15.4)
WBC: 6.5 10*3/uL (ref 3.4–10.8)

## 2018-06-25 LAB — B12 AND FOLATE PANEL
Folate: 4.1 ng/mL (ref >3.0)
Vitamin B-12: 133 pg/mL — ABNORMAL LOW (ref 232–1245)

## 2018-06-25 LAB — METHYLMALONIC ACID, SERUM: Methylmalonic Acid: 3630 nmol/L — ABNORMAL HIGH (ref 0–378)

## 2018-06-25 LAB — VITAMIN B1: Thiamine: 168 nmol/L (ref 66.5–200.0)

## 2018-06-25 LAB — SEDIMENTATION RATE: Sed Rate: 2 mm/hr (ref 0–30)

## 2018-06-25 LAB — RPR: RPR Ser Ql: NONREACTIVE

## 2018-06-26 ENCOUNTER — Telehealth: Payer: Self-pay | Admitting: Neurology

## 2018-06-26 NOTE — Telephone Encounter (Signed)
Severe B12 deficiency 133. Needs b12 shots weekly for 4 weeks and then once a month for 6 months recheck B12 in 8 weeks. Can have it in our office but also I am sure his pcp offers as well their choice but needs to start asap with long-termB12 deficiency even after the 6 months of shots.  B12 deficiency can cause weakness, fatigue, easy bruising or bleeding,sore tongue, stomach upset, weight loss, and diarrhea or constipation, tingling or numbness to the fingers and toes, difficulty walking, mood changes, depression, memory loss, disorientation and, in severe cases, dementia.

## 2018-06-27 ENCOUNTER — Telehealth: Payer: Self-pay

## 2018-06-27 ENCOUNTER — Telehealth: Payer: Self-pay | Admitting: Neurology

## 2018-06-27 ENCOUNTER — Encounter: Payer: Self-pay | Admitting: Neurology

## 2018-06-27 DIAGNOSIS — E538 Deficiency of other specified B group vitamins: Secondary | ICD-10-CM

## 2018-06-27 MED ORDER — CYANOCOBALAMIN 1000 MCG/ML IJ SOLN: 1000.0000 ug | INTRAMUSCULAR | Status: DC

## 2018-06-27 NOTE — Telephone Encounter (Signed)
Bruce Jordan, They would like to go to [place] for formal neuropsych testing, can we call in the morning and see if we can get them an appointment before the office (or any offices) possibly start shutting down?

## 2018-06-27 NOTE — Telephone Encounter (Signed)
Spoke to Dr. Lucia Gaskins with [facility] and she stated that pt is B12 and would like him to have B12 injection weekly for 4 weeks and then monthly for 6 months. [facility] is able to give the injection however pt and spouse stated that our office is closer and wanted to know if we could give in our office.  Inform Dr. Lucia Gaskins that we would be able to do them in our office.   Contacted pt and spoke to spouse. Pt is scheduled for this Friday 06/29/2018 for his first b12.   Order for 1st 4 b12 injections

## 2018-06-27 NOTE — Telephone Encounter (Signed)
I have reviewed and agree.

## 2018-06-27 NOTE — Telephone Encounter (Signed)
I spoke with patient and wife, B12 shots to be had at their pcp.

## 2018-06-27 NOTE — Telephone Encounter (Signed)
Spoke to patient and wife, they would like B12 injections at Dr. Yetta Barre' office. Spoke to [place] who was lovely, she will reach out to patient to schedule.

## 2018-06-28 ENCOUNTER — Telehealth: Payer: Self-pay | Admitting: Neurology

## 2018-06-28 NOTE — Telephone Encounter (Signed)
Medicare order sent to [facility]. No auth they will reach out to the pt to schedule.  °

## 2018-06-29 ENCOUNTER — Other Ambulatory Visit: Payer: Self-pay

## 2018-06-29 ENCOUNTER — Ambulatory Visit (INDEPENDENT_AMBULATORY_CARE_PROVIDER_SITE_OTHER): Payer: Medicare Other | Admitting: *Deleted

## 2018-06-29 DIAGNOSIS — E538 Deficiency of other specified B group vitamins: Secondary | ICD-10-CM | POA: Diagnosis not present

## 2018-06-29 MED ORDER — CYANOCOBALAMIN 1000 MCG/ML IJ SOLN
1000.0000 ug | Freq: Once | INTRAMUSCULAR | Status: AC
  Administered 2018-06-29: 1000 ug via INTRAMUSCULAR

## 2018-06-29 NOTE — Progress Notes (Signed)
Medical treatment/procedure(s) were performed by non-physician practitioner and as supervising physician I was immediately available for consultation/collaboration. I agree with above. Demetries Coia A Brexley Cutshaw, MD  

## 2018-06-29 NOTE — Telephone Encounter (Signed)
Referral has been faxed and I spoke to patient about on 06/27/2018.

## 2018-07-06 ENCOUNTER — Ambulatory Visit: Payer: Medicare Other

## 2018-07-09 ENCOUNTER — Encounter: Payer: Self-pay | Admitting: Internal Medicine

## 2018-07-09 ENCOUNTER — Other Ambulatory Visit: Payer: Self-pay | Admitting: Internal Medicine

## 2018-07-09 DIAGNOSIS — F418 Other specified anxiety disorders: Secondary | ICD-10-CM

## 2018-07-09 MED ORDER — CITALOPRAM HYDROBROMIDE 10 MG PO TABS: 10.0000 mg | ORAL_TABLET | Freq: Every day | ORAL | 1 refills | Status: DC

## 2018-07-13 ENCOUNTER — Ambulatory Visit: Payer: Medicare Other

## 2018-07-16 ENCOUNTER — Encounter: Payer: Self-pay | Admitting: Internal Medicine

## 2018-07-19 ENCOUNTER — Ambulatory Visit: Payer: Medicare Other

## 2018-07-24 DIAGNOSIS — R41840 Attention and concentration deficit: Secondary | ICD-10-CM | POA: Diagnosis not present

## 2018-07-24 DIAGNOSIS — R413 Other amnesia: Secondary | ICD-10-CM | POA: Diagnosis not present

## 2018-08-07 DIAGNOSIS — R413 Other amnesia: Secondary | ICD-10-CM | POA: Diagnosis not present

## 2018-08-07 DIAGNOSIS — R41840 Attention and concentration deficit: Secondary | ICD-10-CM | POA: Diagnosis not present

## 2018-08-07 DIAGNOSIS — Z733 Stress, not elsewhere classified: Secondary | ICD-10-CM | POA: Diagnosis not present

## 2018-09-11 DIAGNOSIS — H1789 Other corneal scars and opacities: Secondary | ICD-10-CM | POA: Diagnosis not present

## 2018-09-11 DIAGNOSIS — H524 Presbyopia: Secondary | ICD-10-CM | POA: Diagnosis not present

## 2018-09-11 DIAGNOSIS — H25013 Cortical age-related cataract, bilateral: Secondary | ICD-10-CM | POA: Diagnosis not present

## 2018-09-11 DIAGNOSIS — H353131 Nonexudative age-related macular degeneration, bilateral, early dry stage: Secondary | ICD-10-CM | POA: Diagnosis not present

## 2018-09-17 ENCOUNTER — Other Ambulatory Visit: Payer: Medicare Other

## 2018-09-17 ENCOUNTER — Other Ambulatory Visit: Payer: Self-pay | Admitting: Internal Medicine

## 2018-09-17 DIAGNOSIS — M19012 Primary osteoarthritis, left shoulder: Secondary | ICD-10-CM

## 2018-09-17 DIAGNOSIS — M19011 Primary osteoarthritis, right shoulder: Secondary | ICD-10-CM

## 2018-09-17 MED ORDER — MELOXICAM 15 MG PO TABS: 15.0000 mg | ORAL_TABLET | Freq: Every day | ORAL | 0 refills | Status: DC

## 2018-09-24 ENCOUNTER — Telehealth: Payer: Self-pay

## 2018-09-24 NOTE — Telephone Encounter (Signed)
Pt scheduled for weekly and 1st month injection.

## 2018-09-24 NOTE — Telephone Encounter (Signed)
Copied from [place] [id number]. Topic: General - Other >> Sep 21, 2018  4:27 PM Leward Quan A wrote: Reason for CRM: Patient wife returned a call to say that the only days that he will be available will be on Thursdays and Fridays for now in order to do his B-12 injections. Please advise Ph# [phone]  Per dr Jaynee Eagles advisement (back in march), patient needed to have 4 weekly  b12 injections and then switch to monthly injections for the following 6 months----patient started with first injection on 3/20 but was probably delayed with covid19 restrictions---routing to scheduling, can we call patient and have him restart b12 injections weekly for the next 4 weeks and then can switch to monthly----any day during the week is fine for b12 injections, but for now, we will do in parking lot, I just need to know what patient will be driving, make and color of vehicle----thanks

## 2018-09-27 ENCOUNTER — Ambulatory Visit (INDEPENDENT_AMBULATORY_CARE_PROVIDER_SITE_OTHER): Payer: Medicare Other

## 2018-09-27 ENCOUNTER — Other Ambulatory Visit: Payer: Self-pay

## 2018-09-27 DIAGNOSIS — E538 Deficiency of other specified B group vitamins: Secondary | ICD-10-CM | POA: Diagnosis not present

## 2018-09-27 MED ORDER — CYANOCOBALAMIN 1000 MCG/ML IJ SOLN
1000.0000 ug | Freq: Once | INTRAMUSCULAR | Status: AC
  Administered 2018-09-27: 1000 ug via INTRAMUSCULAR

## 2018-09-27 NOTE — Progress Notes (Signed)
I have reviewed and agree.

## 2018-10-04 ENCOUNTER — Ambulatory Visit (INDEPENDENT_AMBULATORY_CARE_PROVIDER_SITE_OTHER): Payer: Medicare Other

## 2018-10-04 DIAGNOSIS — E538 Deficiency of other specified B group vitamins: Secondary | ICD-10-CM

## 2018-10-04 MED ORDER — CYANOCOBALAMIN 1000 MCG/ML IJ SOLN
1000.0000 ug | Freq: Once | INTRAMUSCULAR | Status: AC
  Administered 2018-10-04: 1000 ug via INTRAMUSCULAR

## 2018-10-04 NOTE — Progress Notes (Signed)
I have reviewed and agree.

## 2018-10-11 ENCOUNTER — Other Ambulatory Visit: Payer: Self-pay

## 2018-10-11 ENCOUNTER — Ambulatory Visit (INDEPENDENT_AMBULATORY_CARE_PROVIDER_SITE_OTHER): Payer: Medicare Other

## 2018-10-11 DIAGNOSIS — E538 Deficiency of other specified B group vitamins: Secondary | ICD-10-CM | POA: Diagnosis not present

## 2018-10-11 MED ORDER — CYANOCOBALAMIN 1000 MCG/ML IJ SOLN
1000.0000 ug | Freq: Once | INTRAMUSCULAR | Status: AC
  Administered 2018-10-11: 1000 ug via INTRAMUSCULAR

## 2018-10-11 NOTE — Progress Notes (Signed)
I have reviewed and agree.

## 2018-10-14 ENCOUNTER — Encounter: Payer: Self-pay | Admitting: Internal Medicine

## 2018-10-15 ENCOUNTER — Other Ambulatory Visit: Payer: Self-pay | Admitting: Internal Medicine

## 2018-10-15 DIAGNOSIS — I10 Essential (primary) hypertension: Secondary | ICD-10-CM

## 2018-10-15 DIAGNOSIS — I2510 Atherosclerotic heart disease of native coronary artery without angina pectoris: Secondary | ICD-10-CM

## 2018-10-15 MED ORDER — METOPROLOL TARTRATE 25 MG PO TABS: 12.5000 mg | ORAL_TABLET | Freq: Two times a day (BID) | ORAL | 1 refills | Status: DC

## 2018-10-15 MED ORDER — LISINOPRIL 20 MG PO TABS: 20.0000 mg | ORAL_TABLET | Freq: Every day | ORAL | 1 refills | Status: DC

## 2018-10-18 ENCOUNTER — Ambulatory Visit: Payer: Medicare Other

## 2018-10-18 ENCOUNTER — Other Ambulatory Visit: Payer: Self-pay

## 2018-10-22 ENCOUNTER — Encounter: Payer: Self-pay | Admitting: Internal Medicine

## 2018-10-28 ENCOUNTER — Encounter: Payer: Self-pay | Admitting: Internal Medicine

## 2018-10-29 ENCOUNTER — Other Ambulatory Visit: Payer: Self-pay | Admitting: Internal Medicine

## 2018-11-02 ENCOUNTER — Encounter: Payer: Self-pay | Admitting: Internal Medicine

## 2018-11-02 MED ORDER — ATORVASTATIN CALCIUM 40 MG PO TABS: 40.0000 mg | ORAL_TABLET | Freq: Every day | ORAL | 1 refills | Status: DC

## 2018-11-02 MED ORDER — EPINEPHRINE 0.3 MG/0.3ML IJ SOAJ: 0.3000 mg | Freq: Once | INTRAMUSCULAR | 0 refills | Status: AC

## 2018-11-22 ENCOUNTER — Ambulatory Visit (INDEPENDENT_AMBULATORY_CARE_PROVIDER_SITE_OTHER): Payer: Medicare Other

## 2018-11-22 DIAGNOSIS — E538 Deficiency of other specified B group vitamins: Secondary | ICD-10-CM | POA: Diagnosis not present

## 2018-11-22 MED ORDER — CYANOCOBALAMIN 1000 MCG/ML IJ SOLN
1000.0000 ug | Freq: Once | INTRAMUSCULAR | Status: AC
  Administered 2018-11-22: 14:00:00 1000 ug via INTRAMUSCULAR

## 2018-11-22 NOTE — Progress Notes (Signed)
I have reviewed and agree.

## 2018-11-26 ENCOUNTER — Encounter: Payer: Self-pay | Admitting: Internal Medicine

## 2018-12-04 ENCOUNTER — Encounter: Payer: Self-pay | Admitting: Internal Medicine

## 2018-12-04 ENCOUNTER — Other Ambulatory Visit (INDEPENDENT_AMBULATORY_CARE_PROVIDER_SITE_OTHER): Payer: Medicare Other

## 2018-12-04 ENCOUNTER — Other Ambulatory Visit: Payer: Self-pay

## 2018-12-04 ENCOUNTER — Ambulatory Visit (INDEPENDENT_AMBULATORY_CARE_PROVIDER_SITE_OTHER): Payer: Medicare Other | Admitting: Internal Medicine

## 2018-12-04 VITALS — BP 130/70 | HR 61 | Temp 97.8°F | Resp 16 | Ht 65.0 in | Wt 133.0 lb

## 2018-12-04 DIAGNOSIS — D513 Other dietary vitamin B12 deficiency anemia: Secondary | ICD-10-CM

## 2018-12-04 DIAGNOSIS — I2510 Atherosclerotic heart disease of native coronary artery without angina pectoris: Secondary | ICD-10-CM

## 2018-12-04 DIAGNOSIS — D500 Iron deficiency anemia secondary to blood loss (chronic): Secondary | ICD-10-CM | POA: Diagnosis not present

## 2018-12-04 DIAGNOSIS — E785 Hyperlipidemia, unspecified: Secondary | ICD-10-CM

## 2018-12-04 DIAGNOSIS — I10 Essential (primary) hypertension: Secondary | ICD-10-CM

## 2018-12-04 DIAGNOSIS — D520 Dietary folate deficiency anemia: Secondary | ICD-10-CM

## 2018-12-04 DIAGNOSIS — N400 Enlarged prostate without lower urinary tract symptoms: Secondary | ICD-10-CM

## 2018-12-04 DIAGNOSIS — R7303 Prediabetes: Secondary | ICD-10-CM

## 2018-12-04 HISTORY — DX: Other dietary vitamin B12 deficiency anemia: D51.3

## 2018-12-04 LAB — HEPATIC FUNCTION PANEL
ALT: 12 U/L (ref 0–53)
AST: 18 U/L (ref 0–37)
Albumin: 4.5 g/dL (ref 3.5–5.2)
Alkaline Phosphatase: 102 U/L (ref 39–117)
Bilirubin, Direct: 0.1 mg/dL (ref 0.0–0.3)
Total Bilirubin: 0.6 mg/dL (ref 0.2–1.2)
Total Protein: 6.5 g/dL (ref 6.0–8.3)

## 2018-12-04 LAB — CBC WITH DIFFERENTIAL/PLATELET
Basophils Absolute: 0.1 10*3/uL (ref 0.0–0.1)
Basophils Relative: 1 % (ref 0.0–3.0)
Eosinophils Absolute: 0.4 10*3/uL (ref 0.0–0.7)
Eosinophils Relative: 6.8 % — ABNORMAL HIGH (ref 0.0–5.0)
HCT: 39.4 % (ref 39.0–52.0)
Hemoglobin: 13.4 g/dL (ref 13.0–17.0)
Lymphocytes Relative: 7.7 % — ABNORMAL LOW (ref 12.0–46.0)
Lymphs Abs: 0.5 10*3/uL — ABNORMAL LOW (ref 0.7–4.0)
MCHC: 33.9 g/dL (ref 30.0–36.0)
MCV: 95.3 fl (ref 78.0–100.0)
Monocytes Absolute: 0.6 10*3/uL (ref 0.1–1.0)
Monocytes Relative: 9.9 % (ref 3.0–12.0)
Neutro Abs: 4.9 10*3/uL (ref 1.4–7.7)
Neutrophils Relative %: 74.6 % (ref 43.0–77.0)
Platelets: 142 10*3/uL — ABNORMAL LOW (ref 150.0–400.0)
RBC: 4.13 Mil/uL — ABNORMAL LOW (ref 4.22–5.81)
RDW: 13.8 % (ref 11.5–15.5)
WBC: 6.5 10*3/uL (ref 4.0–10.5)

## 2018-12-04 LAB — FOLATE: Folate: 4.3 ng/mL — ABNORMAL LOW (ref >5.9)

## 2018-12-04 LAB — PSA: PSA: 3.49 ng/mL (ref 0.10–4.00)

## 2018-12-04 LAB — IBC PANEL
Iron: 83 ug/dL (ref 42–165)
Saturation Ratios: 28.8 % (ref 20.0–50.0)
Transferrin: 206 mg/dL — ABNORMAL LOW (ref 212.0–360.0)

## 2018-12-04 LAB — LIPID PANEL
Cholesterol: 123 mg/dL (ref 0–200)
HDL: 44.2 mg/dL (ref >39.00)
LDL Cholesterol: 43 mg/dL (ref 0–99)
NonHDL: 78.71
Total CHOL/HDL Ratio: 3
Triglycerides: 179 mg/dL — ABNORMAL HIGH (ref 0.0–149.0)
VLDL: 35.8 mg/dL (ref 0.0–40.0)

## 2018-12-04 LAB — URINALYSIS, ROUTINE W REFLEX MICROSCOPIC
Bilirubin Urine: NEGATIVE
Hgb urine dipstick: NEGATIVE
Ketones, ur: NEGATIVE
Leukocytes,Ua: NEGATIVE
Nitrite: NEGATIVE
RBC / HPF: NONE SEEN (ref 0–?)
Specific Gravity, Urine: 1.015 (ref 1.000–1.030)
Total Protein, Urine: NEGATIVE
Urine Glucose: NEGATIVE
Urobilinogen, UA: 0.2 (ref 0.0–1.0)
pH: 5.5 (ref 5.0–8.0)

## 2018-12-04 LAB — BASIC METABOLIC PANEL
BUN: 22 mg/dL (ref 6–23)
CO2: 25 mEq/L (ref 19–32)
Calcium: 9.1 mg/dL (ref 8.4–10.5)
Chloride: 109 mEq/L (ref 96–112)
Creatinine, Ser: 1.4 mg/dL (ref 0.40–1.50)
GFR: 49.37 mL/min — ABNORMAL LOW (ref >60.00)
Glucose, Bld: 96 mg/dL (ref 70–99)
Potassium: 4.3 mEq/L (ref 3.5–5.1)
Sodium: 142 mEq/L (ref 135–145)

## 2018-12-04 LAB — HEMOGLOBIN A1C: Hgb A1c MFr Bld: 5.7 % (ref 4.6–6.5)

## 2018-12-04 LAB — TSH: TSH: 2.42 u[IU]/mL (ref 0.35–4.50)

## 2018-12-04 LAB — FERRITIN: Ferritin: 638.9 ng/mL — ABNORMAL HIGH (ref 22.0–322.0)

## 2018-12-04 LAB — VITAMIN B12: Vitamin B-12: >1500 pg/mL — ABNORMAL HIGH (ref 211–911)

## 2018-12-04 MED ORDER — FOLIC ACID 1 MG PO TABS: 1.0000 mg | ORAL_TABLET | Freq: Every day | ORAL | 1 refills | Status: DC

## 2018-12-04 NOTE — Progress Notes (Signed)
Subjective:  Patient ID: Bruce Mondrew T Robison Jr., male    DOB: 1946-03-28  Age: 73 y.o. MRN: [id number]  CC: Coronary Artery Disease, Hyperlipidemia, and Anemia   HPI Bruce Mondrew T Duhamel Jr. presents for f/up - His wife tells me he is taking 1000 micrograms of vitamin B-12 once a day.  He also says he is taking the iron supplement and is tolerating it well.  Outpatient Medications Prior to Visit  Medication Sig Dispense Refill   albuterol (PROVENTIL HFA;VENTOLIN HFA) 108 (90 Base) MCG/ACT inhaler Inhale 1-2 puffs into the lungs every 6 (six) hours as needed for wheezing or shortness of breath. 18 g 3   Ascorbic Acid (VITAMIN C) 100 MG tablet Take 100 mg by mouth daily.     aspirin EC 81 MG tablet Take 81 mg by mouth daily.     atorvastatin (LIPITOR) 40 MG tablet Take 1 tablet (40 mg total) by mouth daily. 90 tablet 1   Cetirizine HCl (ZYRTEC PO) Take 10 mg by mouth daily.     cholecalciferol (VITAMIN D) 400 units TABS tablet Take 400 Units by mouth daily.     citalopram (CELEXA) 10 MG tablet Take 1 tablet (10 mg total) by mouth daily. 90 tablet 1   clobetasol cream (TEMOVATE) 0.05 % Apply 1 application topically 2 (two) times daily. (Patient taking differently: Apply 1 application topically 2 (two) times daily as needed (rash). ) 60 g 3   diclofenac sodium (VOLTAREN) 1 % GEL Apply 2 g topically 4 (four) times daily as needed (pain).      EPINEPHrine (EPIPEN IJ) Inject as directed as directed.     ferrous sulfate 325 (65 FE) MG tablet Take 325 mg by mouth daily with breakfast.     lisinopril (ZESTRIL) 20 MG tablet Take 1 tablet (20 mg total) by mouth daily. 90 tablet 1   meloxicam (MOBIC) 15 MG tablet Take 1 tablet (15 mg total) by mouth daily. 90 tablet 0   metoprolol tartrate (LOPRESSOR) 25 MG tablet Take 0.5 tablets (12.5 mg total) by mouth 2 (two) times daily. 90 tablet 1   sildenafil (VIAGRA) 100 MG tablet TAKE 1 TABLET BY MOUTH ONCE DAILY AS NEEDED FOR ERECTILE DYSFUNCTION 10  tablet 5   No facility-administered medications prior to visit.     ROS Review of Systems  Constitutional: Negative for diaphoresis and fatigue.  HENT: Negative.  Negative for trouble swallowing.   Eyes: Negative.   Respiratory: Negative for cough, chest tightness, shortness of breath and wheezing.   Cardiovascular: Negative for chest pain, palpitations and leg swelling.  Gastrointestinal: Negative for abdominal pain, blood in stool, constipation, diarrhea, nausea and vomiting.  Endocrine: Negative.   Genitourinary: Negative.  Negative for difficulty urinating and dysuria.  Musculoskeletal: Negative.  Negative for arthralgias and myalgias.  Skin: Negative.  Negative for color change and pallor.  Neurological: Negative.  Negative for dizziness, weakness, light-headedness and numbness.  Hematological: Negative for adenopathy. Does not bruise/bleed easily.  Psychiatric/Behavioral: Negative.     Objective:  BP 130/70 (BP Location: Left Arm, Patient Position: Sitting, Cuff Size: Normal)    Pulse 61    Temp 97.8 F (36.6 C) (Oral)    Resp 16    Ht 5\' 5"  (1.651 m)    Wt 133 lb (60.3 kg)    SpO2 96%    BMI 22.13 kg/m   BP Readings from Last 3 Encounters:  12/04/18 130/70  06/21/18 (!) 155/85  04/12/18 138/86    Wt Readings  from Last 3 Encounters:  12/04/18 133 lb (60.3 kg)  06/21/18 133 lb (60.3 kg)  04/12/18 137 lb (62.1 kg)    Physical Exam Vitals signs reviewed.  HENT:     Nose: Nose normal.     Mouth/Throat:     Mouth: Mucous membranes are moist.  Eyes:     General: No scleral icterus.    Conjunctiva/sclera: Conjunctivae normal.  Neck:     Musculoskeletal: Normal range of motion. No muscular tenderness.  Cardiovascular:     Rate and Rhythm: Normal rate and regular rhythm.     Heart sounds: No murmur.  Pulmonary:     Effort: Pulmonary effort is normal. No respiratory distress.     Breath sounds: No stridor. No wheezing, rhonchi or rales.  Abdominal:     General:  Abdomen is flat. Bowel sounds are normal. There is no distension.     Palpations: Abdomen is soft. There is no hepatomegaly or splenomegaly.     Tenderness: There is no abdominal tenderness.  Musculoskeletal: Normal range of motion.        General: No swelling.  Lymphadenopathy:     Cervical: No cervical adenopathy.  Skin:    General: Skin is warm and dry.     Coloration: Skin is not pale.  Neurological:     General: No focal deficit present.     Mental Status: He is alert and oriented to person, place, and time. Mental status is at baseline.     Lab Results  Component Value Date   WBC 6.5 12/04/2018   HGB 13.4 12/04/2018   HCT 39.4 12/04/2018   PLT 142.0 (L) 12/04/2018   GLUCOSE 96 12/04/2018   CHOL 123 12/04/2018   TRIG 179.0 (H) 12/04/2018   HDL 44.20 12/04/2018   LDLDIRECT 77.0 10/04/2017   LDLCALC 43 12/04/2018   ALT 12 12/04/2018   AST 18 12/04/2018   NA 142 12/04/2018   K 4.3 12/04/2018   CL 109 12/04/2018   CREATININE 1.40 12/04/2018   BUN 22 12/04/2018   CO2 25 12/04/2018   TSH 2.42 12/04/2018   PSA 3.49 12/04/2018   HGBA1C 5.7 12/04/2018    Dg Chest 2 View  Result Date: 09/12/2017 CLINICAL DATA:  Cough, congestion, tightness on chest x 5 wks, HTN, hx of cardiac cath. EXAM: CHEST - 2 VIEW COMPARISON:  None. FINDINGS: Heart size is upper normal. 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 Lungs are clear. No pleural effusion or pneumothorax seen. 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 No acute or suspicious osseous finding. IMPRESSION: No active cardiopulmonary disease. No evidence of pneumonia or pulmonary edema. Electronically Signed   By: Bary RichardStan  Maynard M.D.   On: 09/12/2017 10:16    Assessment & Plan:   Greig Castillandrew was seen today for coronary artery disease, hyperlipidemia and anemia.  Diagnoses and all orders for this visit:  Coronary artery disease involving native coronary artery of native heart  without angina pectoris- No recent angina. Will address risk factor modifications. -     Lipid panel; Future  Essential hypertension- His BP is well controlled. -     Basic metabolic panel; Future -     Urinalysis, Routine w reflex microscopic; Future  Iron deficiency anemia due to chronic blood loss- His H and H and iron level are normal now. -     IBC panel; Future -     CBC with Differential/Platelet; Future -     Ferritin;  Future  Prediabetes -     Hemoglobin A1c; Future  Hyperlipidemia with target LDL less than 70- He has achieved his LDL goal is doing well on the statin. -     TSH; Future -     Hepatic function panel; Future  Benign prostatic hyperplasia without lower urinary tract symptoms- His PSA is stable which is reassuring he does not have prostate cancer.  He has no symptoms that need to be treated. -     PSA; Future  Other dietary vitamin B12 deficiency anemia- His B12 level is too high so I have asked him to decrease the B12 dose.  He has developed folate deficiency. -     Vitamin B12; Future -     Folate; Future  Dietary folate deficiency anemia -     folic acid (FOLVITE) 1 MG tablet; Take 1 tablet (1 mg total) by mouth daily.   I am having [name] start on folic acid. I am also having him maintain his aspirin EC, cholecalciferol, diclofenac sodium, ferrous sulfate, vitamin C, clobetasol cream, albuterol, EPINEPHrine (EPIPEN IJ), sildenafil, Cetirizine HCl (ZYRTEC PO), citalopram, meloxicam, metoprolol tartrate, lisinopril, and atorvastatin.  Meds ordered this encounter  Medications   folic acid (FOLVITE) 1 MG tablet    Sig: Take 1 tablet (1 mg total) by mouth daily.    Dispense:  90 tablet    Refill:  1     Follow-up: Return in about 4 months (around 04/05/2019).  Scarlette Calico, MD

## 2018-12-04 NOTE — Patient Instructions (Signed)
Vitamin B12 Deficiency Vitamin B12 deficiency occurs when the body does not have enough vitamin B12, which is an important vitamin. The body needs this vitamin:  To make red blood cells.  To make DNA. This is the genetic material inside cells.  To help the nerves work properly so they can carry messages from the brain to the body. Vitamin B12 deficiency can cause various health problems, such as a low red blood cell count (anemia) or nerve damage. What are the causes? This condition may be caused by:  Not eating enough foods that contain vitamin B12.  Not having enough stomach acid and digestive fluids to properly absorb vitamin B12 from the food that you eat.  Certain digestive system diseases that make it hard to absorb vitamin B12. These diseases include Crohn's disease, chronic pancreatitis, and cystic fibrosis.  A condition in which the body does not make enough of a protein (intrinsic factor), resulting in too few red blood cells (pernicious anemia).  Having a surgery in which part of the stomach or small intestine is removed.  Taking certain medicines that make it hard for the body to absorb vitamin B12. These medicines include: ? Heartburn medicines (antacids and proton pump inhibitors). ? Certain antibiotic medicines. ? Some medicines that are used to treat diabetes, tuberculosis, gout, or high cholesterol. What increases the risk? The following factors may make you more likely to develop a B12 deficiency:  Being older than age 50.  Eating a vegetarian or vegan diet, especially while you are pregnant.  Eating a poor diet while you are pregnant.  Taking certain medicines.  Having alcoholism. What are the signs or symptoms? In some cases, there are no symptoms of this condition. If the condition leads to anemia or nerve damage, various symptoms can occur, such as:  Weakness.  Fatigue.  Loss of appetite.  Weight loss.  Numbness or tingling in your hands and  feet.  Redness and burning of the tongue.  Confusion or memory problems.  Depression.  Sensory problems, such as color blindness, ringing in the ears, or loss of taste.  Diarrhea or constipation.  Trouble walking. If anemia is severe, symptoms can include:  Shortness of breath.  Dizziness.  Rapid heart rate (tachycardia). How is this diagnosed? This condition may be diagnosed with a blood test to measure the level of vitamin B12 in your blood. You may also have other tests, including:  A group of tests that measure certain characteristics of blood cells (complete blood count, CBC).  A blood test to measure intrinsic factor.  A procedure where a thin tube with a camera on the end is used to look into your stomach or intestines (endoscopy). Other tests may be needed to discover the cause of B12 deficiency. How is this treated? Treatment for this condition depends on the cause. This condition may be treated by:  Changing your eating and drinking habits, such as: ? Eating more foods that contain vitamin B12. ? Drinking less alcohol or no alcohol.  Getting vitamin B12 injections.  Taking vitamin B12 supplements. Your health care provider will tell you which dosage is best for you. Follow these instructions at home: Eating and drinking   Eat lots of healthy foods that contain vitamin B12, including: ? Meats and poultry. This includes beef, pork, chicken, turkey, and organ meats, such as liver. ? Seafood. This includes clams, rainbow trout, salmon, tuna, and haddock. ? Eggs. ? Cereal and dairy products that are fortified. This means that vitamin B12   has been added to the food. Check the label on the package to see if the food is fortified. The items listed above may not be a complete list of recommended foods and beverages. Contact a dietitian for more information. General instructions  Get any injections that are prescribed by your health care provider.  Take  supplements only as told by your health care provider. Follow the directions carefully.  Do not drink alcohol if your health care provider tells you not to. In some cases, you may only be asked to limit alcohol use.  Keep all follow-up visits as told by your health care provider. This is important. Contact a health care provider if:  Your symptoms come back. Get help right away if you:  Develop shortness of breath.  Have a rapid heart rate.  Have chest pain.  Become dizzy or lose consciousness. Summary  Vitamin B12 deficiency occurs when the body does not have enough vitamin B12.  The main causes of vitamin B12 deficiency include dietary deficiency, digestive diseases, pernicious anemia, and having a surgery in which part of the stomach or small intestine is removed.  In some cases, there are no symptoms of this condition. If the condition leads to anemia or nerve damage, various symptoms can occur, such as weakness, shortness of breath, and numbness.  Treatment may include getting vitamin B12 injections or taking vitamin B12 supplements. Eat lots of healthy foods that contain vitamin B12. This information is not intended to replace advice given to you by your health care provider. Make sure you discuss any questions you have with your health care provider. Document Released: 06/20/2011 Document Revised: 12/05/2017 Document Reviewed: 12/05/2017 Elsevier Patient Education  2020 Elsevier Inc[employer]  

## 2018-12-05 ENCOUNTER — Encounter: Payer: Self-pay | Admitting: Internal Medicine

## 2018-12-09 ENCOUNTER — Encounter: Payer: Self-pay | Admitting: Internal Medicine

## 2018-12-10 ENCOUNTER — Other Ambulatory Visit: Payer: Self-pay | Admitting: Internal Medicine

## 2018-12-10 DIAGNOSIS — I2510 Atherosclerotic heart disease of native coronary artery without angina pectoris: Secondary | ICD-10-CM

## 2018-12-10 DIAGNOSIS — I10 Essential (primary) hypertension: Secondary | ICD-10-CM

## 2018-12-10 MED ORDER — LISINOPRIL 20 MG PO TABS: 20.0000 mg | ORAL_TABLET | Freq: Every day | ORAL | 1 refills | Status: DC

## 2018-12-21 ENCOUNTER — Encounter: Payer: Self-pay | Admitting: Internal Medicine

## 2018-12-21 ENCOUNTER — Other Ambulatory Visit: Payer: Self-pay | Admitting: Internal Medicine

## 2018-12-21 DIAGNOSIS — M19011 Primary osteoarthritis, right shoulder: Secondary | ICD-10-CM

## 2018-12-21 MED ORDER — MELOXICAM 15 MG PO TABS: 15.0000 mg | ORAL_TABLET | Freq: Every day | ORAL | 1 refills | Status: DC

## 2019-01-06 DIAGNOSIS — Z23 Encounter for immunization: Secondary | ICD-10-CM | POA: Diagnosis not present

## 2019-01-07 ENCOUNTER — Other Ambulatory Visit: Payer: Self-pay | Admitting: Internal Medicine

## 2019-01-07 DIAGNOSIS — F418 Other specified anxiety disorders: Secondary | ICD-10-CM

## 2019-01-07 MED ORDER — CITALOPRAM HYDROBROMIDE 10 MG PO TABS: 10.0000 mg | ORAL_TABLET | Freq: Every day | ORAL | 1 refills | Status: DC

## 2019-01-10 ENCOUNTER — Other Ambulatory Visit: Payer: Self-pay | Admitting: Internal Medicine

## 2019-01-11 ENCOUNTER — Encounter: Payer: Self-pay | Admitting: Internal Medicine

## 2019-01-11 ENCOUNTER — Other Ambulatory Visit: Payer: Self-pay | Admitting: *Deleted

## 2019-01-11 DIAGNOSIS — F418 Other specified anxiety disorders: Secondary | ICD-10-CM

## 2019-01-11 MED ORDER — CITALOPRAM HYDROBROMIDE 10 MG PO TABS: 10.0000 mg | ORAL_TABLET | Freq: Every day | ORAL | 1 refills | Status: DC

## 2019-01-18 NOTE — Progress Notes (Signed)
Cardiology Office Note    Date:  01/21/2019   ID:  Bruce Mondrew T Pearman Jr., DOB 08-29-1945, MRN [id number]  PCP:  Etta GrandchildJones, Thomas L, MD  Cardiologist:  Jhalen Eley [place], MD    History of Present Illness:  Bruce Mondrew T Todisco Jr. is a 73 y.o. male seen for follow up CAD. He has a history of CAD s/p CABG in 2008 in [place] for failed PCI. This was a LIMA to the LAD. In 2013 he had PCI of a tortuous RCA. In April 2016 he had a low risk Cardiolite without evidence of ischemia. He has a history of HTN and dyslipidemia. 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  [place] moved [place] be closer to family. Now living in an Apartment with his wife.  He is inactive.  He denies any chest pain, SOB, palpitations, dizziness. Really no new complaints today.     Past Medical History:  Diagnosis Date  . Chronic ischemic heart disease   . Coronary artery disease   . Hyperlipidemia   . Hypertension   . IFG (impaired fasting glucose)   . Osteoarthritis     Past Surgical History:  Procedure Laterality Date  . CARDIAC CATHETERIZATION      Current Medications: Outpatient Medications Prior to Visit  Medication Sig Dispense Refill  . albuterol (PROVENTIL HFA;VENTOLIN HFA) 108 (90 Base) MCG/ACT inhaler Inhale 1-2 puffs into the lungs every 6 (six) hours as needed for wheezing or shortness of breath. 18 g 3  . Ascorbic Acid (VITAMIN C) 100 MG tablet Take 100 mg by mouth daily.    Marland Kitchen. aspirin EC 81 MG tablet Take 81 mg by mouth daily.    Marland Kitchen. atorvastatin (LIPITOR) 40 MG tablet Take 1 tablet (40 mg total) by mouth daily. 90 tablet 1  . Cetirizine HCl (ZYRTEC PO) Take 10 mg by mouth daily.    . cholecalciferol (VITAMIN D) 400 units TABS tablet Take 400 Units by mouth daily.    . citalopram (CELEXA) 10 MG tablet Take 1 tablet (10 mg total) by mouth daily. 90 tablet 1  . clobetasol cream (TEMOVATE) 0.05 % Apply 1 application topically 2 (two) times daily. (Patient taking differently:  Apply 1 application topically 2 (two) times daily as needed (rash). ) 60 g 3  . diclofenac sodium (VOLTAREN) 1 % GEL Apply 2 g topically 4 (four) times daily as needed (pain).     Marland Kitchen. EPINEPHrine (EPIPEN IJ) Inject as directed as directed.    . ferrous sulfate 325 (65 FE) MG tablet Take 325 mg by mouth daily with breakfast.    . folic acid (FOLVITE) 1 MG tablet Take 1 tablet (1 mg total) by mouth daily. 90 tablet 1  . lisinopril (ZESTRIL) 20 MG tablet Take 1 tablet (20 mg total) by mouth daily. 90 tablet 1  . meloxicam (MOBIC) 15 MG tablet Take 1 tablet (15 mg total) by mouth daily. 90 tablet 1  . metoprolol tartrate (LOPRESSOR) 25 MG tablet Take 0.5 tablets (12.5 mg total) by mouth 2 (two) times daily. 90 tablet 1  . sildenafil (VIAGRA) 100 MG tablet TAKE 1 TABLET BY MOUTH ONCE DAILY AS NEEDED FOR ERECTILE DYSFUNCTION 10 tablet 5   No facility-administered medications prior to visit.      Allergies:   Patient has no known allergies.   Social History   Socioeconomic History  . Marital status: Married    Spouse name: Not on file  . Number  of children: Not on file  . Years of education: Not on file  . Highest education level: Not on file  Occupational History  . Not on file  Social Needs  . Financial resource strain: Not on file  . Food insecurity    Worry: Not on file    Inability: Not on file  . Transportation needs    Medical: Not on file    Non-medical: Not on file  Tobacco Use  . Smoking status: Former Research scientist (life sciences)  . Smokeless tobacco: Never Used  Substance and Sexual Activity  . Alcohol use: No  . Drug use: No  . Sexual activity: Yes    Birth control/protection: None  Lifestyle  . Physical activity    Days per week: Not on file    Minutes per session: Not on file  . Stress: Not on file  Relationships  . Social Herbalist on phone: Not on file    Gets together: Not on file    Attends religious service: Not on file    Active member of club or organization: Not  on file    Attends meetings of clubs or organizations: Not on file    Relationship status: Not on file  Other Topics Concern  . Not on file  Social History Narrative  . Not on file     Family History:  The patient's family history includes Cancer in his father; Early death (age of onset: 2) in his brother; Early death (age of onset: 4) in his brother; Heart disease in his father and mother; Hypertension in his mother; Stroke in his brother.   ROS:   Please see the history of present illness.    ROS All other systems reviewed and are negative.   PHYSICAL EXAM:   VS:  BP 134/70 (BP Location: Left Arm, Patient Position: Sitting, Cuff Size: Normal)   Pulse (!) 55   Temp (!) 97 F (36.1 C)   Ht 5\' 5"  (1.651 m)   Wt 127 lb (57.6 kg)   BMI 21.13 kg/m    GENERAL:  Well appearing WM in NAD HEENT:  PERRL, EOMI, sclera are clear. Oropharynx is clear. NECK:  No jugular venous distention, carotid upstroke brisk and symmetric, no bruits, no thyromegaly or adenopathy LUNGS:  Clear to auscultation bilaterally CHEST:  Unremarkable HEART:  RRR with extrasystoles,  PMI not displaced or sustained,S1 and S2 within normal limits, no S3, no S4: no clicks, no rubs, no murmurs ABD:  Soft, nontender. BS +, no masses or bruits. No hepatomegaly, no splenomegaly EXT:  2 + pulses throughout, no edema, no cyanosis no clubbing SKIN:  Warm and dry.  No rashes NEURO:  Alert and oriented x 3. Cranial nerves II through XII intact. PSYCH:  Cognitively intact       Wt Readings from Last 3 Encounters:  01/21/19 127 lb (57.6 kg)  12/04/18 133 lb (60.3 kg)  06/21/18 133 lb (60.3 kg)      Studies/Labs Reviewed:   EKG:  EKG is  ordered today.  NSR with  PVCs. Nonspecific ST abnormality. I have personally reviewed and interpreted this study.    Recent Labs: 12/04/2018: ALT 12; BUN 22; Creatinine, Ser 1.40; Hemoglobin 13.4; Platelets 142.0; Potassium 4.3; Sodium 142; TSH 2.42   Lipid Panel     Component Value Date/Time   CHOL 123 12/04/2018 1534   TRIG 179.0 (H) 12/04/2018 1534   HDL 44.20 12/04/2018 1534   CHOLHDL 3 12/04/2018 1534  VLDL 35.8 12/04/2018 1534   LDLCALC 43 12/04/2018 1534   LDLDIRECT 77.0 10/04/2017 1607    Additional studies/ records that were reviewed today include:  Labs dated 08/24/15: glucose 177, creatinine 1.47. Otherwise CMET normal. Platelets 118K, otherwise CBC is normal. A1c 5.9%. TSH normal. Cholesterol 170, triglycerides 119, LDL 85, HDL 61. LDL particle number [id number]. Dated 05/26/16: cholesterol 144, triglycerides 98, HDL 62, LDL 62. A1c 5.7%. Creatinine 1.27. Hgb, TSH, ALT normal. Dated 10/04/17: cholesterol 162, triglycerides 240, HDL 70, LDL 77. A1c 5.7%. Creatinine 1.41. ALT normal. CBC normal.   ASSESSMENT:   1. CAD s/p CABG in 2008. Last Myoview in 2016 looked good. He is asymptomatic. Continue medical therapy with ASA, statin, and lopressor. 2. HTN well controlled continue metoprolol, lisinopril 3. Hypercholesterolemia. On high dose statin. LDL is at goal. Mildly elevated triglycerides. 4. CKD stage 3. Recommend he minimize use of NSAIDs.   Follow up in one year.    Medication Adjustments/Labs and Tests Ordered: Current medicines are reviewed at length with the patient today.  Concerns regarding medicines are outlined above.  Medication changes, Labs and Tests ordered today are listed in the Patient Instructions below. Patient Instructions  Minimize the use of Mobic and other nonsteroidal anti inflammatory medication. Use Tylenol preferably.       Signed, Eiden Bagot [place], MD  01/21/2019 10:29 AM    [facility] [address] [place], [place], [address] [phone]

## 2019-01-21 ENCOUNTER — Ambulatory Visit (INDEPENDENT_AMBULATORY_CARE_PROVIDER_SITE_OTHER): Payer: Medicare Other | Admitting: Cardiology

## 2019-01-21 ENCOUNTER — Encounter: Payer: Self-pay | Admitting: Cardiology

## 2019-01-21 ENCOUNTER — Other Ambulatory Visit: Payer: Self-pay

## 2019-01-21 VITALS — BP 134/70 | HR 55 | Temp 97.0°F | Ht 65.0 in | Wt 127.0 lb

## 2019-01-21 DIAGNOSIS — I10 Essential (primary) hypertension: Secondary | ICD-10-CM

## 2019-01-21 DIAGNOSIS — E7800 Pure hypercholesterolemia, unspecified: Secondary | ICD-10-CM

## 2019-01-21 DIAGNOSIS — I2510 Atherosclerotic heart disease of native coronary artery without angina pectoris: Secondary | ICD-10-CM

## 2019-01-21 DIAGNOSIS — N1831 Chronic kidney disease, stage 3a: Secondary | ICD-10-CM

## 2019-01-21 DIAGNOSIS — I25118 Atherosclerotic heart disease of native coronary artery with other forms of angina pectoris: Secondary | ICD-10-CM | POA: Diagnosis not present

## 2019-01-21 NOTE — Patient Instructions (Signed)
Minimize the use of Mobic and other nonsteroidal anti inflammatory medication. Use Tylenol preferably.

## 2019-02-19 ENCOUNTER — Other Ambulatory Visit: Payer: Self-pay

## 2019-02-19 ENCOUNTER — Ambulatory Visit (INDEPENDENT_AMBULATORY_CARE_PROVIDER_SITE_OTHER): Payer: Medicare Other | Admitting: Internal Medicine

## 2019-02-19 ENCOUNTER — Encounter: Payer: Self-pay | Admitting: Internal Medicine

## 2019-02-19 VITALS — BP 132/84 | HR 64 | Temp 98.1°F | Resp 16 | Ht 65.0 in | Wt 135.0 lb

## 2019-02-19 DIAGNOSIS — G8929 Other chronic pain: Secondary | ICD-10-CM | POA: Diagnosis not present

## 2019-02-19 DIAGNOSIS — I10 Essential (primary) hypertension: Secondary | ICD-10-CM | POA: Diagnosis not present

## 2019-02-19 DIAGNOSIS — M25512 Pain in left shoulder: Secondary | ICD-10-CM | POA: Diagnosis not present

## 2019-02-19 DIAGNOSIS — I2510 Atherosclerotic heart disease of native coronary artery without angina pectoris: Secondary | ICD-10-CM

## 2019-02-19 DIAGNOSIS — M12811 Other specific arthropathies, not elsewhere classified, right shoulder: Secondary | ICD-10-CM | POA: Insufficient documentation

## 2019-02-19 DIAGNOSIS — M25511 Pain in right shoulder: Secondary | ICD-10-CM | POA: Diagnosis not present

## 2019-02-19 DIAGNOSIS — S09302A Unspecified injury of left middle and inner ear, initial encounter: Secondary | ICD-10-CM | POA: Insufficient documentation

## 2019-02-19 NOTE — Progress Notes (Signed)
Subjective:  Patient ID: Bruce Schewe., male    DOB: [birth year]  Age: 73 y.o. MRN: [id number]  CC: Hypertension, Osteoarthritis, and Otalgia   HPI Bruce Russom. presents for f/up - He tells me that 2 days ago he used a Q-tip in his left ear and after using it he noticed some bloody discharge from the left ear.  He never experienced pain and he says the symptoms have resolved.  He wants to have his ears checked.  He also complains of persistent shoulder pain and wants to see a specialist about this.  Outpatient Medications Prior to Visit  Medication Sig Dispense Refill  . albuterol (PROVENTIL HFA;VENTOLIN HFA) 108 (90 Base) MCG/ACT inhaler Inhale 1-2 puffs into the lungs every 6 (six) hours as needed for wheezing or shortness of breath. 18 g 3  . Ascorbic Acid (VITAMIN C) 100 MG tablet Take 100 mg by mouth daily.    Marland Kitchen aspirin EC 81 MG tablet Take 81 mg by mouth daily.    Marland Kitchen atorvastatin (LIPITOR) 40 MG tablet Take 1 tablet (40 mg total) by mouth daily. 90 tablet 1  . Cetirizine HCl (ZYRTEC PO) Take 10 mg by mouth daily.    . cholecalciferol (VITAMIN D) 400 units TABS tablet Take 400 Units by mouth daily.    . citalopram (CELEXA) 10 MG tablet Take 1 tablet (10 mg total) by mouth daily. 90 tablet 1  . clobetasol cream (TEMOVATE) 0.63 % Apply 1 application topically 2 (two) times daily. (Patient taking differently: Apply 1 application topically 2 (two) times daily as needed (rash). ) 60 g 3  . diclofenac sodium (VOLTAREN) 1 % GEL Apply 2 g topically 4 (four) times daily as needed (pain).     Marland Kitchen EPINEPHrine (EPIPEN IJ) Inject as directed as directed.    . ferrous sulfate 325 (65 FE) MG tablet Take 325 mg by mouth daily with breakfast.    . folic acid (FOLVITE) 1 MG tablet Take 1 tablet (1 mg total) by mouth daily. 90 tablet 1  . lisinopril (ZESTRIL) 20 MG tablet Take 1 tablet (20 mg total) by mouth daily. 90 tablet 1  . meloxicam (MOBIC) 15 MG tablet Take 1 tablet (15 mg total) by  mouth daily. 90 tablet 1  . metoprolol tartrate (LOPRESSOR) 25 MG tablet Take 0.5 tablets (12.5 mg total) by mouth 2 (two) times daily. 90 tablet 1  . sildenafil (VIAGRA) 100 MG tablet TAKE 1 TABLET BY MOUTH ONCE DAILY AS NEEDED FOR ERECTILE DYSFUNCTION 10 tablet 5   No facility-administered medications prior to visit.     ROS Review of Systems  Constitutional: Negative.  Negative for chills and fever.  HENT: Negative for ear discharge, ear pain, hearing loss and sinus pressure.   Eyes: Negative.   Respiratory: Negative for cough and shortness of breath.   Cardiovascular: Negative for chest pain, palpitations and leg swelling.  Gastrointestinal: Negative for abdominal pain, diarrhea, nausea and vomiting.  Endocrine: Negative.   Genitourinary: Negative.  Negative for difficulty urinating.  Musculoskeletal: Positive for arthralgias. Negative for myalgias.  Skin: Negative.  Negative for color change and pallor.  Neurological: Negative.  Negative for dizziness, weakness, light-headedness and headaches.  Hematological: Negative for adenopathy. Does not bruise/bleed easily.  Psychiatric/Behavioral: Negative.     Objective:  BP 132/84 (BP Location: Left Arm, Patient Position: Sitting, Cuff Size: Normal)   Pulse 64   Temp 98.1 F (36.7 C) (Oral)   Resp 16   Ht 5'  5" (1.651 m)   Wt 135 lb (61.2 kg)   SpO2 99%   BMI 22.47 kg/m   BP Readings from Last 3 Encounters:  02/19/19 132/84  01/21/19 134/70  12/04/18 130/70    Wt Readings from Last 3 Encounters:  02/19/19 135 lb (61.2 kg)  01/21/19 127 lb (57.6 kg)  12/04/18 133 lb (60.3 kg)    Physical Exam HENT:     Right Ear: Hearing, tympanic membrane, ear canal and external ear normal. No drainage, swelling or tenderness. There is no impacted cerumen.     Left Ear: Hearing, tympanic membrane, ear canal and external ear normal. No drainage, swelling or tenderness. There is no impacted cerumen.     Nose: Nose normal.      Mouth/Throat:     Mouth: Mucous membranes are moist.  Eyes:     General: No scleral icterus.    Conjunctiva/sclera: Conjunctivae normal.  Neck:     Musculoskeletal: Neck supple.  Cardiovascular:     Rate and Rhythm: Normal rate and regular rhythm.     Heart sounds: No murmur.  Pulmonary:     Effort: Pulmonary effort is normal.     Breath sounds: No stridor. No wheezing, rhonchi or rales.  Abdominal:     General: Abdomen is flat. Bowel sounds are normal.     Palpations: There is no hepatomegaly or splenomegaly.     Tenderness: There is no abdominal tenderness.  Musculoskeletal: Normal range of motion.     Right shoulder: Normal.     Left shoulder: Normal.     Right lower leg: No edema.     Left lower leg: No edema.  Lymphadenopathy:     Cervical: No cervical adenopathy.  Skin:    General: Skin is warm and dry.     Lab Results  Component Value Date   WBC 6.5 12/04/2018   HGB 13.4 12/04/2018   HCT 39.4 12/04/2018   PLT 142.0 (L) 12/04/2018   GLUCOSE 96 12/04/2018   CHOL 123 12/04/2018   TRIG 179.0 (H) 12/04/2018   HDL 44.20 12/04/2018   LDLDIRECT 77.0 10/04/2017   LDLCALC 43 12/04/2018   ALT 12 12/04/2018   AST 18 12/04/2018   NA 142 12/04/2018   K 4.3 12/04/2018   CL 109 12/04/2018   CREATININE 1.40 12/04/2018   BUN 22 12/04/2018   CO2 25 12/04/2018   TSH 2.42 12/04/2018   PSA 3.49 12/04/2018   HGBA1C 5.7 12/04/2018    Dg Chest 2 View  Result Date: 09/12/2017 CLINICAL DATA:  Cough, congestion, tightness on chest x 5 wks, HTN, hx of cardiac cath. EXAM: CHEST - 2 VIEW COMPARISON:  None. FINDINGS: Heart size is upper normal. 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 Lungs are clear. No pleural effusion or pneumothorax seen. 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 No acute or suspicious osseous finding. IMPRESSION: No active cardiopulmonary disease. No evidence of pneumonia or pulmonary edema.  Electronically Signed   By: Bary RichardStan  Maynard M.D.   On: 09/12/2017 10:16    Assessment & Plan:   Bruce Jordan was seen today for hypertension, osteoarthritis and otalgia.  Diagnoses and all orders for this visit:  Essential hypertension- His blood pressure is adequately well controlled.  Chronic pain of both shoulders- I think this is either osteoarthritis or rotator cuff disorder.  He will see sports medicine. -     Ambulatory referral to Sports Medicine  Eardrum trauma, left, initial encounter- He  describes a history of ear trauma but the symptoms have resolved and the examination of the area today is normal.  Reassurance offered.   I am having Bruce Castilla [name] "[name]" maintain his aspirin EC, cholecalciferol, diclofenac sodium, ferrous sulfate, vitamin C, clobetasol cream, albuterol, EPINEPHrine (EPIPEN IJ), sildenafil, Cetirizine HCl (ZYRTEC PO), metoprolol tartrate, atorvastatin, folic acid, lisinopril, meloxicam, and citalopram.  No orders of the defined types were placed in this encounter.    Follow-up: No follow-ups on file.  Sanda Linger, MD

## 2019-02-20 ENCOUNTER — Encounter: Payer: Self-pay | Admitting: Internal Medicine

## 2019-02-20 NOTE — Patient Instructions (Signed)

## 2019-03-19 ENCOUNTER — Other Ambulatory Visit: Payer: Self-pay

## 2019-03-19 ENCOUNTER — Encounter: Payer: Self-pay | Admitting: Family Medicine

## 2019-03-19 ENCOUNTER — Ambulatory Visit (INDEPENDENT_AMBULATORY_CARE_PROVIDER_SITE_OTHER): Payer: Medicare Other | Admitting: Family Medicine

## 2019-03-19 ENCOUNTER — Ambulatory Visit: Payer: Self-pay

## 2019-03-19 VITALS — BP 110/80 | HR 48 | Wt 139.0 lb

## 2019-03-19 DIAGNOSIS — M25511 Pain in right shoulder: Secondary | ICD-10-CM | POA: Diagnosis not present

## 2019-03-19 DIAGNOSIS — G8929 Other chronic pain: Secondary | ICD-10-CM | POA: Diagnosis not present

## 2019-03-19 DIAGNOSIS — M25512 Pain in left shoulder: Secondary | ICD-10-CM

## 2019-03-19 DIAGNOSIS — M19011 Primary osteoarthritis, right shoulder: Secondary | ICD-10-CM

## 2019-03-19 DIAGNOSIS — I2510 Atherosclerotic heart disease of native coronary artery without angina pectoris: Secondary | ICD-10-CM | POA: Diagnosis not present

## 2019-03-19 DIAGNOSIS — M19012 Primary osteoarthritis, left shoulder: Secondary | ICD-10-CM | POA: Diagnosis not present

## 2019-03-19 NOTE — Progress Notes (Signed)
[facility] [address] [place], [place] [address] Phone: [phone] Subjective:   I Bruce Jordan am serving as a Neurosurgeon for Dr. Antoine Jordan.   This visit occurred during the SARS-CoV-2 public health emergency.  Safety protocols were in place, including screening questions prior to the visit, additional usage of staff PPE, and extensive cleaning of exam room while observing appropriate contact time as indicated for disinfecting solutions.   CC: Bilateral shoulder pain  QIW:[id number]  Bruce [facility]. is a 73 y.o. male coming in with complaint of bilateral shoulder pain. C7 neck pain as well. Patient can feel the shoulders popping.   Onset- chronic  Location - posterior, anterior  Duration-  Character- sharp  Aggravating factors- flexion, ADL Reliving factors-  Therapies tried- topical patch  Severity-  7/10     Past Medical History:  Diagnosis Date  . Chronic ischemic heart disease   . Coronary artery disease   . Hyperlipidemia   . Hypertension   . IFG (impaired fasting glucose)   . Osteoarthritis    Past Surgical History:  Procedure Laterality Date  . CARDIAC CATHETERIZATION     Social History   Socioeconomic History  . Marital status: Married    Spouse name: Not on file  . Number of children: Not on file  . Years of education: Not on file  . Highest education level: Not on file  Occupational History  . Not on file  Social Needs  . Financial resource strain: Not on file  . Food insecurity    Worry: Not on file    Inability: Not on file  . Transportation needs    Medical: Not on file    Non-medical: Not on file  Tobacco Use  . Smoking status: Former Games developer  . Smokeless tobacco: Never Used  Substance and Sexual Activity  . Alcohol use: No  . Drug use: No  . Sexual activity: Yes    Birth control/protection: None  Lifestyle  . Physical activity    Days per week: Not on file    Minutes per session: Not on file  .  Stress: Not on file  Relationships  . Social Musician on phone: Not on file    Gets together: Not on file    Attends religious service: Not on file    Active member of club or organization: Not on file    Attends meetings of clubs or organizations: Not on file    Relationship status: Not on file  Other Topics Concern  . Not on file  Social History Narrative  . Not on file   No Known Allergies Family History  Problem Relation Age of Onset  . Hypertension Mother   . Heart disease Mother   . Heart disease Father   . Cancer Father   . Early death Brother 30       MVA  . Stroke Brother   . Early death Brother 80       Epilepsy     Current Outpatient Medications (Cardiovascular):  .  atorvastatin (LIPITOR) 40 MG tablet, Take 1 tablet (40 mg total) by mouth daily. Marland Kitchen  EPINEPHrine (EPIPEN IJ), Inject as directed as directed. Marland Kitchen  lisinopril (ZESTRIL) 20 MG tablet, Take 1 tablet (20 mg total) by mouth daily. .  metoprolol tartrate (LOPRESSOR) 25 MG tablet, Take 0.5 tablets (12.5 mg total) by mouth 2 (two) times daily. .  sildenafil (VIAGRA) 100 MG tablet, TAKE 1  TABLET BY MOUTH ONCE DAILY AS NEEDED FOR ERECTILE DYSFUNCTION  Current Outpatient Medications (Respiratory):  .  albuterol (PROVENTIL HFA;VENTOLIN HFA) 108 (90 Base) MCG/ACT inhaler, Inhale 1-2 puffs into the lungs every 6 (six) hours as needed for wheezing or shortness of breath. .  Cetirizine HCl (ZYRTEC PO), Take 10 mg by mouth daily.  Current Outpatient Medications (Analgesics):  .  aspirin EC 81 MG tablet, Take 81 mg by mouth daily. .  meloxicam (MOBIC) 15 MG tablet, Take 1 tablet (15 mg total) by mouth daily.  Current Outpatient Medications (Hematological):  .  ferrous sulfate 325 (65 FE) MG tablet, Take 325 mg by mouth daily with breakfast. .  folic acid (FOLVITE) 1 MG tablet, Take 1 tablet (1 mg total) by mouth daily.  Current Outpatient Medications (Other):  Marland Kitchen.  Ascorbic Acid (VITAMIN C) 100 MG tablet,  Take 100 mg by mouth daily. .  cholecalciferol (VITAMIN D) 400 units TABS tablet, Take 400 Units by mouth daily. .  citalopram (CELEXA) 10 MG tablet, Take 1 tablet (10 mg total) by mouth daily. .  clobetasol cream (TEMOVATE) 0.05 %, Apply 1 application topically 2 (two) times daily. (Patient taking differently: Apply 1 application topically 2 (two) times daily as needed (rash). ) .  diclofenac sodium (VOLTAREN) 1 % GEL, Apply 2 g topically 4 (four) times daily as needed (pain).     Past medical history, social, surgical and family history all reviewed in electronic medical record.  No pertanent information unless stated regarding to the chief complaint.   Review of Systems:  No headache, visual changes, nausea, vomiting, diarrhea, constipation, dizziness, abdominal pain, skin rash, fevers, chills, night sweats, weight loss, swollen lymph nodes, body aches, joint swelling, muscle aches, chest pain, shortness of breath, mood changes.   Objective  Blood pressure 110/80, pulse (!) 48, weight 139 lb (63 kg), SpO2 96 %.   General: No apparent distress alert and oriented x3 mood and affect normal, dressed appropriately.  HEENT: Pupils equal, extraocular movements intact  Respiratory: Patient's speak in full sentences and does not appear short of breath  Cardiovascular: No lower extremity edema, non tender, no erythema  Skin: Warm dry intact with no signs of infection or rash on extremities or on axial skeleton.  Abdomen: Soft nontender  Neuro: Cranial nerves II through XII are intact, neurovascularly intact in all extremities with 2+ DTRs and 2+ pulses.  Lymph: No lymphadenopathy of posterior or anterior cervical chain or axillae bilaterally.  Gait normal with good balance and coordination.  MSK:  Non tender with full range of motion and good stability and symmetric strength and tone of , elbows, wrist, hip, knee and ankles bilaterally.  Bilateral shoulder girdles bilaterally show that patient  does have significant atrophy noted of the shoulders.  Decreased range of motion in all planes of 5 to 10 degrees at least with significant crepitus right greater than left.  Rotator cuff strength 3 out of 5 on the right and 4 out of 5 on the left  [id number]; 15 additional minutes spent for Therapeutic exercises as stated in above notes.  This included exercises focusing on stretching, strengthening, with significant focus on eccentric aspects.   Long term goals include an improvement in range of motion, strength, endurance as well as avoiding reinjury. Patient's frequency would include in 1-2 times a day, 3-5 times a week for a duration of 6-12 weeks. Shoulder Exercises that included:  Basic scapular stabilization to include adduction and depression of scapula Scaption, focusing  on proper movement and good control Internal and External rotation utilizing a theraband, with elbow tucked at side entire time Rows with theraband  Which was given    Proper technique shown and discussed handout in great detail with ATC.  All questions were discussed and answered.     Impression and Recommendations:     This case required medical decision making of moderate complexity. The above documentation has been reviewed and is accurate and complete Lyndal Pulley, DO       Note: This dictation was prepared with Dragon dictation along with smaller phrase technology. Any transcriptional errors that result from this process are unintentional.

## 2019-03-19 NOTE — Patient Instructions (Signed)
  [address]st [address] [place], [place] [address] Phone [phone]  Good to see you.  Ice 20 minutes 2 times daily. Usually after activity and before bed. pennsaid pinkie amount topically 2 times daily as needed.  Turmeric 500mg  daily  Tart cherry extract 1200mg  at night Vitamin D 2000 IU daily  See me again in 6 weeks

## 2019-03-19 NOTE — Assessment & Plan Note (Signed)
Moderate to severe overall.  Rotator cuff arthropathy.  Discussed icing regimen and home exercise, topical anti-inflammatories.  Discussed over-the-counter medications that could be beneficial and warned of potential side effects.  If having worsening symptoms I would consider the possibility of intra-articular injections as well as possibly formal physical therapy depending on the coronavirus outbreak.

## 2019-04-30 ENCOUNTER — Encounter: Payer: Self-pay | Admitting: Family Medicine

## 2019-04-30 ENCOUNTER — Other Ambulatory Visit: Payer: Self-pay

## 2019-04-30 ENCOUNTER — Ambulatory Visit (INDEPENDENT_AMBULATORY_CARE_PROVIDER_SITE_OTHER): Payer: Medicare Other | Admitting: Family Medicine

## 2019-04-30 DIAGNOSIS — M25511 Pain in right shoulder: Secondary | ICD-10-CM | POA: Diagnosis not present

## 2019-04-30 DIAGNOSIS — M25512 Pain in left shoulder: Secondary | ICD-10-CM | POA: Diagnosis not present

## 2019-04-30 DIAGNOSIS — G8929 Other chronic pain: Secondary | ICD-10-CM | POA: Diagnosis not present

## 2019-04-30 NOTE — Progress Notes (Signed)
[facility] [address] [place] Phone: [phone] Subjective:   I Bruce [facility] am serving as a Education administrator for Dr. Hulan Saas.  This visit occurred during the SARS-CoV-2 public health emergency.  Safety protocols were in place, including screening questions prior to the visit, additional usage of staff PPE, and extensive cleaning of exam room while observing appropriate contact time as indicated for disinfecting solutions.   I'm seeing this patient by the request  of:  Janith Lima, MD  CC: Bilateral shoulder pain  [id number]   03/19/2019 Moderate to severe overall.  Rotator cuff arthropathy.  Discussed icing regimen and home exercise, topical anti-inflammatories.  Discussed over-the-counter medications that could be beneficial and warned of potential side effects.  If having worsening symptoms I would consider the possibility of intra-articular injections as well as possibly formal physical therapy depending on the coronavirus outbreak.  04/30/2019 Bruce Jordan. is a 74 y.o. male coming in with complaint of bilateral shoulder pain. Patient states he is doing well. Believes he is good shape for his age. Hasn't been doing much due to the holidays. Using topical ointment 2 times a day. Hasn't been doing exercises but has noticed that he is still improving. Patient recently moved out of his trailer into an apartment. Believes the work required to take care of the trailer is too much for his age and overall has improved his shoulder pain.       Past Medical History:  Diagnosis Date  . Chronic ischemic heart disease   . Coronary artery disease   . Hyperlipidemia   . Hypertension   . IFG (impaired fasting glucose)   . Osteoarthritis    Past Surgical History:  Procedure Laterality Date  . CARDIAC CATHETERIZATION     Social History   Socioeconomic History  . Marital status: Married    Spouse name: Not on file  . Number of  children: Not on file  . Years of education: Not on file  . Highest education level: Not on file  Occupational History  . Not on file  Tobacco Use  . Smoking status: Former Research scientist (life sciences)  . Smokeless tobacco: Never Used  Substance and Sexual Activity  . Alcohol use: No  . Drug use: No  . Sexual activity: Yes    Birth control/protection: None  Other Topics Concern  . Not on file  Social History Narrative  . Not on file   Social Determinants of Health   Financial Resource Strain:   . Difficulty of Paying Living Expenses: Not on file  Food Insecurity:   . Worried About Charity fundraiser in the Last Year: Not on file  . Ran Out of Food in the Last Year: Not on file  Transportation Needs:   . Lack of Transportation (Medical): Not on file  . Lack of Transportation (Non-Medical): Not on file  Physical Activity:   . Days of Exercise per Week: Not on file  . Minutes of Exercise per Session: Not on file  Stress:   . Feeling of Stress : Not on file  Social Connections:   . Frequency of Communication with Friends and Family: Not on file  . Frequency of Social Gatherings with Friends and Family: Not on file  . Attends Religious Services: Not on file  . Active Member of Clubs or Organizations: Not on file  . Attends Archivist Meetings: Not on file  . Marital Status: Not on file  No Known Allergies Family History  Problem Relation Age of Onset  . Hypertension Mother   . Heart disease Mother   . Heart disease Father   . Cancer Father   . Early death Brother 30       MVA  . Stroke Brother   . Early death Brother 75       Epilepsy     Current Outpatient Medications (Cardiovascular):  .  atorvastatin (LIPITOR) 40 MG tablet, Take 1 tablet (40 mg total) by mouth daily. Marland Kitchen  EPINEPHrine (EPIPEN IJ), Inject as directed as directed. Marland Kitchen  lisinopril (ZESTRIL) 20 MG tablet, Take 1 tablet (20 mg total) by mouth daily. .  metoprolol tartrate (LOPRESSOR) 25 MG tablet, Take 0.5  tablets (12.5 mg total) by mouth 2 (two) times daily. .  sildenafil (VIAGRA) 100 MG tablet, TAKE 1 TABLET BY MOUTH ONCE DAILY AS NEEDED FOR ERECTILE DYSFUNCTION  Current Outpatient Medications (Respiratory):  .  albuterol (PROVENTIL HFA;VENTOLIN HFA) 108 (90 Base) MCG/ACT inhaler, Inhale 1-2 puffs into the lungs every 6 (six) hours as needed for wheezing or shortness of breath. .  Cetirizine HCl (ZYRTEC PO), Take 10 mg by mouth daily.  Current Outpatient Medications (Analgesics):  .  aspirin EC 81 MG tablet, Take 81 mg by mouth daily. .  meloxicam (MOBIC) 15 MG tablet, Take 1 tablet (15 mg total) by mouth daily.  Current Outpatient Medications (Hematological):  .  ferrous sulfate 325 (65 FE) MG tablet, Take 325 mg by mouth daily with breakfast. .  folic acid (FOLVITE) 1 MG tablet, Take 1 tablet (1 mg total) by mouth daily.  Current Outpatient Medications (Other):  Marland Kitchen  Ascorbic Acid (VITAMIN C) 100 MG tablet, Take 100 mg by mouth daily. .  cholecalciferol (VITAMIN D) 400 units TABS tablet, Take 400 Units by mouth daily. .  citalopram (CELEXA) 10 MG tablet, Take 1 tablet (10 mg total) by mouth daily. .  clobetasol cream (TEMOVATE) 0.05 %, Apply 1 application topically 2 (two) times daily. (Patient taking differently: Apply 1 application topically 2 (two) times daily as needed (rash). ) .  diclofenac sodium (VOLTAREN) 1 % GEL, Apply 2 g topically 4 (four) times daily as needed (pain).     Past medical history, social, surgical and family history all reviewed in electronic medical record.  No pertanent information unless stated regarding to the chief complaint.   Review of Systems:  No headache, visual changes, nausea, vomiting, diarrhea, constipation, dizziness, abdominal pain, skin rash, fevers, chills, night sweats, weight loss, swollen lymph nodes, body aches, joint swelling, chest pain, shortness of breath, mood changes. POSITIVE muscle aches  Objective  Blood pressure 130/90, pulse 67,  height 5\' 5"  (1.651 m), weight 139 lb (63 kg), SpO2 98 %.   General: No apparent distress alert and oriented x3 mood and affect normal, dressed appropriately.  HEENT: Pupils equal, extraocular movements intact  Respiratory: Patient's speak in full sentences and does not appear short of breath  Cardiovascular: No lower extremity edema, non tender, no erythema  Skin: Warm dry intact with no signs of infection or rash on extremities or on axial skeleton.  Abdomen: Soft nontender  Neuro: Cranial nerves II through XII are intact, neurovascularly intact in all extremities with 2+ DTRs and 2+ pulses.  Lymph: No lymphadenopathy of posterior or anterior cervical chain or axillae bilaterally.  Gait normal with good balance and coordination.  MSK: Mild arthritic changes of multiple joints. Shoulder exam bilaterally does show the patient does have significant atrophy  of the musculature.  Patient does have significant crepitus left greater than right of the shoulder.  Rotator cuff strength of 3 out of 5 worse on the left than the right as well.    Impression and Recommendations:     . The above documentation has been reviewed and is accurate and complete Judi Saa, DO       Note: This dictation was prepared with Dragon dictation along with smaller phrase technology. Any transcriptional errors that result from this process are unintentional.

## 2019-04-30 NOTE — Patient Instructions (Signed)
Good to see you Doing great Pennsaid 2 times a day. Small amount on most painful spot Exercise more regularly See me again in 3 months

## 2019-04-30 NOTE — Assessment & Plan Note (Signed)
Severe rotator cuff arthropathy of the shoulders bilaterally.  We discussed with patient in great length.  Patient is going to continue with conservative therapy.  We can do injections if any worsening pain.  Trial of topical anti-inflammatories given, discussed which activities to do which wants to avoid.  Patient will increase activity slowly.  Follow-up with me again in 3 months

## 2019-05-13 ENCOUNTER — Encounter: Payer: Self-pay | Admitting: Internal Medicine

## 2019-05-13 MED ORDER — ATORVASTATIN CALCIUM 40 MG PO TABS: 40.0000 mg | ORAL_TABLET | Freq: Every day | ORAL | 1 refills | Status: DC

## 2019-05-15 ENCOUNTER — Ambulatory Visit (INDEPENDENT_AMBULATORY_CARE_PROVIDER_SITE_OTHER): Payer: Medicare Other | Admitting: Internal Medicine

## 2019-05-15 ENCOUNTER — Other Ambulatory Visit: Payer: Self-pay

## 2019-05-15 ENCOUNTER — Encounter: Payer: Self-pay | Admitting: Internal Medicine

## 2019-05-15 VITALS — BP 134/64 | HR 72 | Temp 98.0°F | Ht 65.0 in | Wt 140.0 lb

## 2019-05-15 DIAGNOSIS — N5201 Erectile dysfunction due to arterial insufficiency: Secondary | ICD-10-CM

## 2019-05-15 DIAGNOSIS — M12812 Other specific arthropathies, not elsewhere classified, left shoulder: Secondary | ICD-10-CM

## 2019-05-15 DIAGNOSIS — I10 Essential (primary) hypertension: Secondary | ICD-10-CM

## 2019-05-15 DIAGNOSIS — D693 Immune thrombocytopenic purpura: Secondary | ICD-10-CM

## 2019-05-15 DIAGNOSIS — E538 Deficiency of other specified B group vitamins: Secondary | ICD-10-CM | POA: Insufficient documentation

## 2019-05-15 DIAGNOSIS — D520 Dietary folate deficiency anemia: Secondary | ICD-10-CM

## 2019-05-15 DIAGNOSIS — N1832 Chronic kidney disease, stage 3b: Secondary | ICD-10-CM

## 2019-05-15 DIAGNOSIS — M12811 Other specific arthropathies, not elsewhere classified, right shoulder: Secondary | ICD-10-CM | POA: Diagnosis not present

## 2019-05-15 DIAGNOSIS — D513 Other dietary vitamin B12 deficiency anemia: Secondary | ICD-10-CM

## 2019-05-15 LAB — CBC WITH DIFFERENTIAL/PLATELET
Basophils Absolute: 0 10*3/uL (ref 0.0–0.1)
Basophils Relative: 0.3 % (ref 0.0–3.0)
Eosinophils Absolute: 0.3 10*3/uL (ref 0.0–0.7)
Eosinophils Relative: 4 % (ref 0.0–5.0)
HCT: 40.8 % (ref 39.0–52.0)
Hemoglobin: 13.8 g/dL (ref 13.0–17.0)
Lymphocytes Relative: 7.5 % — ABNORMAL LOW (ref 12.0–46.0)
Lymphs Abs: 0.5 10*3/uL — ABNORMAL LOW (ref 0.7–4.0)
MCHC: 33.8 g/dL (ref 30.0–36.0)
MCV: 93.6 fl (ref 78.0–100.0)
Monocytes Absolute: 0.5 10*3/uL (ref 0.1–1.0)
Monocytes Relative: 7.7 % (ref 3.0–12.0)
Neutro Abs: 5.6 10*3/uL (ref 1.4–7.7)
Neutrophils Relative %: 80.5 % — ABNORMAL HIGH (ref 43.0–77.0)
Platelets: 122 10*3/uL — ABNORMAL LOW (ref 150.0–400.0)
RBC: 4.36 Mil/uL (ref 4.22–5.81)
RDW: 13.5 % (ref 11.5–15.5)
WBC: 6.9 10*3/uL (ref 4.0–10.5)

## 2019-05-15 LAB — BASIC METABOLIC PANEL
BUN: 17 mg/dL (ref 6–23)
CO2: 27 mEq/L (ref 19–32)
Calcium: 9.3 mg/dL (ref 8.4–10.5)
Chloride: 108 mEq/L (ref 96–112)
Creatinine, Ser: 1.41 mg/dL (ref 0.40–1.50)
GFR: 48.91 mL/min — ABNORMAL LOW (ref >60.00)
Glucose, Bld: 101 mg/dL — ABNORMAL HIGH (ref 70–99)
Potassium: 4.2 mEq/L (ref 3.5–5.1)
Sodium: 142 mEq/L (ref 135–145)

## 2019-05-15 LAB — FOLATE: Folate: >24.1 ng/mL (ref >5.9)

## 2019-05-15 MED ORDER — SILDENAFIL CITRATE 100 MG PO TABS: ORAL_TABLET | ORAL | 5 refills | Status: DC

## 2019-05-15 NOTE — Patient Instructions (Signed)
Vitamin B12 and Folate Test  Why am I having this test? Vitamin B12 and folate (folic acid) are both B vitamins that are needed to make red blood cells and to keep your nervous system healthy. Vitamin B12 is found in foods such as meats, eggs, dairy products, and fish. Folate is found in fruits, beans, and leafy green vegetables. These vitamins also get added to some foods, such as grains and cereals. You may have a lack (deficiency) of these B vitamins in your body if you do not get enough of them in your diet. Low levels can also be caused by digestive system diseases that interfere with your ability to absorb the vitamins from your food. The most common cause of a vitamin B12 deficiency is the inability to absorb it (pernicious anemia). You may have a vitamin B12 and folate test if:  You have symptoms of vitamin B12 or folate deficiency, such as fatigue, headache, confusion, poor balance, or tingling and numbness.  You are pregnant or breastfeeding. Women who are pregnant or breastfeeding need more folate and may need to take supplements.  Your red blood cell count is low (anemia).  You are an older person and have mental confusion.  You have a disease or condition that may lead to a deficiency of these B vitamins. What is being tested? This test measures the amount of vitamin B12 and folate in your blood. The tests for vitamin B12 and folate may be done together or separately. What kind of sample is taken?  A blood sample is required for this test. It is usually collected by inserting a needle into a blood vessel. How do I prepare for this test? Follow instructions from your health care provider about eating and drinking before the test. Tell a health care provider about:  All medicines you are taking, including vitamins, herbs, eye drops, creams, and over-the-counter medicines.  Any medical conditions you have.  Whether you are pregnant or may be pregnant.  How often you drink  alcohol. How are the results reported? Your test results will be reported as values that identify the amount of vitamin B12 and folate in your blood. Your health care provider will compare your results to normal ranges that were established after testing a large group of people (reference ranges). Reference ranges may vary among labs and hospitals. For this test, common reference ranges are:  Vitamin B12: 160-950 pg/mL or 118-701 pmol/L (SI units).  Folate: 5-25 ng/mL or 11-57 nmol/L (SI units). What do the results mean? Results within the reference range are considered normal. Vitamin B12 or folate levels that are lower than the reference range may be caused by various conditions, including:  Anemia.  Poor nutrition.  Alcoholism.  Liver disease.  Digestive disease. High levels of vitamin B12 are rare, but they may happen if you have:  Cancer.  Diabetes.  Heart failure.  Obesity.  Liver disease.  Human immunodeficiency virus (HIV). High levels of folate may happen if:  You have pernicious anemia.  You are vegetarian.  You have had a recent blood transfusion. Talk with your health care provider about what your results mean. Questions to ask your health care provider Ask your health care provider, or the department that is doing the test:  When will my results be ready?  How will I get my results?  What are my treatment options?  What other tests do I need?  What are my next steps? Summary  Vitamin B12 and folate (folic acid)   are both B vitamins that are needed to make red blood cells and to keep your nervous system healthy.  You may have a lack (deficiency) of these B vitamins in your body if you do not get enough of them in your diet or if you have a digestive system disease.  This test measures the amount of vitamin B12 and folate in your blood. A blood sample is required for the test.  Talk with your health care provider about what your results mean.  This information is not intended to replace advice given to you by your health care provider. Make sure you discuss any questions you have with your health care provider. Document Revised: 10/26/2018 Document Reviewed: 11/21/2016 Elsevier Patient Education  2020 [employer]  

## 2019-05-15 NOTE — Progress Notes (Signed)
Subjective:  Patient ID: Bruce Jordan., male    DOB: 1945-04-20  Age: 74 y.o. MRN: [id number]  CC: Hypertension   This visit occurred during the SARS-CoV-2 public health emergency.  Safety protocols were in place, including screening questions prior to the visit, additional usage of staff PPE, and extensive cleaning of exam room while observing appropriate contact time as indicated for disinfecting solutions.   HPI [name]. presents for f/up - He continues to complain of shoulder pain.  He has seen sports medicine and is applying a topical NSAID and says the shoulders are getting better.  His range of motion has increased with physical therapy.  He otherwise feels well today and offers no other complaints.  Outpatient Medications Prior to Visit  Medication Sig Dispense Refill  . albuterol (PROVENTIL HFA;VENTOLIN HFA) 108 (90 Base) MCG/ACT inhaler Inhale 1-2 puffs into the lungs every 6 (six) hours as needed for wheezing or shortness of breath. 18 g 3  . Ascorbic Acid (VITAMIN C) 100 MG tablet Take 100 mg by mouth daily.    Marland Kitchen aspirin EC 81 MG tablet Take 81 mg by mouth daily.    Marland Kitchen atorvastatin (LIPITOR) 40 MG tablet Take 1 tablet (40 mg total) by mouth daily. 90 tablet 1  . Cetirizine HCl (ZYRTEC PO) Take 10 mg by mouth daily.    . cholecalciferol (VITAMIN D) 400 units TABS tablet Take 400 Units by mouth daily.    . citalopram (CELEXA) 10 MG tablet Take 1 tablet (10 mg total) by mouth daily. 90 tablet 1  . clobetasol cream (TEMOVATE) 0.05 % Apply 1 application topically 2 (two) times daily. (Patient taking differently: Apply 1 application topically 2 (two) times daily as needed (rash). ) 60 g 3  . diclofenac sodium (VOLTAREN) 1 % GEL Apply 2 g topically 4 (four) times daily as needed (pain).     Marland Kitchen EPINEPHrine (EPIPEN IJ) Inject as directed as directed.    . ferrous sulfate 325 (65 FE) MG tablet Take 325 mg by mouth daily with breakfast.    . lisinopril (ZESTRIL) 20 MG tablet  Take 1 tablet (20 mg total) by mouth daily. 90 tablet 1  . metoprolol tartrate (LOPRESSOR) 25 MG tablet Take 0.5 tablets (12.5 mg total) by mouth 2 (two) times daily. 90 tablet 1  . Misc Natural Products (TART CHERRY ADVANCED PO) Take by mouth.    . TURMERIC PO Take by mouth.    . folic acid (FOLVITE) 1 MG tablet Take 1 tablet (1 mg total) by mouth daily. 90 tablet 1  . meloxicam (MOBIC) 15 MG tablet Take 1 tablet (15 mg total) by mouth daily. 90 tablet 1  . sildenafil (VIAGRA) 100 MG tablet TAKE 1 TABLET BY MOUTH ONCE DAILY AS NEEDED FOR ERECTILE DYSFUNCTION 10 tablet 5   No facility-administered medications prior to visit.    ROS Review of Systems  Constitutional: Negative for diaphoresis and fatigue.  HENT: Negative.  Negative for nosebleeds.   Eyes: Negative.   Respiratory: Negative for cough, chest tightness and shortness of breath.   Cardiovascular: Negative for chest pain, palpitations and leg swelling.  Gastrointestinal: Negative for abdominal pain, constipation, diarrhea, nausea and vomiting.  Endocrine: Negative.   Genitourinary: Negative for difficulty urinating and hematuria.       +ED  Musculoskeletal: Positive for arthralgias. Negative for myalgias and neck pain.  Skin: Negative.  Negative for color change and pallor.  Neurological: Negative.  Negative for dizziness, weakness, light-headedness  and headaches.  Hematological: Negative for adenopathy. Does not bruise/bleed easily.  Psychiatric/Behavioral: Negative.     Objective:  BP 134/64 (BP Location: Left Arm, Patient Position: Sitting, Cuff Size: Normal)   Pulse 72   Temp 98 F (36.7 C) (Oral)   Ht 5\' 5"  (1.651 m)   Wt 140 lb (63.5 kg)   SpO2 97%   BMI 23.30 kg/m   BP Readings from Last 3 Encounters:  05/15/19 134/64  04/30/19 130/90  03/19/19 110/80    Wt Readings from Last 3 Encounters:  05/15/19 140 lb (63.5 kg)  04/30/19 139 lb (63 kg)  03/19/19 139 lb (63 kg)    Physical Exam Constitutional:        Appearance: Normal appearance.  HENT:     Nose: Nose normal.     Mouth/Throat:     Mouth: Mucous membranes are moist.  Eyes:     General: No scleral icterus.    Conjunctiva/sclera: Conjunctivae normal.  Cardiovascular:     Rate and Rhythm: Normal rate and regular rhythm.     Heart sounds: No murmur.  Pulmonary:     Effort: Pulmonary effort is normal.     Breath sounds: No stridor. No wheezing, rhonchi or rales.  Abdominal:     General: Abdomen is flat. Bowel sounds are normal. There is no distension.     Palpations: Abdomen is soft. There is no hepatomegaly or splenomegaly.     Tenderness: There is no abdominal tenderness.  Musculoskeletal:        General: Normal range of motion.     Cervical back: Neck supple.     Right lower leg: No edema.     Left lower leg: No edema.  Lymphadenopathy:     Cervical: No cervical adenopathy.  Skin:    General: Skin is warm and dry.  Neurological:     General: No focal deficit present.     Mental Status: He is alert.  Psychiatric:        Mood and Affect: Mood normal.        Behavior: Behavior normal.     Lab Results  Component Value Date   WBC 6.9 05/15/2019   HGB 13.8 05/15/2019   HCT 40.8 05/15/2019   PLT 122.0 (L) 05/15/2019   GLUCOSE 101 (H) 05/15/2019   CHOL 123 12/04/2018   TRIG 179.0 (H) 12/04/2018   HDL 44.20 12/04/2018   LDLDIRECT 77.0 10/04/2017   LDLCALC 43 12/04/2018   ALT 12 12/04/2018   AST 18 12/04/2018   NA 142 05/15/2019   K 4.2 05/15/2019   CL 108 05/15/2019   CREATININE 1.41 05/15/2019   BUN 17 05/15/2019   CO2 27 05/15/2019   TSH 2.42 12/04/2018   PSA 3.49 12/04/2018   HGBA1C 5.7 12/04/2018    DG Chest 2 View  Result Date: 09/12/2017 CLINICAL DATA:  Cough, congestion, tightness on chest x 5 wks, HTN, hx of cardiac cath. EXAM: CHEST - 2 VIEW COMPARISON:  None. FINDINGS: Heart size is upper normal. 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 Lungs are clear. No pleural effusion or pneumothorax  seen. 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 No acute or suspicious osseous finding. IMPRESSION: No active cardiopulmonary disease. No evidence of pneumonia or pulmonary edema. Electronically Signed   By: Franki Cabot M.D.   On: 09/12/2017 10:16    Assessment & Plan:   Teagen was seen today for hypertension.  Diagnoses and all orders for  this visit:  Rotator cuff arthropathy of both shoulders- He is not interested in surgical repair.  He does not want to take anything other than NSAIDs for the pain.  Other dietary vitamin B12 deficiency anemia- His B12 level is normal now. -     CBC with Differential/Platelet -     Folate  Folate deficiency- His folate level is too high.  I have asked him to decrease his folate dosage to every other day. -     Folate  Essential hypertension- His blood pressure is well controlled.  Electrolytes are normal.  Renal function is stable. -     Basic metabolic panel  Erectile dysfunction due to arterial insufficiency -     sildenafil (VIAGRA) 100 MG tablet; TAKE 1 TABLET BY MOUTH ONCE DAILY AS NEEDED FOR ERECTILE DYSFUNCTION  Dietary folate deficiency anemia -     folic acid (FOLVITE) 1 MG tablet; Take 1 tablet (1 mg total) by mouth every other day.  Stage 3b chronic kidney disease- I have asked him to avoid systemic nsaids and other nephrotoxic agents.  Will continue the current dose of the ACE inhibitor.  Chronic ITP (idiopathic thrombocytopenia) (HCC)- His platelet count remains mildly low despite normalization of the B12 and folate levels.  He is not anemic and his other cell lines are unremarkable.  He does not have any bleeding complications.  This is likely ITP.  I will continue to monitor this in the future.   I have discontinued Greig Castilla [name] "Andy"'s meloxicam. I have also changed his folic acid. Additionally, I am having him maintain his aspirin EC, cholecalciferol, diclofenac sodium,  ferrous sulfate, vitamin C, clobetasol cream, albuterol, EPINEPHrine (EPIPEN IJ), Cetirizine HCl (ZYRTEC PO), metoprolol tartrate, lisinopril, citalopram, atorvastatin, Misc Natural Products (TART CHERRY ADVANCED PO), TURMERIC PO, and sildenafil.  Meds ordered this encounter  Medications  . sildenafil (VIAGRA) 100 MG tablet    Sig: TAKE 1 TABLET BY MOUTH ONCE DAILY AS NEEDED FOR ERECTILE DYSFUNCTION    Dispense:  10 tablet    Refill:  5    Please consider 90 day supplies to promote better adherence  . folic acid (FOLVITE) 1 MG tablet    Sig: Take 1 tablet (1 mg total) by mouth every other day.    Dispense:  45 tablet    Refill:  1     Follow-up: Return in about 6 months (around 11/12/2019).  Sanda Linger, MD

## 2019-05-16 ENCOUNTER — Encounter: Payer: Self-pay | Admitting: Internal Medicine

## 2019-05-16 DIAGNOSIS — D693 Immune thrombocytopenic purpura: Secondary | ICD-10-CM | POA: Insufficient documentation

## 2019-05-16 DIAGNOSIS — D520 Dietary folate deficiency anemia: Secondary | ICD-10-CM

## 2019-05-16 DIAGNOSIS — N1832 Chronic kidney disease, stage 3b: Secondary | ICD-10-CM

## 2019-05-16 HISTORY — DX: Chronic kidney disease, stage 3b: N18.32

## 2019-05-16 HISTORY — DX: Dietary folate deficiency anemia: D52.0

## 2019-05-16 HISTORY — DX: Immune thrombocytopenic purpura: D69.3

## 2019-05-16 MED ORDER — FOLIC ACID 1 MG PO TABS: 1.0000 mg | ORAL_TABLET | ORAL | 1 refills | Status: DC

## 2019-05-27 ENCOUNTER — Encounter: Payer: Self-pay | Admitting: Internal Medicine

## 2019-05-27 DIAGNOSIS — I10 Essential (primary) hypertension: Secondary | ICD-10-CM

## 2019-05-27 DIAGNOSIS — I2510 Atherosclerotic heart disease of native coronary artery without angina pectoris: Secondary | ICD-10-CM

## 2019-05-28 MED ORDER — METOPROLOL TARTRATE 25 MG PO TABS: 12.5000 mg | ORAL_TABLET | Freq: Two times a day (BID) | ORAL | 1 refills | Status: DC

## 2019-06-08 ENCOUNTER — Encounter: Payer: Self-pay | Admitting: Internal Medicine

## 2019-06-08 DIAGNOSIS — I2510 Atherosclerotic heart disease of native coronary artery without angina pectoris: Secondary | ICD-10-CM

## 2019-06-08 DIAGNOSIS — I10 Essential (primary) hypertension: Secondary | ICD-10-CM

## 2019-06-08 MED ORDER — LISINOPRIL 20 MG PO TABS: 20.0000 mg | ORAL_TABLET | Freq: Every day | ORAL | 1 refills | Status: DC

## 2019-06-10 ENCOUNTER — Encounter: Payer: Self-pay | Admitting: Internal Medicine

## 2019-07-12 ENCOUNTER — Encounter: Payer: Self-pay | Admitting: Internal Medicine

## 2019-07-12 DIAGNOSIS — F418 Other specified anxiety disorders: Secondary | ICD-10-CM

## 2019-07-15 MED ORDER — CITALOPRAM HYDROBROMIDE 10 MG PO TABS: 10.0000 mg | ORAL_TABLET | Freq: Every day | ORAL | 1 refills | Status: DC

## 2019-07-16 ENCOUNTER — Ambulatory Visit (INDEPENDENT_AMBULATORY_CARE_PROVIDER_SITE_OTHER): Payer: Medicare Other | Admitting: Internal Medicine

## 2019-07-16 ENCOUNTER — Encounter: Payer: Self-pay | Admitting: Internal Medicine

## 2019-07-16 ENCOUNTER — Other Ambulatory Visit: Payer: Self-pay

## 2019-07-16 VITALS — BP 130/80 | HR 70 | Temp 98.1°F | Ht 65.0 in | Wt 146.0 lb

## 2019-07-16 DIAGNOSIS — L03011 Cellulitis of right finger: Secondary | ICD-10-CM | POA: Diagnosis not present

## 2019-07-16 DIAGNOSIS — I2510 Atherosclerotic heart disease of native coronary artery without angina pectoris: Secondary | ICD-10-CM

## 2019-07-16 MED ORDER — SULFAMETHOXAZOLE-TRIMETHOPRIM 800-160 MG PO TABS: 1.0000 | ORAL_TABLET | Freq: Two times a day (BID) | ORAL | 0 refills | Status: AC

## 2019-07-16 NOTE — Progress Notes (Signed)
Subjective:  Patient ID: Bruce Jordan., male    DOB: 24-Apr-1945  Age: 74 y.o. MRN: [id number]  CC: Hand Pain  This visit occurred during the SARS-CoV-2 public health emergency.  Safety protocols were in place, including screening questions prior to the visit, additional usage of staff PPE, and extensive cleaning of exam room while observing appropriate contact time as indicated for disinfecting solutions.    HPI Bruce Jordan. presents for concerns about his RMF - he has had a 3 week hx of pain, redness, drainage, swelling, and peeling.  Outpatient Medications Prior to Visit  Medication Sig Dispense Refill  . albuterol (PROVENTIL HFA;VENTOLIN HFA) 108 (90 Base) MCG/ACT inhaler Inhale 1-2 puffs into the lungs every 6 (six) hours as needed for wheezing or shortness of breath. 18 g 3  . Ascorbic Acid (VITAMIN C) 100 MG tablet Take 100 mg by mouth daily.    Marland Kitchen aspirin EC 81 MG tablet Take 81 mg by mouth daily.    Marland Kitchen atorvastatin (LIPITOR) 40 MG tablet Take 1 tablet (40 mg total) by mouth daily. 90 tablet 1  . Cetirizine HCl (ZYRTEC PO) Take 10 mg by mouth daily.    . cholecalciferol (VITAMIN D) 400 units TABS tablet Take 400 Units by mouth daily.    . citalopram (CELEXA) 10 MG tablet Take 1 tablet (10 mg total) by mouth daily. 90 tablet 1  . clobetasol cream (TEMOVATE) 0.05 % Apply 1 application topically 2 (two) times daily. (Patient taking differently: Apply 1 application topically 2 (two) times daily as needed (rash). ) 60 g 3  . diclofenac sodium (VOLTAREN) 1 % GEL Apply 2 g topically 4 (four) times daily as needed (pain).     Marland Kitchen EPINEPHrine (EPIPEN IJ) Inject as directed as directed.    . ferrous sulfate 325 (65 FE) MG tablet Take 325 mg by mouth daily with breakfast.    . folic acid (FOLVITE) 1 MG tablet Take 1 tablet (1 mg total) by mouth every other day. 45 tablet 1  . lisinopril (ZESTRIL) 20 MG tablet Take 1 tablet (20 mg total) by mouth daily. 90 tablet 1  . metoprolol  tartrate (LOPRESSOR) 25 MG tablet Take 0.5 tablets (12.5 mg total) by mouth 2 (two) times daily. 90 tablet 1  . Misc Natural Products (TART CHERRY ADVANCED PO) Take by mouth.    . sildenafil (VIAGRA) 100 MG tablet TAKE 1 TABLET BY MOUTH ONCE DAILY AS NEEDED FOR ERECTILE DYSFUNCTION 10 tablet 5  . TURMERIC PO Take by mouth.     No facility-administered medications prior to visit.    ROS Review of Systems  Constitutional: Negative for chills, fatigue and fever.  HENT: Negative.   Eyes: Negative.   Gastrointestinal: Negative for abdominal pain.  Genitourinary: Negative.   Musculoskeletal: Negative for myalgias.  Skin: Positive for color change. Negative for wound.  Neurological: Negative.   Hematological: Negative for adenopathy. Does not bruise/bleed easily.  Psychiatric/Behavioral: Negative.     Objective:  BP 130/80 (BP Location: Left Arm, Patient Position: Sitting, Cuff Size: Large)   Pulse 70   Temp 98.1 F (36.7 C) (Oral)   Ht 5\' 5"  (1.651 m)   Wt 146 lb (66.2 kg)   SpO2 97%   BMI 24.30 kg/m   BP Readings from Last 3 Encounters:  07/16/19 130/80  05/15/19 134/64  04/30/19 130/90    Wt Readings from Last 3 Encounters:  07/16/19 146 lb (66.2 kg)  05/15/19 140 lb (  63.5 kg)  04/30/19 139 lb (63 kg)    Physical Exam Musculoskeletal:       Hands:     Lab Results  Component Value Date   WBC 6.9 05/15/2019   HGB 13.8 05/15/2019   HCT 40.8 05/15/2019   PLT 122.0 (L) 05/15/2019   GLUCOSE 101 (H) 05/15/2019   CHOL 123 12/04/2018   TRIG 179.0 (H) 12/04/2018   HDL 44.20 12/04/2018   LDLDIRECT 77.0 10/04/2017   LDLCALC 43 12/04/2018   ALT 12 12/04/2018   AST 18 12/04/2018   NA 142 05/15/2019   K 4.2 05/15/2019   CL 108 05/15/2019   CREATININE 1.41 05/15/2019   BUN 17 05/15/2019   CO2 27 05/15/2019   TSH 2.42 12/04/2018   PSA 3.49 12/04/2018   HGBA1C 5.7 12/04/2018    DG Chest 2 View  Result Date: 09/12/2017 CLINICAL DATA:  Cough, congestion,  tightness on chest x 5 wks, HTN, hx of cardiac cath. EXAM: CHEST - 2 VIEW COMPARISON:  None. FINDINGS: Heart size is upper normal. 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 Lungs are clear. No pleural effusion or pneumothorax seen. 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 No acute or suspicious osseous finding. IMPRESSION: No active cardiopulmonary disease. No evidence of pneumonia or pulmonary edema. Electronically Signed   By: Franki Cabot M.D.   On: 09/12/2017 10:16    Assessment & Plan:   Bruce Jordan was seen today for hand pain.  Diagnoses and all orders for this visit:  Paronychia of right middle finger -     sulfamethoxazole-trimethoprim (BACTRIM DS) 800-160 MG tablet; Take 1 tablet by mouth 2 (two) times daily for 10 days.   I am having Bruce Jordan. "Bruce Jordan" start on sulfamethoxazole-trimethoprim. I am also having him maintain his aspirin EC, cholecalciferol, diclofenac sodium, ferrous sulfate, vitamin C, clobetasol cream, albuterol, EPINEPHrine (EPIPEN IJ), Cetirizine HCl (ZYRTEC PO), atorvastatin, Misc Natural Products (TART CHERRY ADVANCED PO), TURMERIC PO, sildenafil, folic acid, metoprolol tartrate, lisinopril, and citalopram.  Meds ordered this encounter  Medications  . sulfamethoxazole-trimethoprim (BACTRIM DS) 800-160 MG tablet    Sig: Take 1 tablet by mouth 2 (two) times daily for 10 days.    Dispense:  20 tablet    Refill:  0     Follow-up: Return in about 3 weeks (around 08/06/2019).  Bruce Calico, MD

## 2019-07-16 NOTE — Patient Instructions (Signed)
Paronychia Paronychia is an infection of the skin that surrounds a nail. It usually affects the skin around a fingernail, but it may also occur near a toenail. It often causes pain and swelling around the nail. In some cases, a collection of pus (abscess) can form near or under the nail.  This condition may develop suddenly, or it may develop gradually over a longer period. In most cases, paronychia is not serious, and it will clear up with treatment. What are the causes? This condition may be caused by bacteria or a fungus. These germs can enter the body through an opening in the skin, such as a cut or a hangnail. What increases the risk? This condition is more likely to develop in people who:  Get their hands wet often, such as those who work as dishwashers, bartenders, or nurses.  Bite their fingernails or suck their thumbs.  Trim their nails very short.  Have hangnails or injured fingertips.  Get manicures.  Have diabetes. What are the signs or symptoms? Symptoms of this condition include:  Redness and swelling of the skin near the nail.  Tenderness around the nail when you touch the area.  Pus-filled bumps under the skin at the base and sides of the nail (cuticle).  Fluid or pus under the nail.  Throbbing pain in the area. How is this diagnosed? This condition is diagnosed with a physical exam. In some cases, a sample of pus may be tested to determine what type of bacteria or fungus is causing the condition. How is this treated? Treatment depends on the cause and severity of your condition. If your condition is mild, it may clear up on its own in a few days or after soaking in warm water. If needed, treatment may include:  Antibiotic medicine, if your infection is caused by bacteria.  Antifungal medicine, if your infection is caused by a fungus.  A procedure to drain pus from an abscess.  Anti-inflammatory medicine (corticosteroids). Follow these instructions at  home: Wound care  Keep the affected area clean.  Soak the affected area in warm water, if told to do so by your health care provider. You may be told to do this for 20 minutes, 2-3 times a day.  Keep the area dry when you are not soaking it.  Do not try to drain an abscess yourself.  Follow instructions from your health care provider about how to take care of the affected area. Make sure you: ? Wash your hands with soap and water before you change your bandage (dressing). If soap and water are not available, use hand sanitizer. ? Change your dressing as told by your health care provider.  If you had an abscess drained, check the area every day for signs of infection. Check for: ? Redness, swelling, or pain. ? Fluid or blood. ? Warmth. ? Pus or a bad smell. Medicines   Take over-the-counter and prescription medicines only as told by your health care provider.  If you were prescribed an antibiotic medicine, take it as told by your health care provider. Do not stop taking the antibiotic even if you start to feel better. General instructions  Avoid contact with harsh chemicals.  Do not pick at the affected area. Prevention  To prevent this condition from happening again: ? Wear rubber gloves when washing dishes or doing other tasks that require your hands to get wet. ? Wear gloves if your hands might come in contact with cleaners or other chemicals. ? Avoid   injuring your nails or fingertips. ? Do not bite your nails or tear hangnails. ? Do not cut your nails very short. ? Do not cut your cuticles. ? Use clean nail clippers or scissors when trimming nails. Contact a health care provider if:  Your symptoms get worse or do not improve with treatment.  You have continued or increased fluid, blood, or pus coming from the affected area.  Your finger or knuckle becomes swollen or difficult to move. Get help right away if you have:  A fever or chills.  Redness spreading away  from the affected area.  Joint or muscle pain. Summary  Paronychia is an infection of the skin that surrounds a nail. It often causes pain and swelling around the nail. In some cases, a collection of pus (abscess) can form near or under the nail.  This condition may be caused by bacteria or a fungus. These germs can enter the body through an opening in the skin, such as a cut or a hangnail.  If your condition is mild, it may clear up on its own in a few days. If needed, treatment may include medicine or a procedure to drain pus from an abscess.  To prevent this condition from happening again, wear gloves if doing tasks that require your hands to get wet or to come in contact with chemicals. Also avoid injuring your nails or fingertips. This information is not intended to replace advice given to you by your health care provider. Make sure you discuss any questions you have with your health care provider. Document Revised: 04/14/2017 Document Reviewed: 04/10/2017 [employer] Patient Education  2020 [employer]  

## 2019-07-30 ENCOUNTER — Encounter: Payer: Self-pay | Admitting: Family Medicine

## 2019-07-30 ENCOUNTER — Ambulatory Visit (INDEPENDENT_AMBULATORY_CARE_PROVIDER_SITE_OTHER): Payer: Medicare Other | Admitting: Family Medicine

## 2019-07-30 ENCOUNTER — Other Ambulatory Visit: Payer: Self-pay

## 2019-07-30 DIAGNOSIS — M12812 Other specific arthropathies, not elsewhere classified, left shoulder: Secondary | ICD-10-CM | POA: Diagnosis not present

## 2019-07-30 DIAGNOSIS — I2510 Atherosclerotic heart disease of native coronary artery without angina pectoris: Secondary | ICD-10-CM

## 2019-07-30 DIAGNOSIS — M12811 Other specific arthropathies, not elsewhere classified, right shoulder: Secondary | ICD-10-CM

## 2019-07-30 NOTE — Progress Notes (Signed)
[name] [address] [place] [address] Phone: [phone] Subjective:   I Bruce Jordan am serving as a Neurosurgeon for Dr. Antoine Primas.  This visit occurred during the SARS-CoV-2 public health emergency.  Safety protocols were in place, including screening questions prior to the visit, additional usage of staff PPE, and extensive cleaning of exam room while observing appropriate contact time as indicated for disinfecting solutions.   I'm seeing this patient by the request  of:  Etta Grandchild, MD  CC: Bilateral shoulder pain follow-up  [id number]   04/30/2019 Severe rotator cuff arthropathy of the shoulders bilaterally.  We discussed with patient in great length.  Patient is going to continue with conservative therapy.  We can do injections if any worsening pain.  Trial of topical anti-inflammatories given, discussed which activities to do which wants to avoid.  Patient will increase activity slowly.  Follow-up with me again in 3 months  Update 07/30/2019 Bruce Jordan. is a 74 y.o. male coming in with complaint of bilateral shoulder pain. Patient states the shoulders are doing ok. Still taking medications. Still having some pain but overall patient is doing well.  Bilateral shoulder is doing relatively well.  Still decreased range of motion but nothing that stops him from activity at the moment.  Patient has been sleeping comfortably.  Continue with vitamin supplementation.     Past Medical History:  Diagnosis Date  . Chronic ischemic heart disease   . Coronary artery disease   . Hyperlipidemia   . Hypertension   . IFG (impaired fasting glucose)   . Osteoarthritis    Past Surgical History:  Procedure Laterality Date  . CARDIAC CATHETERIZATION     Social History   Socioeconomic History  . Marital status: Married    Spouse name: Not on file  . Number of children: Not on file  . Years of education: Not on file  . Highest  education level: Not on file  Occupational History  . Not on file  Tobacco Use  . Smoking status: Former Games developer  . Smokeless tobacco: Never Used  Substance and Sexual Activity  . Alcohol use: No  . Drug use: No  . Sexual activity: Yes    Birth control/protection: None  Other Topics Concern  . Not on file  Social History Narrative  . Not on file   Social Determinants of Health   Financial Resource Strain:   . Difficulty of Paying Living Expenses:   Food Insecurity:   . Worried About Programme researcher, broadcasting/film/video in the Last Year:   . Barista in the Last Year:   Transportation Needs:   . Freight forwarder (Medical):   Marland Kitchen Lack of Transportation (Non-Medical):   Physical Activity:   . Days of Exercise per Week:   . Minutes of Exercise per Session:   Stress:   . Feeling of Stress :   Social Connections:   . Frequency of Communication with Friends and Family:   . Frequency of Social Gatherings with Friends and Family:   . Attends Religious Services:   . Active Member of Clubs or Organizations:   . Attends Banker Meetings:   Marland Kitchen Marital Status:    No Known Allergies Family History  Problem Relation Age of Onset  . Hypertension Mother   . Heart disease Mother   . Heart disease Father   . Cancer Father   . Early death Brother 48  MVA  . Stroke Brother   . Early death Brother 22       Epilepsy     Current Outpatient Medications (Cardiovascular):  .  atorvastatin (LIPITOR) 40 MG tablet, Take 1 tablet (40 mg total) by mouth daily. Marland Kitchen  EPINEPHrine (EPIPEN IJ), Inject as directed as directed. Marland Kitchen  lisinopril (ZESTRIL) 20 MG tablet, Take 1 tablet (20 mg total) by mouth daily. .  metoprolol tartrate (LOPRESSOR) 25 MG tablet, Take 0.5 tablets (12.5 mg total) by mouth 2 (two) times daily. .  sildenafil (VIAGRA) 100 MG tablet, TAKE 1 TABLET BY MOUTH ONCE DAILY AS NEEDED FOR ERECTILE DYSFUNCTION  Current Outpatient Medications (Respiratory):  .  albuterol  (PROVENTIL HFA;VENTOLIN HFA) 108 (90 Base) MCG/ACT inhaler, Inhale 1-2 puffs into the lungs every 6 (six) hours as needed for wheezing or shortness of breath. .  Cetirizine HCl (ZYRTEC PO), Take 10 mg by mouth daily.  Current Outpatient Medications (Analgesics):  .  aspirin EC 81 MG tablet, Take 81 mg by mouth daily.  Current Outpatient Medications (Hematological):  .  ferrous sulfate 325 (65 FE) MG tablet, Take 325 mg by mouth daily with breakfast. .  folic acid (FOLVITE) 1 MG tablet, Take 1 tablet (1 mg total) by mouth every other day.  Current Outpatient Medications (Other):  Marland Kitchen  Ascorbic Acid (VITAMIN C) 100 MG tablet, Take 100 mg by mouth daily. .  cholecalciferol (VITAMIN D) 400 units TABS tablet, Take 400 Units by mouth daily. .  citalopram (CELEXA) 10 MG tablet, Take 1 tablet (10 mg total) by mouth daily. .  clobetasol cream (TEMOVATE) 0.08 %, Apply 1 application topically 2 (two) times daily. (Patient taking differently: Apply 1 application topically 2 (two) times daily as needed (rash). ) .  diclofenac sodium (VOLTAREN) 1 % GEL, Apply 2 g topically 4 (four) times daily as needed (pain).  .  Misc Natural Products (TART CHERRY ADVANCED PO), Take by mouth. .  TURMERIC PO, Take by mouth.   Reviewed prior external information including notes and imaging from  primary care provider As well as notes that were available from care everywhere and other healthcare systems.  Past medical history, social, surgical and family history all reviewed in electronic medical record.  No pertanent information unless stated regarding to the chief complaint.   Review of Systems:  No headache, visual changes, nausea, vomiting, diarrhea, constipation, dizziness, abdominal pain, skin rash, fevers, chills, night sweats, weight loss, swollen lymph nodes, body aches, joint swelling, chest pain, shortness of breath, mood changes. POSITIVE muscle aches  Objective  Blood pressure 134/76, pulse (!) 56, height  5\' 5"  (1.651 m), weight 138 lb (62.6 kg), SpO2 92 %.   General: No apparent distress alert and oriented x3 mood and affect normal, dressed appropriately.  HEENT: Pupils equal, extraocular movements intact  Respiratory: Patient's speak in full sentences and does not appear short of breath  Cardiovascular: No lower extremity edema, non tender, no erythema  Neuro: Cranial nerves II through XII are intact, neurovascularly intact in all extremities with 2+ DTRs and 2+ pulses.  Gait normal with good balance and coordination.  MSK: Crepitus with range of motion of the shoulders bilaterally.  Significant decrease in range of motion of the right compared to left    Impression and Recommendations:     This case required medical decision making of moderate complexity. The above documentation has been reviewed and is accurate and complete Lyndal Pulley, DO       Note: This  dictation was prepared with Dragon dictation along with smaller phrase technology. Any transcriptional errors that result from this process are unintentional.

## 2019-07-30 NOTE — Assessment & Plan Note (Signed)
Rotator cuff arthropathy but stable at the moment.  Discussed the potential for injections which patient has declined again.  Doing very well with conservative therapy and follow-up again in 4 to 6 months

## 2019-07-30 NOTE — Patient Instructions (Signed)
Keep trucking along See me again in 4 months or sooner

## 2019-10-07 ENCOUNTER — Telehealth: Payer: Self-pay | Admitting: Cardiology

## 2019-10-07 NOTE — Telephone Encounter (Signed)
I attempted to contact patient 10/07/19 to schedule follow up visit from patients recall list with Dr.Jordan. The patient didn't answer, left message for patient to return call to get appt scheduled.

## 2019-11-12 ENCOUNTER — Ambulatory Visit (INDEPENDENT_AMBULATORY_CARE_PROVIDER_SITE_OTHER): Payer: Medicare Other | Admitting: Internal Medicine

## 2019-11-12 ENCOUNTER — Encounter: Payer: Self-pay | Admitting: Internal Medicine

## 2019-11-12 ENCOUNTER — Other Ambulatory Visit: Payer: Self-pay

## 2019-11-12 VITALS — BP 138/78 | HR 50 | Temp 97.7°F | Resp 16 | Ht 65.0 in | Wt 139.0 lb

## 2019-11-12 DIAGNOSIS — I2510 Atherosclerotic heart disease of native coronary artery without angina pectoris: Secondary | ICD-10-CM

## 2019-11-12 DIAGNOSIS — D520 Dietary folate deficiency anemia: Secondary | ICD-10-CM

## 2019-11-12 DIAGNOSIS — N1832 Chronic kidney disease, stage 3b: Secondary | ICD-10-CM

## 2019-11-12 DIAGNOSIS — I10 Essential (primary) hypertension: Secondary | ICD-10-CM | POA: Diagnosis not present

## 2019-11-12 DIAGNOSIS — E785 Hyperlipidemia, unspecified: Secondary | ICD-10-CM

## 2019-11-12 DIAGNOSIS — R7303 Prediabetes: Secondary | ICD-10-CM

## 2019-11-12 DIAGNOSIS — D513 Other dietary vitamin B12 deficiency anemia: Secondary | ICD-10-CM | POA: Diagnosis not present

## 2019-11-12 DIAGNOSIS — R001 Bradycardia, unspecified: Secondary | ICD-10-CM

## 2019-11-12 MED ORDER — ATORVASTATIN CALCIUM 40 MG PO TABS: 40.0000 mg | ORAL_TABLET | Freq: Every day | ORAL | 1 refills | Status: DC

## 2019-11-12 NOTE — Patient Instructions (Signed)

## 2019-11-12 NOTE — Progress Notes (Signed)
Subjective:  Patient ID: Bruce Mondrew T Lapid Jr., male    DOB: 14-Apr-1945  Age: 74 y.o. MRN: [id number]  CC: Hypertension and Hyperlipidemia  This visit occurred during the SARS-CoV-2 public health emergency.  Safety protocols were in place, including screening questions prior to the visit, additional usage of staff PPE, and extensive cleaning of exam room while observing appropriate contact time as indicated for disinfecting solutions.    HPI Bruce Mondrew T Fults Jr. presents for f/up - He is active and denies CP, DOE, dizziness, lightheadedness, near syncope, edema, fatigue, or diaphoresis.  The only time he feels poorly is when he takes a hot shower and that causes him to feel briefly short of breath and exhausted.  He complains of chronic numbness in both fingers.  He denies neck pain or upper extremity weakness.  Outpatient Medications Prior to Visit  Medication Sig Dispense Refill  . albuterol (PROVENTIL HFA;VENTOLIN HFA) 108 (90 Base) MCG/ACT inhaler Inhale 1-2 puffs into the lungs every 6 (six) hours as needed for wheezing or shortness of breath. 18 g 3  . Ascorbic Acid (VITAMIN C) 100 MG tablet Take 100 mg by mouth daily.    Marland Kitchen. aspirin EC 81 MG tablet Take 81 mg by mouth daily.    . Cetirizine HCl (ZYRTEC PO) Take 10 mg by mouth daily.    . cholecalciferol (VITAMIN D) 400 units TABS tablet Take 400 Units by mouth daily.    . citalopram (CELEXA) 10 MG tablet Take 1 tablet (10 mg total) by mouth daily. 90 tablet 1  . clobetasol cream (TEMOVATE) 0.05 % Apply 1 application topically 2 (two) times daily. (Patient taking differently: Apply 1 application topically 2 (two) times daily as needed (rash). ) 60 g 3  . diclofenac sodium (VOLTAREN) 1 % GEL Apply 2 g topically 4 (four) times daily as needed (pain).     Marland Kitchen. EPINEPHrine (EPIPEN IJ) Inject as directed as directed.    . ferrous sulfate 325 (65 FE) MG tablet Take 325 mg by mouth daily with breakfast.    . folic acid (FOLVITE) 1 MG tablet Take 1  tablet (1 mg total) by mouth every other day. 45 tablet 1  . lisinopril (ZESTRIL) 20 MG tablet Take 1 tablet (20 mg total) by mouth daily. 90 tablet 1  . metoprolol tartrate (LOPRESSOR) 25 MG tablet Take 0.5 tablets (12.5 mg total) by mouth 2 (two) times daily. 90 tablet 1  . Misc Natural Products (TART CHERRY ADVANCED PO) Take by mouth.    . sildenafil (VIAGRA) 100 MG tablet TAKE 1 TABLET BY MOUTH ONCE DAILY AS NEEDED FOR ERECTILE DYSFUNCTION 10 tablet 5  . TURMERIC PO Take by mouth.    Marland Kitchen. atorvastatin (LIPITOR) 40 MG tablet Take 1 tablet (40 mg total) by mouth daily. 90 tablet 1   No facility-administered medications prior to visit.    ROS Review of Systems  Constitutional: Negative.  Negative for diaphoresis, fatigue and unexpected weight change.  HENT: Negative.   Eyes: Negative for visual disturbance.  Respiratory: Negative for cough, chest tightness, shortness of breath and wheezing.   Gastrointestinal: Negative for abdominal pain, diarrhea, nausea and vomiting.  Endocrine: Negative.  Negative for cold intolerance.  Genitourinary: Negative for difficulty urinating.  Musculoskeletal: Negative for arthralgias, myalgias, neck pain and neck stiffness.  Skin: Negative.  Negative for color change and pallor.  Neurological: Positive for numbness. Negative for dizziness, syncope, weakness, light-headedness and headaches.       + numbness in his fingers  Hematological: Negative for adenopathy. Does not bruise/bleed easily.  Psychiatric/Behavioral: Negative.     Objective:  BP 138/78 (BP Location: Left Arm, Patient Position: Sitting, Cuff Size: Normal)   Pulse (!) 50   Temp 97.7 F (36.5 C) (Oral)   Resp 16   Ht 5\' 5"  (1.651 m)   Wt 139 lb (63 kg)   SpO2 98%   BMI 23.13 kg/m   BP Readings from Last 3 Encounters:  11/12/19 138/78  07/30/19 134/76  07/16/19 130/80    Wt Readings from Last 3 Encounters:  11/12/19 139 lb (63 kg)  07/30/19 138 lb (62.6 kg)  07/16/19 146 lb  (66.2 kg)    Physical Exam Vitals reviewed.  Constitutional:      Appearance: Normal appearance.  HENT:     Nose: Nose normal.     Mouth/Throat:     Pharynx: Oropharynx is clear.  Eyes:     Pupils: Pupils are equal, round, and reactive to light.  Cardiovascular:     Rate and Rhythm: Regular rhythm. Bradycardia present.     Heart sounds: Normal heart sounds, S1 normal and S2 normal. No murmur heard.  No S3 or S4 sounds.      Comments: EKG - NSR, 53 bpm LAD TWI in V1 and inferior leads is not new No change compared to the prior EKG  Pulmonary:     Effort: Pulmonary effort is normal.     Breath sounds: No stridor. No wheezing, rhonchi or rales.  Abdominal:     General: Abdomen is flat.  Musculoskeletal:     Cervical back: Neck supple.     Right lower leg: No edema.     Left lower leg: No edema.  Lymphadenopathy:     Cervical: No cervical adenopathy.  Skin:    General: Skin is warm and dry.  Neurological:     General: No focal deficit present.     Mental Status: He is alert and oriented to person, place, and time. Mental status is at baseline.     Lab Results  Component Value Date   WBC 5.4 11/12/2019   HGB 13.5 11/12/2019   HCT 40.7 11/12/2019   PLT 127 (L) 11/12/2019   GLUCOSE 104 (H) 11/12/2019   CHOL 153 11/12/2019   TRIG 141 11/12/2019   HDL 48 11/12/2019   LDLDIRECT 77.0 10/04/2017   LDLCALC 81 11/12/2019   ALT 17 11/12/2019   AST 21 11/12/2019   NA 141 11/12/2019   K 4.9 11/12/2019   CL 107 11/12/2019   CREATININE 1.41 (H) 11/12/2019   BUN 18 11/12/2019   CO2 23 11/12/2019   TSH 3.57 11/12/2019   PSA 3.49 12/04/2018   HGBA1C 5.4 11/12/2019    DG Chest 2 View  Result Date: 09/12/2017 CLINICAL DATA:  Cough, congestion, tightness on chest x 5 wks, HTN, hx of cardiac cath. EXAM: CHEST - 2 VIEW COMPARISON:  None. FINDINGS: Heart size is upper normal. 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 Lungs are clear. No pleural effusion or pneumothorax  seen. 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 No acute or suspicious osseous finding. IMPRESSION: No active cardiopulmonary disease. No evidence of pneumonia or pulmonary edema. Electronically Signed   By: 11/12/2017 M.D.   On: 09/12/2017 10:16    Assessment & Plan:   Bruce Jordan was seen today for hypertension and hyperlipidemia.  Diagnoses and all orders for this visit:  Bradycardia, severe sinus-his heart rate is 53  today.  He is tolerating this well.  We will continue the current dose of metoprolol.  He will see his cardiologist soon. -     TSH; Future -     TSH -     EKG 12-Lead  Essential hypertension-his blood pressure is well controlled.  Electrolytes are normal. -     TSH; Future -     Urinalysis, Routine w reflex microscopic; Future -     BASIC METABOLIC PANEL WITH GFR; Future -     BASIC METABOLIC PANEL WITH GFR -     Urinalysis, Routine w reflex microscopic -     TSH  Stage 3b chronic kidney disease-his renal function is stable and his blood pressure is adequately well controlled.  He will avoid nephrotoxic agents. -     Urinalysis, Routine w reflex microscopic; Future -     BASIC METABOLIC PANEL WITH GFR; Future -     BASIC METABOLIC PANEL WITH GFR -     Urinalysis, Routine w reflex microscopic  Dietary folate deficiency anemia-improvement noted.  His B12 and folate levels are normal now. -     CBC with Differential/Platelet; Future -     Vitamin B12; Future -     Folate; Future -     Folate -     Vitamin B12 -     CBC with Differential/Platelet  Hyperlipidemia with target LDL less than 70-he has achieved his LDL goal and is doing well on the statin. -     atorvastatin (LIPITOR) 40 MG tablet; Take 1 tablet (40 mg total) by mouth daily. -     Lipid panel; Future -     TSH; Future -     Hepatic function panel; Future -     Hepatic function panel -     TSH -     Lipid panel  Other dietary vitamin B12  deficiency anemia-his H&H, B12, and folate levels are normal.  We will continue the oral supplements. -     CBC with Differential/Platelet; Future -     Vitamin B12; Future -     Folate; Future -     Folate -     Vitamin B12 -     CBC with Differential/Platelet  Prediabetes- His blood sugar is adequately well controlled. -     Hemoglobin A1c; Future -     Hemoglobin A1c   I am having Bruce Jordan. "[name]" maintain his aspirin EC, cholecalciferol, diclofenac sodium, ferrous sulfate, vitamin C, clobetasol cream, albuterol, EPINEPHrine (EPIPEN IJ), Cetirizine HCl (ZYRTEC PO), Misc Natural Products (TART CHERRY ADVANCED PO), TURMERIC PO, sildenafil, folic acid, metoprolol tartrate, lisinopril, citalopram, and atorvastatin.  Meds ordered this encounter  Medications  . atorvastatin (LIPITOR) 40 MG tablet    Sig: Take 1 tablet (40 mg total) by mouth daily.    Dispense:  90 tablet    Refill:  1   I spent 50 minutes in preparing to see the patient by review of recent labs, imaging and procedures, obtaining and reviewing separately obtained history, communicating with the patient and family or caregiver, ordering medications, tests or procedures, and documenting clinical information in the EHR including the differential Dx, treatment, and any further evaluation and other management of 1. Bradycardia, severe sinus 2. Essential hypertension 3. Stage 3b chronic kidney disease 4. Dietary folate deficiency anemia 5. Hyperlipidemia with target LDL less than 70 6. Other dietary vitamin B12 deficiency anemia 7. Prediabetes      Follow-up:  Return in about 6 months (around 05/14/2020).  Sanda Linger, MD

## 2019-11-13 LAB — URINALYSIS, ROUTINE W REFLEX MICROSCOPIC
Bilirubin Urine: NEGATIVE
Glucose, UA: NEGATIVE
Hgb urine dipstick: NEGATIVE
Ketones, ur: NEGATIVE
Leukocytes,Ua: NEGATIVE
Nitrite: NEGATIVE
Protein, ur: NEGATIVE
Specific Gravity, Urine: 1.016 (ref 1.001–1.03)
pH: 5.5 (ref 5.0–8.0)

## 2019-11-13 LAB — CBC WITH DIFFERENTIAL/PLATELET
Absolute Monocytes: 524 cells/uL (ref 200–950)
Basophils Absolute: 38 cells/uL (ref 0–200)
Basophils Relative: 0.7 %
Eosinophils Absolute: 313 cells/uL (ref 15–500)
Eosinophils Relative: 5.8 %
HCT: 40.7 % (ref 38.5–50.0)
Hemoglobin: 13.5 g/dL (ref 13.2–17.1)
Lymphs Abs: 502 cells/uL — ABNORMAL LOW (ref 850–3900)
MCH: 31.5 pg (ref 27.0–33.0)
MCHC: 33.2 g/dL (ref 32.0–36.0)
MCV: 94.9 fL (ref 80.0–100.0)
MPV: 9.5 fL (ref 7.5–12.5)
Monocytes Relative: 9.7 %
Neutro Abs: 4023 cells/uL (ref 1500–7800)
Neutrophils Relative %: 74.5 %
Platelets: 127 10*3/uL — ABNORMAL LOW (ref 140–400)
RBC: 4.29 10*6/uL (ref 4.20–5.80)
RDW: 12.8 % (ref 11.0–15.0)
Total Lymphocyte: 9.3 %
WBC: 5.4 10*3/uL (ref 3.8–10.8)

## 2019-11-13 LAB — BASIC METABOLIC PANEL WITH GFR
BUN/Creatinine Ratio: 13 (calc) (ref 6–22)
BUN: 18 mg/dL (ref 7–25)
CO2: 23 mmol/L (ref 20–32)
Calcium: 9.6 mg/dL (ref 8.6–10.3)
Chloride: 107 mmol/L (ref 98–110)
Creat: 1.41 mg/dL — ABNORMAL HIGH (ref 0.70–1.18)
GFR, Est African American: 56 mL/min/{1.73_m2} — ABNORMAL LOW (ref >=60)
GFR, Est Non African American: 48 mL/min/{1.73_m2} — ABNORMAL LOW (ref >=60)
Glucose, Bld: 104 mg/dL — ABNORMAL HIGH (ref 65–99)
Potassium: 4.9 mmol/L (ref 3.5–5.3)
Sodium: 141 mmol/L (ref 135–146)

## 2019-11-13 LAB — HEPATIC FUNCTION PANEL
AG Ratio: 1.8 (calc) (ref 1.0–2.5)
ALT: 17 U/L (ref 9–46)
AST: 21 U/L (ref 10–35)
Albumin: 4.3 g/dL (ref 3.6–5.1)
Alkaline phosphatase (APISO): 112 U/L (ref 35–144)
Bilirubin, Direct: 0.2 mg/dL (ref 0.0–0.2)
Globulin: 2.4 g/dL (calc) (ref 1.9–3.7)
Indirect Bilirubin: 0.5 mg/dL (calc) (ref 0.2–1.2)
Total Bilirubin: 0.7 mg/dL (ref 0.2–1.2)
Total Protein: 6.7 g/dL (ref 6.1–8.1)

## 2019-11-13 LAB — TSH: TSH: 3.57 mIU/L (ref 0.40–4.50)

## 2019-11-13 LAB — HEMOGLOBIN A1C
Hgb A1c MFr Bld: 5.4 % of total Hgb (ref <5.7)
Mean Plasma Glucose: 108 (calc)
eAG (mmol/L): 6 (calc)

## 2019-11-13 LAB — LIPID PANEL
Cholesterol: 153 mg/dL (ref <200)
HDL: 48 mg/dL (ref >=40)
LDL Cholesterol (Calc): 81 mg/dL (calc)
Non-HDL Cholesterol (Calc): 105 mg/dL (calc) (ref <130)
Total CHOL/HDL Ratio: 3.2 (calc) (ref <5.0)
Triglycerides: 141 mg/dL (ref <150)

## 2019-11-13 LAB — VITAMIN B12: Vitamin B-12: 468 pg/mL (ref 200–1100)

## 2019-11-13 LAB — FOLATE: Folate: 23.9 ng/mL

## 2019-12-10 NOTE — Progress Notes (Signed)
Cardiology Office Note    Date:  12/12/2019   ID:  Bruce Jordan., DOB 1946-02-20, MRN [id number]  PCP:  Etta Grandchild, MD  Cardiologist:  Keishia Ground [place], MD    History of Present Illness:  Bruce Jordan. is a 74 y.o. male seen for follow up CAD. He has a history of CAD s/p CABG in 2008 in [place] for failed PCI. This was a LIMA to the LAD. In 2013 he had PCI of a tortuous RCA. In April 2016 he had a low risk Cardiolite without evidence of ischemia. He has a history of HTN and dyslipidemia. 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  He moved to [place] to be closer to family. He lives in  an Apartment with his wife.  He reports he is doing well. The only thing he notes is if he takes a hot shower he feels anxious and he goes and rests for 15-20 minutes and it goes away.  He denies any chest pain, SOB, palpitations, dizziness.    Past Medical History:  Diagnosis Date  . Chronic ischemic heart disease   . Coronary artery disease   . Hyperlipidemia   . Hypertension   . IFG (impaired fasting glucose)   . Osteoarthritis     Past Surgical History:  Procedure Laterality Date  . CARDIAC CATHETERIZATION      Current Medications: Outpatient Medications Prior to Visit  Medication Sig Dispense Refill  . albuterol (PROVENTIL HFA;VENTOLIN HFA) 108 (90 Base) MCG/ACT inhaler Inhale 1-2 puffs into the lungs every 6 (six) hours as needed for wheezing or shortness of breath. 18 g 3  . Ascorbic Acid (VITAMIN C) 100 MG tablet Take 100 mg by mouth daily.    Marland Kitchen aspirin EC 81 MG tablet Take 81 mg by mouth daily.    Marland Kitchen atorvastatin (LIPITOR) 40 MG tablet Take 1 tablet (40 mg total) by mouth daily. 90 tablet 1  . Cetirizine HCl (ZYRTEC PO) Take 10 mg by mouth daily.    . cholecalciferol (VITAMIN D) 400 units TABS tablet Take 400 Units by mouth daily.    . citalopram (CELEXA) 10 MG tablet Take 1 tablet (10 mg total) by mouth daily. 90 tablet 1  . clobetasol  cream (TEMOVATE) 0.05 % Apply 1 application topically 2 (two) times daily. (Patient taking differently: Apply 1 application topically 2 (two) times daily as needed (rash). ) 60 g 3  . diclofenac sodium (VOLTAREN) 1 % GEL Apply 2 g topically 4 (four) times daily as needed (pain).     Marland Kitchen EPINEPHrine (EPIPEN IJ) Inject as directed as directed.    . ferrous sulfate 325 (65 FE) MG tablet Take 325 mg by mouth daily with breakfast.    . folic acid (FOLVITE) 1 MG tablet Take 1 tablet (1 mg total) by mouth every other day. 45 tablet 1  . lisinopril (ZESTRIL) 20 MG tablet Take 1 tablet (20 mg total) by mouth daily. 90 tablet 1  . Misc Natural Products (TART CHERRY ADVANCED PO) Take by mouth.    . sildenafil (VIAGRA) 100 MG tablet TAKE 1 TABLET BY MOUTH ONCE DAILY AS NEEDED FOR ERECTILE DYSFUNCTION 10 tablet 5  . TURMERIC PO Take by mouth.    . metoprolol tartrate (LOPRESSOR) 25 MG tablet Take 0.5 tablets (12.5 mg total) by mouth 2 (two) times daily. 90 tablet 1   No facility-administered medications prior to visit.     Allergies:  Patient has no known allergies.   Social History   Socioeconomic History  . Marital status: Married    Spouse name: Not on file  . Number of children: Not on file  . Years of education: Not on file  . Highest education level: Not on file  Occupational History  . Not on file  Tobacco Use  . Smoking status: Former Games developer  . Smokeless tobacco: Never Used  Vaping Use  . Vaping Use: Never used  Substance and Sexual Activity  . Alcohol use: No  . Drug use: No  . Sexual activity: Yes    Birth control/protection: None  Other Topics Concern  . Not on file  Social History Narrative  . Not on file   Social Determinants of Health   Financial Resource Strain:   . Difficulty of Paying Living Expenses: Not on file  Food Insecurity:   . Worried About Programme researcher, broadcasting/film/video in the Last Year: Not on file  . Ran Out of Food in the Last Year: Not on file  Transportation  Needs:   . Lack of Transportation (Medical): Not on file  . Lack of Transportation (Non-Medical): Not on file  Physical Activity:   . Days of Exercise per Week: Not on file  . Minutes of Exercise per Session: Not on file  Stress:   . Feeling of Stress : Not on file  Social Connections:   . Frequency of Communication with Friends and Family: Not on file  . Frequency of Social Gatherings with Friends and Family: Not on file  . Attends Religious Services: Not on file  . Active Member of Clubs or Organizations: Not on file  . Attends Banker Meetings: Not on file  . Marital Status: Not on file     Family History:  The patient's family history includes Cancer in his father; Early death (age of onset: 63) in his brother; Early death (age of onset: 4) in his brother; Heart disease in his father and mother; Hypertension in his mother; Stroke in his brother.   ROS:   Please see the history of present illness.    ROS All other systems reviewed and are negative.   PHYSICAL EXAM:   VS:  BP 118/72   Pulse (!) 52   Ht 5\' 5"  (1.651 m)   Wt 143 lb 3.2 oz (65 kg)   SpO2 99%   BMI 23.83 kg/m    GENERAL:  Well appearing WM in NAD HEENT:  PERRL, EOMI, sclera are clear. Oropharynx is clear. NECK:  No jugular venous distention, carotid upstroke brisk and symmetric, no bruits, no thyromegaly or adenopathy LUNGS:  Clear to auscultation bilaterally CHEST:  Unremarkable HEART:  RRR with extrasystoles,  PMI not displaced or sustained,S1 and S2 within normal limits, no S3, no S4: no clicks, no rubs, no murmurs ABD:  Soft, nontender. BS +, no masses or bruits. No hepatomegaly, no splenomegaly EXT:  2 + pulses throughout, no edema, no cyanosis no clubbing SKIN:  Warm and dry.  No rashes NEURO:  Alert and oriented x 3. Cranial nerves II through XII intact. PSYCH:  Cognitively intact   Wt Readings from Last 3 Encounters:  12/12/19 143 lb 3.2 oz (65 kg)  11/12/19 139 lb (63 kg)  07/30/19  138 lb (62.6 kg)      Studies/Labs Reviewed:   EKG:  EKG is  ordered today.  Sinus brady with rate 54,  LAD, Nonspecific ST abnormality. I have personally reviewed and  interpreted this study.    Recent Labs: 11/12/2019: ALT 17; BUN 18; Creat 1.41; Hemoglobin 13.5; Platelets 127; Potassium 4.9; Sodium 141; TSH 3.57   Lipid Panel    Component Value Date/Time   CHOL 153 11/12/2019 0932   TRIG 141 11/12/2019 0932   HDL 48 11/12/2019 0932   CHOLHDL 3.2 11/12/2019 0932   VLDL 35.8 12/04/2018 1534   LDLCALC 81 11/12/2019 0932   LDLDIRECT 77.0 10/04/2017 1607    Additional studies/ records that were reviewed today include:  Labs dated 08/24/15: glucose 177, creatinine 1.47. Otherwise CMET normal. Platelets 118K, otherwise CBC is normal. A1c 5.9%. TSH normal. Cholesterol 170, triglycerides 119, LDL 85, HDL 61. LDL particle number [id number]. Dated 05/26/16: cholesterol 144, triglycerides 98, HDL 62, LDL 62. A1c 5.7%. Creatinine 1.27. Hgb, TSH, ALT normal. Dated 10/04/17: cholesterol 162, triglycerides 240, HDL 70, LDL 77. A1c 5.7%. Creatinine 1.41. ALT normal. CBC normal.   ASSESSMENT:   1. CAD s/p CABG in 2008. Last Myoview in 2016 looked good. He is asymptomatic. Continue medical therapy with ASA, statin. 2. HTN well controlled continue  lisinopril 3. Hypercholesterolemia. On high dose statin. LDL is at goal. Triglycerides have improved. 4. CKD stage 3. Recommend he minimize use of NSAIDs.  5. Feeling of anxiety when taking a hot shower. I wonder if this isn't related to his bradycardia along with some vasodilation. Will discontinue low dose metoprolol.   Follow up in one year.    Medication Adjustments/Labs and Tests Ordered: Current medicines are reviewed at length with the patient today.  Concerns regarding medicines are outlined above.  Medication changes, Labs and Tests ordered today are listed in the Patient Instructions below. There are no Patient Instructions on file for this visit.    Signed, Katheryn Culliton [place], MD  12/12/2019 4:28 PM    [facility] [address], [place], [place], [address] [phone]

## 2019-12-12 ENCOUNTER — Ambulatory Visit (INDEPENDENT_AMBULATORY_CARE_PROVIDER_SITE_OTHER): Payer: Medicare Other | Admitting: Cardiology

## 2019-12-12 ENCOUNTER — Other Ambulatory Visit: Payer: Self-pay

## 2019-12-12 ENCOUNTER — Encounter: Payer: Self-pay | Admitting: Cardiology

## 2019-12-12 VITALS — BP 118/72 | HR 52 | Ht 65.0 in | Wt 143.2 lb

## 2019-12-12 DIAGNOSIS — N1831 Chronic kidney disease, stage 3a: Secondary | ICD-10-CM | POA: Diagnosis not present

## 2019-12-12 DIAGNOSIS — E7800 Pure hypercholesterolemia, unspecified: Secondary | ICD-10-CM

## 2019-12-12 DIAGNOSIS — I2510 Atherosclerotic heart disease of native coronary artery without angina pectoris: Secondary | ICD-10-CM | POA: Diagnosis not present

## 2019-12-12 DIAGNOSIS — I10 Essential (primary) hypertension: Secondary | ICD-10-CM | POA: Diagnosis not present

## 2019-12-12 DIAGNOSIS — I25118 Atherosclerotic heart disease of native coronary artery with other forms of angina pectoris: Secondary | ICD-10-CM | POA: Diagnosis not present

## 2019-12-12 NOTE — Patient Instructions (Signed)
Stop taking metoprolol

## 2019-12-20 ENCOUNTER — Encounter: Payer: Self-pay | Admitting: Internal Medicine

## 2019-12-20 ENCOUNTER — Other Ambulatory Visit: Payer: Self-pay | Admitting: Internal Medicine

## 2019-12-20 DIAGNOSIS — N5201 Erectile dysfunction due to arterial insufficiency: Secondary | ICD-10-CM

## 2019-12-20 DIAGNOSIS — I10 Essential (primary) hypertension: Secondary | ICD-10-CM

## 2019-12-20 DIAGNOSIS — I2510 Atherosclerotic heart disease of native coronary artery without angina pectoris: Secondary | ICD-10-CM

## 2019-12-20 MED ORDER — SILDENAFIL CITRATE 100 MG PO TABS: ORAL_TABLET | ORAL | 1 refills | Status: DC

## 2019-12-20 MED ORDER — LISINOPRIL 20 MG PO TABS: 20.0000 mg | ORAL_TABLET | Freq: Every day | ORAL | 1 refills | Status: DC

## 2020-01-13 ENCOUNTER — Telehealth: Payer: Self-pay | Admitting: Cardiology

## 2020-01-13 NOTE — Telephone Encounter (Signed)
lvm for patient to return call to get follow up scheduled with Jordan from recall list 

## 2020-01-20 DIAGNOSIS — Z23 Encounter for immunization: Secondary | ICD-10-CM | POA: Diagnosis not present

## 2020-01-30 ENCOUNTER — Other Ambulatory Visit: Payer: Self-pay | Admitting: Internal Medicine

## 2020-01-30 ENCOUNTER — Encounter: Payer: Self-pay | Admitting: Internal Medicine

## 2020-01-30 DIAGNOSIS — F418 Other specified anxiety disorders: Secondary | ICD-10-CM

## 2020-01-30 MED ORDER — CITALOPRAM HYDROBROMIDE 10 MG PO TABS: 10.0000 mg | ORAL_TABLET | Freq: Every day | ORAL | 1 refills | Status: DC

## 2020-02-09 DIAGNOSIS — Z23 Encounter for immunization: Secondary | ICD-10-CM | POA: Diagnosis not present

## 2020-02-24 ENCOUNTER — Encounter: Payer: Self-pay | Admitting: Internal Medicine

## 2020-02-24 ENCOUNTER — Telehealth: Payer: Self-pay | Admitting: Internal Medicine

## 2020-02-24 ENCOUNTER — Ambulatory Visit (INDEPENDENT_AMBULATORY_CARE_PROVIDER_SITE_OTHER): Payer: Medicare Other | Admitting: Internal Medicine

## 2020-02-24 ENCOUNTER — Other Ambulatory Visit: Payer: Self-pay

## 2020-02-24 VITALS — BP 144/82 | HR 69 | Temp 98.3°F | Wt 140.4 lb

## 2020-02-24 DIAGNOSIS — I2510 Atherosclerotic heart disease of native coronary artery without angina pectoris: Secondary | ICD-10-CM | POA: Diagnosis not present

## 2020-02-24 DIAGNOSIS — M7041 Prepatellar bursitis, right knee: Secondary | ICD-10-CM

## 2020-02-24 HISTORY — DX: Prepatellar bursitis, right knee: M70.41

## 2020-02-24 MED ORDER — NUZYRA 150 MG PO TABS: 2.0000 | ORAL_TABLET | Freq: Every day | ORAL | 0 refills | Status: DC

## 2020-02-24 MED ORDER — METHYLPREDNISOLONE ACETATE 40 MG/ML IJ SUSP
40.0000 mg | Freq: Once | INTRAMUSCULAR | Status: AC
  Administered 2020-02-24: 40 mg via INTRA_ARTICULAR

## 2020-02-24 MED ORDER — OMADACYCLINE TOSYLATE 150 MG PO TABS: 3.0000 | ORAL_TABLET | Freq: Every day | ORAL | 0 refills | Status: AC

## 2020-02-24 NOTE — Telephone Encounter (Signed)
    Bruce Jordan from [employer], patient doesn't have drug coverage for Omadacycline Tosylate (NUZYRA) 150 MG TABS

## 2020-02-24 NOTE — Progress Notes (Signed)
Subjective:  Patient ID: Bruce Jordan., male    DOB: [birth year]  Age: 74 y.o. MRN: [id number]  CC: Joint Swelling (right knee x 4 days; 4 on 0/10 pain scale when walking)  This visit occurred during the SARS-CoV-2 public health emergency.  Safety protocols were in place, including screening questions prior to the visit, additional usage of staff PPE, and extensive cleaning of exam room while observing appropriate contact time as indicated for disinfecting solutions.    HPI Bruce Jordan. presents for a 4 day hx of pain, redness, swelling over the right knee. He noticed the pain after he pushed in a dehumidifier drawer.  Outpatient Medications Prior to Visit  Medication Sig Dispense Refill  . albuterol (PROVENTIL HFA;VENTOLIN HFA) 108 (90 Base) MCG/ACT inhaler Inhale 1-2 puffs into the lungs every 6 (six) hours as needed for wheezing or shortness of breath. 18 g 3  . Ascorbic Acid (VITAMIN C) 100 MG tablet Take 100 mg by mouth daily.    Marland Kitchen aspirin EC 81 MG tablet Take 81 mg by mouth daily.    Marland Kitchen atorvastatin (LIPITOR) 40 MG tablet Take 1 tablet (40 mg total) by mouth daily. 90 tablet 1  . Cetirizine HCl (ZYRTEC PO) Take 10 mg by mouth daily.    . cholecalciferol (VITAMIN D) 400 units TABS tablet Take 400 Units by mouth daily.    . citalopram (CELEXA) 10 MG tablet Take 1 tablet (10 mg total) by mouth daily. 90 tablet 1  . clobetasol cream (TEMOVATE) 0.05 % Apply 1 application topically 2 (two) times daily. (Patient taking differently: Apply 1 application topically 2 (two) times daily as needed (rash). ) 60 g 3  . diclofenac sodium (VOLTAREN) 1 % GEL Apply 2 g topically 4 (four) times daily as needed (pain).     Marland Kitchen EPINEPHrine (EPIPEN IJ) Inject as directed as directed.    . ferrous sulfate 325 (65 FE) MG tablet Take 325 mg by mouth daily with breakfast.    . folic acid (FOLVITE) 1 MG tablet Take 1 tablet (1 mg total) by mouth every other day. 45 tablet 1  . lisinopril  (ZESTRIL) 20 MG tablet Take 1 tablet (20 mg total) by mouth daily. 90 tablet 1  . Misc Natural Products (TART CHERRY ADVANCED PO) Take by mouth.    . sildenafil (VIAGRA) 100 MG tablet TAKE 1 TABLET BY MOUTH ONCE DAILY AS NEEDED FOR ERECTILE DYSFUNCTION 5 tablet 1  . TURMERIC PO Take by mouth.     No facility-administered medications prior to visit.    ROS Review of Systems  Constitutional: Negative for chills, fatigue and fever.  HENT: Negative.   Eyes: Negative.   Respiratory: Negative for cough, chest tightness, shortness of breath and wheezing.   Cardiovascular: Negative for chest pain, palpitations and leg swelling.  Gastrointestinal: Negative for abdominal pain, diarrhea and nausea.  Endocrine: Negative.   Genitourinary: Negative.   Musculoskeletal: Positive for arthralgias.  Skin: Positive for color change. Negative for pallor and rash.  Neurological: Negative.  Negative for dizziness, weakness and light-headedness.  Hematological: Negative for adenopathy. Does not bruise/bleed easily.  Psychiatric/Behavioral: Negative.     Objective:  BP (!) 144/82 (BP Location: Left Arm, Patient Position: Sitting, Cuff Size: Normal)   Pulse 69   Temp 98.3 F (36.8 C) (Oral)   Wt 140 lb 6.4 oz (63.7 kg)   SpO2 96%   BMI 23.36 kg/m   BP Readings from Last 3  Encounters:  02/24/20 (!) 144/82  12/12/19 118/72  11/12/19 138/78    Wt Readings from Last 3 Encounters:  02/24/20 140 lb 6.4 oz (63.7 kg)  12/12/19 143 lb 3.2 oz (65 kg)  11/12/19 139 lb (63 kg)    Physical Exam Musculoskeletal:     Right knee: Swelling, effusion and erythema present. No deformity, lacerations, bony tenderness or crepitus. Normal range of motion. Tenderness present.       Legs:     Lab Results  Component Value Date   WBC 5.4 11/12/2019   HGB 13.5 11/12/2019   HCT 40.7 11/12/2019   PLT 127 (L) 11/12/2019   GLUCOSE 104 (H) 11/12/2019   CHOL 153 11/12/2019   TRIG 141 11/12/2019   HDL 48  11/12/2019   LDLDIRECT 77.0 10/04/2017   LDLCALC 81 11/12/2019   ALT 17 11/12/2019   AST 21 11/12/2019   NA 141 11/12/2019   K 4.9 11/12/2019   CL 107 11/12/2019   CREATININE 1.41 (H) 11/12/2019   BUN 18 11/12/2019   CO2 23 11/12/2019   TSH 3.57 11/12/2019   PSA 3.49 12/04/2018   HGBA1C 5.4 11/12/2019    DG Chest 2 View  Result Date: 09/12/2017 CLINICAL DATA:  Cough, congestion, tightness on chest x 5 wks, HTN, hx of cardiac cath. EXAM: CHEST - 2 VIEW COMPARISON:  None. FINDINGS: Heart size is upper normal. 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 Lungs are clear. No pleural effusion or pneumothorax seen. 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 No acute or suspicious osseous finding. IMPRESSION: No active cardiopulmonary disease. No evidence of pneumonia or pulmonary edema. Electronically Signed   By: Bary Richard M.D.   On: 09/12/2017 10:16   Component 6 d ago  Source: KNEE, RIGHT   Status: FINAL   Gram Stain: No organisms or white blood cells seen   RESULT: No Growth   Resulting Agency [facility]      Specimen Collected: 02/24/20 14:48 Last Resulted: 02/29/20 07:47      Lab Flowsheet    Order Details    View Encounter    Lab and Collection Details    Routing    Result History       Result Care Coordination   Patient Communication   Add Comments  Add Notifications Back to Top      Contains abnormal dataSynovial Fluid Analysis, Complete Order: [id number] Status:  Final result Visible to patient:  Yes (not seen)  0 Result Notes  Ref Range & Units 6 d ago  Site  RIGHT KNEE   Color, Synovial STRAW/YELL ORANGEAbnormal   Appearance-Synovial CLEAR/HAZY CLOUDYAbnormal   WBC, Synovial <150 cells/uL 2,163High   Neutrophil, Synovial 0 - 24 % 83High   Lymphocytes-Synovial Fld 0 - 74 % 10   Monocyte/Macrophage 0 - 69 % 7   Eosinophils-Synovial 0 - 2 % 0   Basophils, % 0 % 0   Synoviocytes, %  0 - 15 % 0   Crystals, Fluid NONE SEEN /HPF   Comment: Intracellular        After verbal consent was obtained. Using sterile technique the right knee was prepped with betadine and Lidocaine 2% with epi was used as local anesthetic (3 cc's used). The prepatellar bursa was entered and 5 ml's of straw colored fluid was withdrawn and sent for culture and screen for crystals.  Depo-medrol 40 mg then injected into the bursa.  The procedure was well tolerated.  The patient  is asked to continue to rest the joint for a few more days before resuming regular activities.  It may be more painful for the first 1-2 days.  There was no blood loss. He tolerated the procedure well.   Assessment & Plan:   Cadel was seen today for joint swelling.  Diagnoses and all orders for this visit:  Prepatellar bursitis, right knee- This is like a strep/staph infection. Will treat with a broad spectrum antibiotic with good penetration of the area. The steroid injection should help with pain and swelling. -     WOUND CULTURE; Future -     Synovial cell count + diff, w/ crystals -     Omadacycline Tosylate 150 MG TABS; Take 3 tablets by mouth daily for 1 day. -     Discontinue: Omadacycline Tosylate (NUZYRA) 150 MG TABS; Take 2 tablets by mouth daily for 7 days. -     WOUND CULTURE -     methylPREDNISolone acetate (DEPO-MEDROL) injection 40 mg -     Omadacycline Tosylate (NUZYRA) 150 MG TABS; Take 2 tablets by mouth daily for 7 days.   I am having Greig Castilla [name] "[name]" start on Omadacycline Tosylate. I am also having him maintain his aspirin EC, cholecalciferol, diclofenac sodium, ferrous sulfate, vitamin C, clobetasol cream, albuterol, EPINEPHrine (EPIPEN IJ), Cetirizine HCl (ZYRTEC PO), Misc Natural Products (TART CHERRY ADVANCED PO), TURMERIC PO, folic acid, atorvastatin, sildenafil, lisinopril, citalopram, and Nuzyra. We administered methylPREDNISolone acetate.  Meds ordered this encounter  Medications  .  Omadacycline Tosylate 150 MG TABS    Sig: Take 3 tablets by mouth daily for 1 day.    Dispense:  6 tablet    Refill:  0  . DISCONTD: Omadacycline Tosylate (NUZYRA) 150 MG TABS    Sig: Take 2 tablets by mouth daily for 7 days.    Dispense:  14 tablet    Refill:  0  . methylPREDNISolone acetate (DEPO-MEDROL) injection 40 mg  . Omadacycline Tosylate (NUZYRA) 150 MG TABS    Sig: Take 2 tablets by mouth daily for 7 days.    Dispense:  14 tablet    Refill:  0     Follow-up: Return in about 3 days (around 02/27/2020).  Sanda Linger, MD

## 2020-02-24 NOTE — Patient Instructions (Signed)
Prepatellar Bursitis  Prepatellar bursitis is inflammation of the prepatellar bursa, which is a fluid-filled sac that cushions the kneecap (patella). Prepatellar bursitis happens when fluid builds up in this sac and causes it to swell. The condition causes knee pain. What are the causes? This condition may be caused by:  Constant pressure on the knees from kneeling.  A hit to the knee.  Falling on the knee.  Infection from bacteria.  Moving the knee often in a forceful way. What increases the risk? You are more likely to develop this condition if:  You play sports that have a high risk of falling on the knee or being hit on the knee. These include football, wrestling, basketball, or soccer.  You do work in which you kneel for long periods of time, such as roofing, plumbing, or gardening.  You have another inflammatory condition, such as gout or rheumatoid arthritis. What are the signs or symptoms? The most common symptom of this condition is knee pain that gets better with rest. Other symptoms include:  Swelling on the front of the kneecap.  Warmth in the knee.  Tenderness with activity.  Redness in the knee.  Inability to bend the knee or to kneel. How is this diagnosed? This condition is diagnosed based on:  A physical exam. Your health care provider will compare your knees and check for tenderness and pain while moving your knee.  Your medical history.  Tests to check for infection. These may include blood tests and tests on the fluid in the bursa.  Imaging tests, such as X-ray, MRI, or ultrasound, to check for damage in the patella, or fluid buildup and swelling in the bursa. How is this treated? This condition may be treated by:  Resting the knee.  Putting ice on the knee.  Taking medicines, such as: ? NSAIDs. These can help to reduce pain and swelling. ? Antibiotics. These may be needed if you have an infection. ? Steroids. These are used to reduce  swelling and inflammation, and may be prescribed if other treatments are not helping.  Raising (elevating) the knee while resting.  Doing exercises to help you maintain movement (physical therapy). These may be recommended after pain and swelling improve.  Having a procedure to remove fluid from the bursa. This may be done if other treatments are not helping.  Having surgery to remove the bursa. This may be done if you have a severe infection or if the condition keeps coming back after treatment. Follow these instructions at home: Medicines  Take over-the-counter and prescription medicines only as told by your health care provider.  If you were prescribed an antibiotic medicine, take it as told by your health care provider. Do not stop taking the antibiotic even if you start to feel better. Managing pain, stiffness, and swelling   If directed, put ice on the injured area. ? Put ice in a plastic bag. ? Place a towel between your skin and the bag. ? Leave the ice on for 20 minutes, 2-3 times a day.  Elevate the injured area above the level of your heart while you are sitting or lying down. Activity  Do not use the injured limb to support your body weight until your health care provider says that you can.  Rest your knee.  Avoid activities that cause knee pain.  Return to your normal activities as told by your health care provider. Ask your health care provider what activities are safe for you.  Do exercises as   told by your health care provider. General instructions  Ask your health care provider when it is safe for you to drive.  Do not use any products that contain nicotine or tobacco, such as cigarettes, e-cigarettes, and chewing tobacco. These can delay healing. If you need help quitting, ask your health care provider.  Keep all follow-up visits as told by your health care provider. This is important. How is this prevented?  Warm up and stretch before being  active.  Cool down and stretch after being active.  Give your body time to rest between periods of activity.  Maintain physical fitness, including strength and flexibility.  Be safe and responsible while being active. This will help you to avoid falls.  Wear knee pads if you have to kneel for a long period of time. Contact a health care provider if:  Your symptoms do not improve or get worse.  Your symptoms keep coming back after treatment.  You develop a fever and have warmth, redness, or swelling over your knee. Summary  Prepatellar bursitis is inflammation of the prepatellar bursa, which is a fluid-filled sac that cushions the kneecap (patella).  This condition may be caused by injury or constant pressure on the knee. It may also be caused by an infection from bacteria.  Symptoms of this condition include pain, swelling, warmth, and tenderness in the knee.  Follow instructions from your health care provider about taking medicines, resting, and doing activities.  Contact your health care provider if your symptoms do not improve, get worse, or keep coming back after treatment. This information is not intended to replace advice given to you by your health care provider. Make sure you discuss any questions you have with your health care provider. Document Revised: 07/20/2018 Document Reviewed: 06/07/2018 Elsevier Patient Education  2020 [employer]  

## 2020-02-26 MED ORDER — NUZYRA 150 MG PO TABS: 2.0000 | ORAL_TABLET | Freq: Every day | ORAL | 0 refills | Status: AC

## 2020-02-26 NOTE — Telephone Encounter (Signed)
Noted  

## 2020-02-29 LAB — SYNOVIAL FLUID ANALYSIS, COMPLETE
Basophils, %: 0 %
Eosinophils-Synovial: 0 % (ref 0–2)
Lymphocytes-Synovial Fld: 10 % (ref 0–74)
Monocyte/Macrophage: 7 % (ref 0–69)
Neutrophil, Synovial: 83 % — ABNORMAL HIGH (ref 0–24)
Synoviocytes, %: 0 % (ref 0–15)
WBC, Synovial: 2163 cells/uL — ABNORMAL HIGH (ref <150)

## 2020-02-29 LAB — WOUND CULTURE
Gram Stain:: NONE SEEN
RESULT:: NO GROWTH

## 2020-03-06 ENCOUNTER — Encounter: Payer: Self-pay | Admitting: Internal Medicine

## 2020-03-09 ENCOUNTER — Other Ambulatory Visit: Payer: Self-pay | Admitting: Internal Medicine

## 2020-03-09 DIAGNOSIS — L309 Dermatitis, unspecified: Secondary | ICD-10-CM

## 2020-03-09 MED ORDER — CLOBETASOL PROPIONATE 0.05 % EX CREA: 1.0000 "application " | TOPICAL_CREAM | Freq: Two times a day (BID) | CUTANEOUS | 3 refills | Status: AC | PRN

## 2020-05-22 ENCOUNTER — Encounter: Payer: Self-pay | Admitting: Internal Medicine

## 2020-05-22 DIAGNOSIS — E785 Hyperlipidemia, unspecified: Secondary | ICD-10-CM

## 2020-05-22 MED ORDER — ATORVASTATIN CALCIUM 40 MG PO TABS: 40.0000 mg | ORAL_TABLET | Freq: Every day | ORAL | 0 refills | Status: DC

## 2020-06-23 ENCOUNTER — Other Ambulatory Visit: Payer: Self-pay | Admitting: Internal Medicine

## 2020-06-23 ENCOUNTER — Encounter: Payer: Self-pay | Admitting: Internal Medicine

## 2020-06-23 DIAGNOSIS — I10 Essential (primary) hypertension: Secondary | ICD-10-CM

## 2020-06-23 DIAGNOSIS — I2510 Atherosclerotic heart disease of native coronary artery without angina pectoris: Secondary | ICD-10-CM

## 2020-06-23 MED ORDER — LISINOPRIL 20 MG PO TABS: 20.0000 mg | ORAL_TABLET | Freq: Every day | ORAL | 1 refills | Status: DC

## 2020-06-24 DIAGNOSIS — M19011 Primary osteoarthritis, right shoulder: Secondary | ICD-10-CM | POA: Diagnosis not present

## 2020-06-24 DIAGNOSIS — M19012 Primary osteoarthritis, left shoulder: Secondary | ICD-10-CM | POA: Diagnosis not present

## 2020-08-12 ENCOUNTER — Other Ambulatory Visit: Payer: Self-pay | Admitting: Internal Medicine

## 2020-08-12 DIAGNOSIS — F418 Other specified anxiety disorders: Secondary | ICD-10-CM

## 2020-08-19 ENCOUNTER — Other Ambulatory Visit: Payer: Self-pay | Admitting: Internal Medicine

## 2020-08-19 ENCOUNTER — Telehealth: Payer: Self-pay

## 2020-08-19 DIAGNOSIS — E785 Hyperlipidemia, unspecified: Secondary | ICD-10-CM

## 2020-10-07 NOTE — Telephone Encounter (Signed)
error 

## 2020-10-20 ENCOUNTER — Encounter: Payer: Self-pay | Admitting: Internal Medicine

## 2020-10-20 ENCOUNTER — Ambulatory Visit (INDEPENDENT_AMBULATORY_CARE_PROVIDER_SITE_OTHER): Payer: Medicare Other

## 2020-10-20 ENCOUNTER — Ambulatory Visit: Payer: Medicare Other | Admitting: Internal Medicine

## 2020-10-20 ENCOUNTER — Ambulatory Visit (INDEPENDENT_AMBULATORY_CARE_PROVIDER_SITE_OTHER): Payer: Medicare Other | Admitting: Internal Medicine

## 2020-10-20 ENCOUNTER — Other Ambulatory Visit: Payer: Self-pay

## 2020-10-20 VITALS — BP 120/60 | HR 69 | Temp 98.3°F | Resp 16 | Ht 65.0 in | Wt 138.0 lb

## 2020-10-20 VITALS — BP 120/60 | HR 69 | Temp 98.3°F | Resp 16 | Ht 65.0 in | Wt 138.8 lb

## 2020-10-20 DIAGNOSIS — N1832 Chronic kidney disease, stage 3b: Secondary | ICD-10-CM

## 2020-10-20 DIAGNOSIS — I10 Essential (primary) hypertension: Secondary | ICD-10-CM

## 2020-10-20 DIAGNOSIS — D693 Immune thrombocytopenic purpura: Secondary | ICD-10-CM

## 2020-10-20 DIAGNOSIS — D513 Other dietary vitamin B12 deficiency anemia: Secondary | ICD-10-CM

## 2020-10-20 DIAGNOSIS — Z0000 Encounter for general adult medical examination without abnormal findings: Secondary | ICD-10-CM | POA: Diagnosis not present

## 2020-10-20 DIAGNOSIS — D500 Iron deficiency anemia secondary to blood loss (chronic): Secondary | ICD-10-CM | POA: Diagnosis not present

## 2020-10-20 DIAGNOSIS — D520 Dietary folate deficiency anemia: Secondary | ICD-10-CM

## 2020-10-20 LAB — CBC WITH DIFFERENTIAL/PLATELET
Basophils Absolute: 0 K/uL (ref 0.0–0.1)
Basophils Relative: 0.8 % (ref 0.0–3.0)
Eosinophils Absolute: 0.3 K/uL (ref 0.0–0.7)
Eosinophils Relative: 5 % (ref 0.0–5.0)
HCT: 38.4 % — ABNORMAL LOW (ref 39.0–52.0)
Hemoglobin: 13.4 g/dL (ref 13.0–17.0)
Lymphocytes Relative: 6.9 % — ABNORMAL LOW (ref 12.0–46.0)
Lymphs Abs: 0.4 K/uL — ABNORMAL LOW (ref 0.7–4.0)
MCHC: 34.9 g/dL (ref 30.0–36.0)
MCV: 93.9 fl (ref 78.0–100.0)
Monocytes Absolute: 0.6 K/uL (ref 0.1–1.0)
Monocytes Relative: 9.8 % (ref 3.0–12.0)
Neutro Abs: 5 K/uL (ref 1.4–7.7)
Neutrophils Relative %: 77.5 % — ABNORMAL HIGH (ref 43.0–77.0)
Platelets: 140 K/uL — ABNORMAL LOW (ref 150.0–400.0)
RBC: 4.09 Mil/uL — ABNORMAL LOW (ref 4.22–5.81)
RDW: 14.3 % (ref 11.5–15.5)
WBC: 6.4 K/uL (ref 4.0–10.5)

## 2020-10-20 LAB — URINALYSIS, ROUTINE W REFLEX MICROSCOPIC
Bilirubin Urine: NEGATIVE
Hgb urine dipstick: NEGATIVE
Leukocytes,Ua: NEGATIVE
Nitrite: NEGATIVE
RBC / HPF: NONE SEEN (ref 0–?)
Specific Gravity, Urine: 1.025 (ref 1.000–1.030)
Total Protein, Urine: NEGATIVE
Urine Glucose: NEGATIVE
Urobilinogen, UA: 1 (ref 0.0–1.0)
pH: 6 (ref 5.0–8.0)

## 2020-10-20 LAB — BASIC METABOLIC PANEL WITH GFR
BUN: 17 mg/dL (ref 6–23)
CO2: 25 meq/L (ref 19–32)
Calcium: 9.2 mg/dL (ref 8.4–10.5)
Chloride: 107 meq/L (ref 96–112)
Creatinine, Ser: 1.37 mg/dL (ref 0.40–1.50)
GFR: 49.88 mL/min — ABNORMAL LOW
Glucose, Bld: 100 mg/dL — ABNORMAL HIGH (ref 70–99)
Potassium: 4.2 meq/L (ref 3.5–5.1)
Sodium: 140 meq/L (ref 135–145)

## 2020-10-20 LAB — FERRITIN: Ferritin: 718.4 ng/mL — ABNORMAL HIGH (ref 22.0–322.0)

## 2020-10-20 LAB — IRON: Iron: 92 ug/dL (ref 42–165)

## 2020-10-20 LAB — FOLATE: Folate: 4.7 ng/mL — ABNORMAL LOW (ref >5.9)

## 2020-10-20 LAB — VITAMIN B12: Vitamin B-12: 530 pg/mL (ref 211–911)

## 2020-10-20 MED ORDER — FOLIC ACID 1 MG PO TABS: 1.0000 mg | ORAL_TABLET | ORAL | 1 refills | Status: DC

## 2020-10-20 NOTE — Progress Notes (Signed)
Subjective:   Bruce Jordan. is a 75 y.o. male who presents for Medicare Annual/Subsequent preventive examination.  Review of Systems     Cardiac Risk Factors include: advanced age (>36men, >82 women);dyslipidemia;family history of premature cardiovascular disease;hypertension;male gender     Objective:    Today's Vitals   10/20/20 1257  BP: 120/60  Pulse: 69  Resp: 16  Temp: 98.3 F (36.8 C)  TempSrc: Temporal  SpO2: 96%  Weight: 138 lb 12.8 oz (63 kg)  Height: 5\' 5"  (1.651 m)  PainSc: 0-No pain   Body mass index is 23.1 kg/m.  Advanced Directives 10/20/2020 05/13/2017  Does Patient Have a Medical Advance Directive? Yes No  Type of Advance Directive Living will;Healthcare Power of Attorney -  Does patient want to make changes to medical advance directive? No - Patient declined -  Copy of Healthcare Power of Attorney in Chart? No - copy requested -    Current Medications (verified) Outpatient Encounter Medications as of 10/20/2020  Medication Sig   albuterol (PROVENTIL HFA;VENTOLIN HFA) 108 (90 Base) MCG/ACT inhaler Inhale 1-2 puffs into the lungs every 6 (six) hours as needed for wheezing or shortness of breath.   Ascorbic Acid (VITAMIN C) 100 MG tablet Take 100 mg by mouth daily.   aspirin EC 81 MG tablet Take 81 mg by mouth daily.   atorvastatin (LIPITOR) 40 MG tablet Take 1 tablet by mouth once daily   Cetirizine HCl (ZYRTEC PO) Take 10 mg by mouth daily.   cholecalciferol (VITAMIN D) 400 units TABS tablet Take 400 Units by mouth daily.   citalopram (CELEXA) 10 MG tablet Take 1 tablet by mouth once daily   clobetasol cream (TEMOVATE) 0.05 % Apply 1 application topically 2 (two) times daily as needed (rash).   diclofenac sodium (VOLTAREN) 1 % GEL Apply 2 g topically 4 (four) times daily as needed (pain).    EPINEPHrine (EPIPEN IJ) Inject as directed as directed.   ferrous sulfate 325 (65 FE) MG tablet Take 325 mg by mouth daily with breakfast.   folic acid  (FOLVITE) 1 MG tablet Take 1 tablet (1 mg total) by mouth every other day.   lisinopril (ZESTRIL) 20 MG tablet Take 1 tablet (20 mg total) by mouth daily.   Misc Natural Products (TART CHERRY ADVANCED PO) Take by mouth.   sildenafil (VIAGRA) 100 MG tablet TAKE 1 TABLET BY MOUTH ONCE DAILY AS NEEDED FOR ERECTILE DYSFUNCTION   TURMERIC PO Take by mouth.   No facility-administered encounter medications on file as of 10/20/2020.    Allergies (verified) Patient has no known allergies.   History: Past Medical History:  Diagnosis Date   Chronic ischemic heart disease    Coronary artery disease    Hyperlipidemia    Hypertension    IFG (impaired fasting glucose)    Osteoarthritis    Past Surgical History:  Procedure Laterality Date   CARDIAC CATHETERIZATION     Family History  Problem Relation Age of Onset   Hypertension Mother    Heart disease Mother    Heart disease Father    Cancer Father    Early death Brother 16       MVA   Stroke Brother    Early death Brother 35       Epilepsy   Social History   Socioeconomic History   Marital status: Married    Spouse name: Not on file   Number of children: Not on file   Years of education:  Not on file   Highest education level: Not on file  Occupational History   Not on file  Tobacco Use   Smoking status: Former    Pack years: 0.00   Smokeless tobacco: Never  Vaping Use   Vaping Use: Never used  Substance and Sexual Activity   Alcohol use: No   Drug use: No   Sexual activity: Yes    Birth control/protection: None  Other Topics Concern   Not on file  Social History Narrative   Not on file   Social Determinants of Health   Financial Resource Strain: Low Risk    Difficulty of Paying Living Expenses: Not hard at all  Food Insecurity: No Food Insecurity   Worried About Programme researcher, broadcasting/film/video in the Last Year: Never true   Ran Out of Food in the Last Year: Never true  Transportation Needs: No Transportation Needs   Lack  of Transportation (Medical): No   Lack of Transportation (Non-Medical): No  Physical Activity: Sufficiently Active   Days of Exercise per Week: 5 days   Minutes of Exercise per Session: 30 min  Stress: No Stress Concern Present   Feeling of Stress : Not at all  Social Connections: Socially Integrated   Frequency of Communication with Friends and Family: More than three times a week   Frequency of Social Gatherings with Friends and Family: Three times a week   Attends Religious Services: More than 4 times per year   Active Member of Clubs or Organizations: No   Attends Engineer, structural: More than 4 times per year   Marital Status: Married    Tobacco Counseling Counseling given: Not Answered   Clinical Intake:  Pre-visit preparation completed: Yes  Pain : No/denies pain Pain Score: 0-No pain     BMI - recorded: 23.1 Nutritional Status: BMI of 19-24  Normal Nutritional Risks: None Diabetes: No  How often do you need to have someone help you when you read instructions, pamphlets, or other written materials from your doctor or pharmacy?: 1 - Never What is the last grade level you completed in school?: High School Graduate  Diabetic? no  Interpreter Needed?: No  Information entered by :: Susie Cassette, LPN   Activities of Daily Living In your present state of health, do you have any difficulty performing the following activities: 10/20/2020 02/24/2020  Hearing? Malvin Johns  Vision? N N  Difficulty concentrating or making decisions? Y N  Walking or climbing stairs? N N  Dressing or bathing? N N  Doing errands, shopping? Y N  Comment due to hearing -  Quarry manager and eating ? N -  Using the Toilet? N -  In the past six months, have you accidently leaked urine? N -  Do you have problems with loss of bowel control? N -  Managing your Medications? N -  Managing your Finances? N -  Housekeeping or managing your Housekeeping? N -  Some recent data might be  hidden    Patient Care Team: Etta Grandchild, MD as PCP - General (Internal Medicine) Sinda Du, MD as Consulting Physician (Ophthalmology)  Indicate any recent Medical Services you may have received from other than [facility] providers in the past year (date may be approximate).     Assessment:   This is a routine wellness examination for [place].  Hearing/Vision screen Hearing Screening - Comments:: Patient has decreased bilateral hearing.  Patient has hearing aids.   Vision Screening - Comments:: Patient wears glasses  and gets an eye exam one a year by Dr. Sinda DuBradley Bowen.  Dietary issues and exercise activities discussed: Current Exercise Habits: Home exercise routine, Type of exercise: walking, Time (Minutes): 30, Frequency (Times/Week): 5, Weekly Exercise (Minutes/Week): 150, Intensity: Moderate, Exercise limited by: None identified   Goals Addressed               This Visit's Progress     Patient Stated (pt-stated)        Client understands the importance of follow-up with providers by attending scheduled visits and discussed goals to eat healthier, increase physical activity, exercise the brain, socialize more,  get enough rest and make time for laughter.       Depression Screen PHQ 2/9 Scores 10/20/2020 02/24/2020 11/12/2019 07/16/2019 12/04/2018 04/12/2018 12/26/2016  PHQ - 2 Score 0 0 0 0 0 0 0  PHQ- 9 Score - 0 0 0 1 - -    Fall Risk Fall Risk  10/20/2020 11/12/2019 07/16/2019 07/16/2019 12/04/2018  Falls in the past year? 0 0 0 0 0  Number falls in past yr: 0 0 0 0 0  Injury with Fall? 0 0 0 0 0  Risk for fall due to : No Fall Risks No Fall Risks No Fall Risks No Fall Risks -  Follow up Falls evaluation completed Falls evaluation completed Falls evaluation completed Falls evaluation completed Falls evaluation completed    FALL RISK PREVENTION PERTAINING TO THE HOME:  Any stairs in or around the home? Yes  If so, are there any without handrails? No  Home free of loose throw  rugs in walkways, pet beds, electrical cords, etc? Yes  Adequate lighting in your home to reduce risk of falls? Yes   ASSISTIVE DEVICES UTILIZED TO PREVENT FALLS:  Life alert? No  Use of a cane, walker or w/c? No  Grab bars in the bathroom? Yes  Shower chair or bench in shower? No  Elevated toilet seat or a handicapped toilet? No   TIMED UP AND GO:  Was the test performed? Yes .  Length of time to ambulate 10 feet: 8 sec.   Gait steady and fast without use of assistive device  Cognitive Function: Normal cognitive status assessed by direct observation by this Nurse Health Advisor. No abnormalities found.    MMSE - Mini Mental State Exam 06/21/2018  Orientation to time 2  Orientation to Place 3  Registration 3  Attention/ Calculation 1  Recall 0  Language- name 2 objects 2  Language- repeat 1  Language- follow 3 step command 3  Language- read & follow direction 1  Write a sentence 1  Copy design 1  Total score 18        Immunizations Immunization History  Administered Date(s) Administered   Fluad Quad(high Dose 65+) 01/06/2019, 01/20/2020   Influenza Split 02/06/2014, 01/20/2020   Influenza, High Dose Seasonal PF 01/05/2015, 03/11/2016, 12/26/2016, 02/12/2018   Moderna Sars-Covid-2 Vaccination 02/10/2020   PFIZER(Purple Top)SARS-COV-2 Vaccination 04/29/2019, 05/13/2019   Pneumococcal Conjugate-13 12/02/2012   Pneumococcal Polysaccharide-23 05/25/2016   Tdap 12/26/2016    TDAP status: Up to date  Flu Vaccine status: Up to date  Pneumococcal vaccine status: Up to date  Covid-19 vaccine status: Completed vaccines  Qualifies for Shingles Vaccine? Yes   Zostavax completed No   Shingrix Completed?: No.    Education has been provided regarding the importance of this vaccine. Patient has been advised to call insurance company to determine out of pocket expense if they have not  yet received this vaccine. Advised may also receive vaccine at local pharmacy or Health  Dept. Verbalized acceptance and understanding.  Screening Tests Health Maintenance  Topic Date Due   Zoster Vaccines- Shingrix (1 of 2) Never done   COVID-19 Vaccine (4 - Booster) 06/09/2020   INFLUENZA VACCINE  11/09/2020   TETANUS/TDAP  12/27/2026   Hepatitis C Screening  Completed   PNA vac Low Risk Adult  Completed   HPV VACCINES  Aged Out    Health Maintenance  Health Maintenance Due  Topic Date Due   Zoster Vaccines- Shingrix (1 of 2) Never done   COVID-19 Vaccine (4 - Booster) 06/09/2020    Colorectal cancer screening: No longer required.   Lung Cancer Screening: (Low Dose CT Chest recommended if Age 63-80 years, 30 pack-year currently smoking OR have quit w/in 15years.) does not qualify.   Lung Cancer Screening Referral: no  Additional Screening:  Hepatitis C Screening: does qualify; Completed yes  Vision Screening: Recommended annual ophthalmology exams for early detection of glaucoma and other disorders of the eye. Is the patient up to date with their annual eye exam?  Yes  Who is the provider or what is the name of the office in which the patient attends annual eye exams? Sinda Du, MD If pt is not established with a provider, would they like to be referred to a provider to establish care? No .   Dental Screening: Recommended annual dental exams for proper oral hygiene  Community Resource Referral / Chronic Care Management: CRR required this visit?  No   CCM required this visit?  No      Plan:     I have personally reviewed and noted the following in the patient's chart:   Medical and social history Use of alcohol, tobacco or illicit drugs  Current medications and supplements including opioid prescriptions. Patient is not currently taking opioid prescriptions. Functional ability and status Nutritional status Physical activity Advanced directives List of other physicians Hospitalizations, surgeries, and ER visits in previous 12  months Vitals Screenings to include cognitive, depression, and falls Referrals and appointments  In addition, I have reviewed and discussed with patient certain preventive protocols, quality metrics, and best practice recommendations. A written personalized care plan for preventive services as well as general preventive health recommendations were provided to patient.     Mickeal Needy, LPN   8/41/3244   Nurse Notes: n/a

## 2020-10-20 NOTE — Progress Notes (Addendum)
Subjective:  Patient ID: Bruce Ardis., male    DOB: [birth year]  Age: 75 y.o. MRN: [id number]  CC: Anemia  This visit occurred during the SARS-CoV-2 public health emergency.  Safety protocols were in place, including screening questions prior to the visit, additional usage of staff PPE, and extensive cleaning of exam room while observing appropriate contact time as indicated for disinfecting solutions.    HPI Bruce Marcello. presents for f/up -   He is not taking a folate supplement.  He is active and denies any recent episodes of chest pain, shortness of breath, palpitations, dizziness, or lightheadedness.  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  He denies bleeding.  He has a history of word finding difficulty and would like to see a neurologist again.  Outpatient Medications Prior to Visit  Medication Sig Dispense Refill   albuterol (PROVENTIL HFA;VENTOLIN HFA) 108 (90 Base) MCG/ACT inhaler Inhale 1-2 puffs into the lungs every 6 (six) hours as needed for wheezing or shortness of breath. 18 g 3   Ascorbic Acid (VITAMIN C) 100 MG tablet Take 100 mg by mouth daily.     aspirin EC 81 MG tablet Take 81 mg by mouth daily.     atorvastatin (LIPITOR) 40 MG tablet Take 1 tablet by mouth once daily 90 tablet 0   Cetirizine HCl (ZYRTEC PO) Take 10 mg by mouth daily.     cholecalciferol (VITAMIN D) 400 units TABS tablet Take 400 Units by mouth daily.     citalopram (CELEXA) 10 MG tablet Take 1 tablet by mouth once daily 90 tablet 0   clobetasol cream (TEMOVATE) 0.05 % Apply 1 application topically 2 (two) times daily as needed (rash). 60 g 3   diclofenac sodium (VOLTAREN) 1 % GEL Apply 2 g topically 4 (four) times daily as needed (pain).      EPINEPHrine (EPIPEN IJ) Inject as directed as directed.     ferrous sulfate 325 (65 FE) MG tablet Take 325 mg by mouth daily with breakfast.     lisinopril (ZESTRIL) 20 MG tablet Take 1 tablet (20 mg total) by mouth daily. 90 tablet 1    Misc Natural Products (TART CHERRY ADVANCED PO) Take by mouth.     sildenafil (VIAGRA) 100 MG tablet TAKE 1 TABLET BY MOUTH ONCE DAILY AS NEEDED FOR ERECTILE DYSFUNCTION 5 tablet 1   TURMERIC PO Take by mouth.     folic acid (FOLVITE) 1 MG tablet Take 1 tablet (1 mg total) by mouth every other day. 45 tablet 1   No facility-administered medications prior to visit.    ROS Review of Systems  Constitutional:  Negative for appetite change, chills, diaphoresis, fatigue and fever.  HENT: Negative.    Eyes: Negative.   Respiratory:  Negative for cough, chest tightness, shortness of breath and wheezing.   Cardiovascular:  Negative for chest pain, palpitations and leg swelling.  Gastrointestinal:  Negative for abdominal pain, constipation, diarrhea, nausea and vomiting.  Endocrine: Negative.   Genitourinary: Negative.  Negative for difficulty urinating, dysuria and hematuria.  Musculoskeletal:  Positive for arthralgias.  Neurological: Negative.  Negative for dizziness, weakness and light-headedness.  Hematological:  Negative for adenopathy. Bruises/bleeds easily.  Psychiatric/Behavioral: Negative.     Objective:  BP 120/60   Pulse 69   Temp 98.3 F (36.8 C)   Resp 16   Ht 5\' 5"  (1.651 m)   Wt 138 lb (62.6 kg)   SpO2 96%   BMI 22.96 kg/m  BP Readings from Last 3 Encounters:  10/20/20 120/60  10/20/20 120/60  02/24/20 (!) 144/82    Wt Readings from Last 3 Encounters:  10/20/20 138 lb (62.6 kg)  10/20/20 138 lb 12.8 oz (63 kg)  02/24/20 140 lb 6.4 oz (63.7 kg)    Physical Exam Vitals reviewed.  Constitutional:      Appearance: Normal appearance.  HENT:     Nose: Nose normal.     Mouth/Throat:     Mouth: Mucous membranes are moist.  Eyes:     Conjunctiva/sclera: Conjunctivae normal.  Cardiovascular:     Rate and Rhythm: Normal rate and regular rhythm.     Heart sounds: No murmur heard. Pulmonary:     Effort: Pulmonary effort is normal.     Breath sounds: No  stridor. No wheezing, rhonchi or rales.  Abdominal:     General: Abdomen is flat. Bowel sounds are normal. There is no distension.     Palpations: Abdomen is soft. There is no hepatomegaly, splenomegaly or mass.     Tenderness: There is no abdominal tenderness.  Musculoskeletal:        General: Normal range of motion.     Cervical back: Neck supple.     Right lower leg: No edema.     Left lower leg: No edema.  Lymphadenopathy:     Cervical: No cervical adenopathy.  Skin:    General: Skin is warm and dry.     Coloration: Skin is not jaundiced or pale.     Findings: Bruising present.     Comments: SP on both forearms  Neurological:     General: No focal deficit present.     Mental Status: He is alert.  Psychiatric:        Mood and Affect: Mood normal.        Behavior: Behavior normal.    Lab Results  Component Value Date   WBC 6.4 10/20/2020   HGB 13.4 10/20/2020   HCT 38.4 (L) 10/20/2020   PLT 140.0 (L) 10/20/2020   GLUCOSE 100 (H) 10/20/2020   CHOL 153 11/12/2019   TRIG 141 11/12/2019   HDL 48 11/12/2019   LDLDIRECT 77.0 10/04/2017   LDLCALC 81 11/12/2019   ALT 17 11/12/2019   AST 21 11/12/2019   NA 140 10/20/2020   K 4.2 10/20/2020   CL 107 10/20/2020   CREATININE 1.37 10/20/2020   BUN 17 10/20/2020   CO2 25 10/20/2020   TSH 3.57 11/12/2019   PSA 3.49 12/04/2018   HGBA1C 5.4 11/12/2019    DG Chest 2 View  Result Date: 09/12/2017 CLINICAL DATA:  Cough, congestion, tightness on chest x 5 wks, HTN, hx of cardiac cath. EXAM: CHEST - 2 VIEW COMPARISON:  None. FINDINGS: Heart size is upper normal. 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 Lungs are clear. No pleural effusion or pneumothorax seen. 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 No acute or suspicious osseous finding. IMPRESSION: No active cardiopulmonary disease. No evidence of pneumonia or pulmonary edema. Electronically Signed   By: Bary Richard  M.D.   On: 09/12/2017 10:16    Assessment & Plan:   Bruce Jordan was seen today for anemia.  Diagnoses and all orders for this visit:  Chronic ITP (idiopathic thrombocytopenia) (HCC)- His PLT count has improved. -     CBC with Differential/Platelet; Future -     CBC with Differential/Platelet  Dietary folate deficiency anemia- He is mildly anemic and his folate  level is low. Will restart folate. -     CBC with Differential/Platelet; Future -     Vitamin B12; Future -     Folate; Future -     Folate -     Vitamin B12 -     CBC with Differential/Platelet -     folic acid (FOLVITE) 1 MG tablet; Take 1 tablet (1 mg total) by mouth every other day.  Other dietary vitamin B12 deficiency anemia- Will monitor his B12 level -     CBC with Differential/Platelet; Future -     Vitamin B12; Future -     Folate; Future -     Folate -     Vitamin B12 -     CBC with Differential/Platelet  Iron deficiency anemia due to chronic blood loss- Iron level is normal. -     CBC with Differential/Platelet; Future -     Iron; Future -     Ferritin; Future -     Ferritin -     Iron -     CBC with Differential/Platelet  Stage 3b chronic kidney disease (HCC)- His renal fxn is stable. BP is well controlled. -     Basic metabolic panel; Future -     Urinalysis, Routine w reflex microscopic; Future -     Urinalysis, Routine w reflex microscopic -     Basic metabolic panel  Essential hypertension- His BP is well controlled. -     Urinalysis, Routine w reflex microscopic; Future -     Urinalysis, Routine w reflex microscopic  I am having Bruce Castilla [name] "[name]" maintain his aspirin EC, cholecalciferol, diclofenac sodium, ferrous sulfate, vitamin C, albuterol, EPINEPHrine (EPIPEN IJ), Cetirizine HCl (ZYRTEC PO), Misc Natural Products (TART CHERRY ADVANCED PO), TURMERIC PO, sildenafil, clobetasol cream, lisinopril, citalopram, atorvastatin, and folic acid.  Meds ordered this encounter  Medications    folic acid (FOLVITE) 1 MG tablet    Sig: Take 1 tablet (1 mg total) by mouth every other day.    Dispense:  45 tablet    Refill:  1      Follow-up: Return in about 6 months (around 04/22/2021).  Sanda Linger, MD

## 2020-10-20 NOTE — Patient Instructions (Addendum)
Bruce Jordan , Thank you for taking time to come for your Medicare Wellness Visit. I appreciate your ongoing commitment to your health goals. Please review the following plan we discussed and let me know if I can assist you in the future.   Screening recommendations/referrals: Colonoscopy: last done 10/30/2013; no repeat due to age Recommended yearly ophthalmology/optometry visit for glaucoma screening and checkup Recommended yearly dental visit for hygiene and checkup  Vaccinations: Influenza vaccine: 01/20/2020 Pneumococcal vaccine: 12/02/2012, 05/25/2016 Tdap vaccine: 12/26/2016; due every 10 years Shingles vaccine: never done; can check with local pharmacy    Covid-19: 04/29/2019, 05/13/2019, 02/10/2020  Advanced directives: Please bring a copy of your health care power of attorney and living will to the office at your convenience.  Conditions/risks identified: Client understands the importance of follow-up with providers by attending scheduled visits and discussed goals to eat healthier, increase physical activity, exercise the brain, socialize more,  get enough rest and make time for laughter.  Next appointment: Please schedule your next Medicare Wellness Visit with your Nurse Health Advisor in 1 year by calling [phone].  Preventive Care 36 Years and Older, Male Preventive care refers to lifestyle choices and visits with your health care provider that can promote health and wellness. What does preventive care include? A yearly physical exam. This is also called an annual well check. Dental exams once or twice a year. Routine eye exams. Ask your health care provider how often you should have your eyes checked. Personal lifestyle choices, including: Daily care of your teeth and gums. Regular physical activity. Eating a healthy diet. Avoiding tobacco and drug use. Limiting alcohol use. Practicing safe sex. Taking low doses of aspirin every day. Taking vitamin and mineral supplements  as recommended by your health care provider. What happens during an annual well check? The services and screenings done by your health care provider during your annual well check will depend on your age, overall health, lifestyle risk factors, and family history of disease. Counseling  Your health care provider may ask you questions about your: Alcohol use. Tobacco use. Drug use. Emotional well-being. Home and relationship well-being. Sexual activity. Eating habits. History of falls. Memory and ability to understand (cognition). Work and work Astronomer. Screening  You may have the following tests or measurements: Height, weight, and BMI. Blood pressure. Lipid and cholesterol levels. These may be checked every 5 years, or more frequently if you are over 56 years old. Skin check. Lung cancer screening. You may have this screening every year starting at age 20 if you have a 30-pack-year history of smoking and currently smoke or have quit within the past 15 years. Fecal occult blood test (FOBT) of the stool. You may have this test every year starting at age 41. Flexible sigmoidoscopy or colonoscopy. You may have a sigmoidoscopy every 5 years or a colonoscopy every 10 years starting at age 20. Prostate cancer screening. Recommendations will vary depending on your family history and other risks. Hepatitis C blood test. Hepatitis B blood test. Sexually transmitted disease (STD) testing. Diabetes screening. This is done by checking your blood sugar (glucose) after you have not eaten for a while (fasting). You may have this done every 1-3 years. Abdominal aortic aneurysm (AAA) screening. You may need this if you are a current or former smoker. Osteoporosis. You may be screened starting at age 12 if you are at high risk. Talk with your health care provider about your test results, treatment options, and if necessary, the need for more  tests. Vaccines  Your health care provider may recommend  certain vaccines, such as: Influenza vaccine. This is recommended every year. Tetanus, diphtheria, and acellular pertussis (Tdap, Td) vaccine. You may need a Td booster every 10 years. Zoster vaccine. You may need this after age 60. Pneumococcal 13-valent conjugate (PCV13) vaccine. One dose is recommended after age 62. Pneumococcal polysaccharide (PPSV23) vaccine. One dose is recommended after age 91. Talk to your health care provider about which screenings and vaccines you need and how often you need them. This information is not intended to replace advice given to you by your health care provider. Make sure you discuss any questions you have with your health care provider. Document Released: 04/24/2015 Document Revised: 12/16/2015 Document Reviewed: 01/27/2015 Elsevier Interactive Patient Education  2017 [employer].  Fall Prevention in the Home Falls can cause injuries. They can happen to people of all ages. There are many things you can do to make your home safe and to help prevent falls. What can I do on the outside of my home? Regularly fix the edges of walkways and driveways and fix any cracks. Remove anything that might make you trip as you walk through a door, such as a raised step or threshold. Trim any bushes or trees on the path to your home. Use bright outdoor lighting. Clear any walking paths of anything that might make someone trip, such as rocks or tools. Regularly check to see if handrails are loose or broken. Make sure that both sides of any steps have handrails. Any raised decks and porches should have guardrails on the edges. Have any leaves, snow, or ice cleared regularly. Use sand or salt on walking paths during winter. Clean up any spills in your garage right away. This includes oil or grease spills. What can I do in the bathroom? Use night lights. Install grab bars by the toilet and in the tub and shower. Do not use towel bars as grab bars. Use non-skid mats or  decals in the tub or shower. If you need to sit down in the shower, use a plastic, non-slip stool. Keep the floor dry. Clean up any water that spills on the floor as soon as it happens. Remove soap buildup in the tub or shower regularly. Attach bath mats securely with double-sided non-slip rug tape. Do not have throw rugs and other things on the floor that can make you trip. What can I do in the bedroom? Use night lights. Make sure that you have a light by your bed that is easy to reach. Do not use any sheets or blankets that are too big for your bed. They should not hang down onto the floor. Have a firm chair that has side arms. You can use this for support while you get dressed. Do not have throw rugs and other things on the floor that can make you trip. What can I do in the kitchen? Clean up any spills right away. Avoid walking on wet floors. Keep items that you use a lot in easy-to-reach places. If you need to reach something above you, use a strong step stool that has a grab bar. Keep electrical cords out of the way. Do not use floor polish or wax that makes floors slippery. If you must use wax, use non-skid floor wax. Do not have throw rugs and other things on the floor that can make you trip. What can I do with my stairs? Do not leave any items on the stairs. Make sure  that there are handrails on both sides of the stairs and use them. Fix handrails that are broken or loose. Make sure that handrails are as long as the stairways. Check any carpeting to make sure that it is firmly attached to the stairs. Fix any carpet that is loose or worn. Avoid having throw rugs at the top or bottom of the stairs. If you do have throw rugs, attach them to the floor with carpet tape. Make sure that you have a light switch at the top of the stairs and the bottom of the stairs. If you do not have them, ask someone to add them for you. What else can I do to help prevent falls? Wear shoes that: Do not  have high heels. Have rubber bottoms. Are comfortable and fit you well. Are closed at the toe. Do not wear sandals. If you use a stepladder: Make sure that it is fully opened. Do not climb a closed stepladder. Make sure that both sides of the stepladder are locked into place. Ask someone to hold it for you, if possible. Clearly mark and make sure that you can see: Any grab bars or handrails. First and last steps. Where the edge of each step is. Use tools that help you move around (mobility aids) if they are needed. These include: Canes. Walkers. Scooters. Crutches. Turn on the lights when you go into a dark area. Replace any light bulbs as soon as they burn out. Set up your furniture so you have a clear path. Avoid moving your furniture around. If any of your floors are uneven, fix them. If there are any pets around you, be aware of where they are. Review your medicines with your doctor. Some medicines can make you feel dizzy. This can increase your chance of falling. Ask your doctor what other things that you can do to help prevent falls. This information is not intended to replace advice given to you by your health care provider. Make sure you discuss any questions you have with your health care provider. Document Released: 01/22/2009 Document Revised: 09/03/2015 Document Reviewed: 05/02/2014 Elsevier Interactive Patient Education  2017 [employer].

## 2020-10-20 NOTE — Progress Notes (Deleted)
Subjective:  Patient ID: Bruce Jordan., male    DOB: July 12, 1945  Age: 75 y.o. MRN: [id number]  CC: No chief complaint on file.   HPI Bruce Mo. presents for ***  Outpatient Medications Prior to Visit  Medication Sig Dispense Refill   albuterol (PROVENTIL HFA;VENTOLIN HFA) 108 (90 Base) MCG/ACT inhaler Inhale 1-2 puffs into the lungs every 6 (six) hours as needed for wheezing or shortness of breath. 18 g 3   Ascorbic Acid (VITAMIN C) 100 MG tablet Take 100 mg by mouth daily.     aspirin EC 81 MG tablet Take 81 mg by mouth daily.     atorvastatin (LIPITOR) 40 MG tablet Take 1 tablet by mouth once daily 90 tablet 0   Cetirizine HCl (ZYRTEC PO) Take 10 mg by mouth daily.     cholecalciferol (VITAMIN D) 400 units TABS tablet Take 400 Units by mouth daily.     citalopram (CELEXA) 10 MG tablet Take 1 tablet by mouth once daily 90 tablet 0   clobetasol cream (TEMOVATE) 0.05 % Apply 1 application topically 2 (two) times daily as needed (rash). 60 g 3   diclofenac sodium (VOLTAREN) 1 % GEL Apply 2 g topically 4 (four) times daily as needed (pain).      EPINEPHrine (EPIPEN IJ) Inject as directed as directed.     ferrous sulfate 325 (65 FE) MG tablet Take 325 mg by mouth daily with breakfast.     lisinopril (ZESTRIL) 20 MG tablet Take 1 tablet (20 mg total) by mouth daily. 90 tablet 1   Misc Natural Products (TART CHERRY ADVANCED PO) Take by mouth.     sildenafil (VIAGRA) 100 MG tablet TAKE 1 TABLET BY MOUTH ONCE DAILY AS NEEDED FOR ERECTILE DYSFUNCTION 5 tablet 1   TURMERIC PO Take by mouth.     folic acid (FOLVITE) 1 MG tablet Take 1 tablet (1 mg total) by mouth every other day. 45 tablet 1   No facility-administered medications prior to visit.    ROS Review of Systems  Objective:  There were no vitals taken for this visit.  BP Readings from Last 3 Encounters:  10/20/20 120/60  10/20/20 120/60  02/24/20 (!) 144/82    Wt Readings from Last 3 Encounters:  10/20/20 138  lb (62.6 kg)  10/20/20 138 lb 12.8 oz (63 kg)  02/24/20 140 lb 6.4 oz (63.7 kg)    Physical Exam  Lab Results  Component Value Date   WBC 6.4 10/20/2020   HGB 13.4 10/20/2020   HCT 38.4 (L) 10/20/2020   PLT 140.0 (L) 10/20/2020   GLUCOSE 100 (H) 10/20/2020   CHOL 153 11/12/2019   TRIG 141 11/12/2019   HDL 48 11/12/2019   LDLDIRECT 77.0 10/04/2017   LDLCALC 81 11/12/2019   ALT 17 11/12/2019   AST 21 11/12/2019   NA 140 10/20/2020   K 4.2 10/20/2020   CL 107 10/20/2020   CREATININE 1.37 10/20/2020   BUN 17 10/20/2020   CO2 25 10/20/2020   TSH 3.57 11/12/2019   PSA 3.49 12/04/2018   HGBA1C 5.4 11/12/2019    DG Chest 2 View  Result Date: 09/12/2017 CLINICAL DATA:  Cough, congestion, tightness on chest x 5 wks, HTN, hx of cardiac cath. EXAM: CHEST - 2 VIEW COMPARISON:  None. FINDINGS: Heart size is upper normal. 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 Lungs are clear. No pleural effusion or pneumothorax seen. 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 No acute or suspicious osseous finding. IMPRESSION: No active cardiopulmonary disease. No evidence of pneumonia or pulmonary edema. Electronically Signed   By: Bary Richard M.D.   On: 09/12/2017 10:16    Assessment & Plan:   Diagnoses and all orders for this visit:  Essential hypertension  Stage 3b chronic kidney disease (HCC)  Dietary folate deficiency anemia -     folic acid (FOLVITE) 1 MG tablet; Take 1 tablet (1 mg total) by mouth every other day.  Other dietary vitamin B12 deficiency anemia  Iron deficiency anemia due to chronic blood loss  I am having Bruce Castilla [name] "Bruce Jordan" maintain his aspirin EC, cholecalciferol, diclofenac sodium, ferrous sulfate, vitamin C, albuterol, EPINEPHrine (EPIPEN IJ), Cetirizine HCl (ZYRTEC PO), Misc Natural Products (TART CHERRY ADVANCED PO), TURMERIC PO, sildenafil, clobetasol cream, lisinopril, citalopram, atorvastatin,  and folic acid.  Meds ordered this encounter  Medications   folic acid (FOLVITE) 1 MG tablet    Sig: Take 1 tablet (1 mg total) by mouth every other day.    Dispense:  45 tablet    Refill:  1      Follow-up: No follow-ups on file.  Sanda Linger, MD

## 2020-10-20 NOTE — Patient Instructions (Signed)
Idiopathic Thrombocytopenic Purpura Idiopathic thrombocytopenic purpura (ITP) is a disease in which the body's disease-fighting system (immune system) attacks platelets in the body. Platelets are blood cells that clump togetherto form clots. Blood clots help stop bleeding in the body. A person with ITP has too few platelets. As a result, it is harder for the blood to clot. A person may bruise and bleed easily, such as bleeding a lot from minor cuts and scrapes. ITP can affect both children and adults. It is usually a short-term (acute) condition in children and a long-term (chronic) condition in adults. What are the causes? The cause of ITP is not known. In some cases, this condition may develop: After a viral infection. During pregnancy. After developing an immune system disorder. What increases the risk? You may be more likely to develop this condition if you: Are male. Are 20-50 years old. What are the signs or symptoms? If you have a mild case, you may not have any symptoms. In more serious cases, symptoms may include: Bruising easily. Minor injuries, like cuts and scrapes, that bleed for a long time. Small red or purple dots under your skin (petechiae), especially on your shins. Blood in the urine or stool (feces). Nosebleeds. Bleeding gums. Heavy menstrual periods in women. How is this diagnosed? This condition may be diagnosed based on: Your symptoms and medical history. A physical exam. Blood tests. Tests of the spongy tissue inside your bones (bone marrow). How is this treated? Treatment depends on how severe your condition is. Treatment may include: Monitoring your symptoms and your platelet count over time. You may need to see your health care provider for blood tests on a regular basis. Receiving donated blood products (transfusions), such as platelets. Medicines to: Reduce inflammation (steroids). Increase how many platelets your body makes. Reduce the activity of  your immune system. Surgery to remove your spleen, if other treatments are not effective. The spleen is an organ in your upper left abdomen. It stores blood cells and is involved in some immune system functions. In ITP, the spleen releases proteins (antibodies) that mistakenly attack platelets. Follow these instructions at home: Medicines Take over-the-counter and prescription medicines only as told by your health care provider. Do not take the following unless your health care provider approves: Over-the-counter medicines that contain aspirin. NSAIDs such as ibuprofen and naproxen. Talk with your health care provider before you take any new medicines. Certain medicines may increase your risk for dangerous bleeding. Preventing falls  Follow instructions from your health care provider about ways that you can help prevent falls and injuries at home. These may include: Removing loose rugs, cords, and other tripping hazards from walkways. Installing grab bars in bathrooms. Using night-lights.  General instructions Tell all your health care providers, including your dentist, that you have a bleeding disorder. Make sure to tell providers before you have any procedure done, including dental cleanings. Do not play contact sports or do activities that have a high risk for injury or bruising. Ask your health care provider what activities are safe for you. Brush your teeth using a soft toothbrush. When shaving, use an electric razor instead of a blade. Wear a medical alert bracelet that says that you have a bleeding disorder. This can help you get the treatment you need in case of emergency. Keep all follow-up visits as told by your health care provider. This is important. You may need regular blood tests. Contact a health care provider if you have: New symptoms. Symptoms that get   worse. A fever. Get help right away if you have: A sudden, severe headache. Sudden, severe nausea. Severe  bleeding. Vomiting. Summary Idiopathic thrombocytopenic purpura (ITP) is a disease in which the body's disease-fighting system (immune system) attacks blood cells that form clots to help stop bleeding in the body (platelets). ITP can lead to bruising and bleeding easily, including frequent nosebleeds, blood in the urine or stool, and bleeding gums. In women, ITP can lead to heavy menstrual periods. Treatment depends on how severe your condition is. It may include steroid therapy and medicines that reduce the activity of the immune system. In some cases, surgery is needed to remove the spleen. Follow your health care provider's instructions about taking medicines, preventing falls, restricting some activities, and when to get help. This information is not intended to replace advice given to you by your health care provider. Make sure you discuss any questions you have with your healthcare provider. Document Revised: 09/06/2019 Document Reviewed: 09/06/2019 Elsevier Patient Education  2022 Else[employer] Inc[employer]  

## 2020-10-21 ENCOUNTER — Other Ambulatory Visit: Payer: Self-pay | Admitting: Internal Medicine

## 2020-10-21 DIAGNOSIS — R4789 Other speech disturbances: Secondary | ICD-10-CM

## 2020-10-21 HISTORY — DX: Other speech disturbances: R47.89

## 2020-10-21 MED ORDER — FOLIC ACID 1 MG PO TABS: 1.0000 mg | ORAL_TABLET | ORAL | 1 refills | Status: DC

## 2020-10-21 NOTE — Progress Notes (Signed)
Subjective:  Patient ID: Bruce Serfass., male    DOB: 23-Dec-1945  Age: 75 y.o. MRN: [id number]  CC: No chief complaint on file.   HPI [name] presents for duplicate  Outpatient Medications Prior to Visit  Medication Sig Dispense Refill   albuterol (PROVENTIL HFA;VENTOLIN HFA) 108 (90 Base) MCG/ACT inhaler Inhale 1-2 puffs into the lungs every 6 (six) hours as needed for wheezing or shortness of breath. 18 g 3   Ascorbic Acid (VITAMIN C) 100 MG tablet Take 100 mg by mouth daily.     aspirin EC 81 MG tablet Take 81 mg by mouth daily.     atorvastatin (LIPITOR) 40 MG tablet Take 1 tablet by mouth once daily 90 tablet 0   Cetirizine HCl (ZYRTEC PO) Take 10 mg by mouth daily.     cholecalciferol (VITAMIN D) 400 units TABS tablet Take 400 Units by mouth daily.     citalopram (CELEXA) 10 MG tablet Take 1 tablet by mouth once daily 90 tablet 0   clobetasol cream (TEMOVATE) 0.05 % Apply 1 application topically 2 (two) times daily as needed (rash). 60 g 3   diclofenac sodium (VOLTAREN) 1 % GEL Apply 2 g topically 4 (four) times daily as needed (pain).      EPINEPHrine (EPIPEN IJ) Inject as directed as directed.     ferrous sulfate 325 (65 FE) MG tablet Take 325 mg by mouth daily with breakfast.     lisinopril (ZESTRIL) 20 MG tablet Take 1 tablet (20 mg total) by mouth daily. 90 tablet 1   Misc Natural Products (TART CHERRY ADVANCED PO) Take by mouth.     sildenafil (VIAGRA) 100 MG tablet TAKE 1 TABLET BY MOUTH ONCE DAILY AS NEEDED FOR ERECTILE DYSFUNCTION 5 tablet 1   TURMERIC PO Take by mouth.     folic acid (FOLVITE) 1 MG tablet Take 1 tablet (1 mg total) by mouth every other day. 45 tablet 1   No facility-administered medications prior to visit.    ROS Review of Systems  Objective:  There were no vitals taken for this visit.  BP Readings from Last 3 Encounters:  10/20/20 120/60  10/20/20 120/60  02/24/20 (!) 144/82    Wt Readings from Last 3 Encounters:   10/20/20 138 lb (62.6 kg)  10/20/20 138 lb 12.8 oz (63 kg)  02/24/20 140 lb 6.4 oz (63.7 kg)    Physical Exam  Lab Results  Component Value Date   WBC 6.4 10/20/2020   HGB 13.4 10/20/2020   HCT 38.4 (L) 10/20/2020   PLT 140.0 (L) 10/20/2020   GLUCOSE 100 (H) 10/20/2020   CHOL 153 11/12/2019   TRIG 141 11/12/2019   HDL 48 11/12/2019   LDLDIRECT 77.0 10/04/2017   LDLCALC 81 11/12/2019   ALT 17 11/12/2019   AST 21 11/12/2019   NA 140 10/20/2020   K 4.2 10/20/2020   CL 107 10/20/2020   CREATININE 1.37 10/20/2020   BUN 17 10/20/2020   CO2 25 10/20/2020   TSH 3.57 11/12/2019   PSA 3.49 12/04/2018   HGBA1C 5.4 11/12/2019    DG Chest 2 View  Result Date: 09/12/2017 CLINICAL DATA:  Cough, congestion, tightness on chest x 5 wks, HTN, hx of cardiac cath. EXAM: CHEST - 2 VIEW COMPARISON:  None. FINDINGS: Heart size is upper normal. 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 Lungs are clear. No pleural effusion or pneumothorax seen. 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 No acute or suspicious osseous finding. IMPRESSION: No active cardiopulmonary disease. No evidence of pneumonia or pulmonary edema. Electronically Signed   By: Bary Richard M.D.   On: 09/12/2017 10:16    Assessment & Plan:   Diagnoses and all orders for this visit:  Essential hypertension  Stage 3b chronic kidney disease (HCC)  Dietary folate deficiency anemia -     Discontinue: folic acid (FOLVITE) 1 MG tablet; Take 1 tablet (1 mg total) by mouth every other day.  Other dietary vitamin B12 deficiency anemia  Iron deficiency anemia due to chronic blood loss   I have discontinued Bruce Castilla [name] "Andy"'s folic acid. I am also having him maintain his aspirin EC, cholecalciferol, diclofenac sodium, ferrous sulfate, vitamin C, albuterol, EPINEPHrine (EPIPEN IJ), Cetirizine HCl (ZYRTEC PO), Misc Natural Products (TART CHERRY ADVANCED PO), TURMERIC  PO, sildenafil, clobetasol cream, lisinopril, citalopram, and atorvastatin.  Meds ordered this encounter  Medications   DISCONTD: folic acid (FOLVITE) 1 MG tablet    Sig: Take 1 tablet (1 mg total) by mouth every other day.    Dispense:  45 tablet    Refill:  1     Follow-up: No follow-ups on file.  Sanda Linger, MD

## 2020-11-13 ENCOUNTER — Other Ambulatory Visit: Payer: Self-pay | Admitting: Internal Medicine

## 2020-11-13 DIAGNOSIS — F418 Other specified anxiety disorders: Secondary | ICD-10-CM

## 2020-11-25 ENCOUNTER — Other Ambulatory Visit: Payer: Self-pay | Admitting: Internal Medicine

## 2020-11-25 DIAGNOSIS — E785 Hyperlipidemia, unspecified: Secondary | ICD-10-CM

## 2020-12-03 ENCOUNTER — Encounter: Payer: Self-pay | Admitting: Neurology

## 2020-12-03 ENCOUNTER — Telehealth: Payer: Self-pay | Admitting: *Deleted

## 2020-12-03 ENCOUNTER — Telehealth: Payer: Self-pay | Admitting: Neurology

## 2020-12-03 ENCOUNTER — Ambulatory Visit (INDEPENDENT_AMBULATORY_CARE_PROVIDER_SITE_OTHER): Payer: Medicare Other | Admitting: Neurology

## 2020-12-03 ENCOUNTER — Other Ambulatory Visit: Payer: Self-pay

## 2020-12-03 VITALS — BP 143/75 | HR 60 | Ht 65.0 in | Wt 139.0 lb

## 2020-12-03 DIAGNOSIS — I2510 Atherosclerotic heart disease of native coronary artery without angina pectoris: Secondary | ICD-10-CM | POA: Diagnosis not present

## 2020-12-03 DIAGNOSIS — Z820 Family history of epilepsy and other diseases of the nervous system: Secondary | ICD-10-CM | POA: Diagnosis not present

## 2020-12-03 DIAGNOSIS — R413 Other amnesia: Secondary | ICD-10-CM

## 2020-12-03 DIAGNOSIS — G309 Alzheimer's disease, unspecified: Secondary | ICD-10-CM | POA: Diagnosis not present

## 2020-12-03 DIAGNOSIS — F0390 Unspecified dementia without behavioral disturbance: Secondary | ICD-10-CM | POA: Diagnosis not present

## 2020-12-03 MED ORDER — DONEPEZIL HCL 5 MG PO TABS: 5.0000 mg | ORAL_TABLET | Freq: Every day | ORAL | 4 refills | Status: DC

## 2020-12-03 NOTE — Patient Instructions (Addendum)
MRI of the brain - will call to schedule Repeat Neuropsych testing - Dr. Clayborn Heron will call to schedule Start Aricept - at next appointment with increase dose and also consider adding a medication called Namend Follow up 4 months, in the meantime will call you with all results of testing  Donepezil tablets What is this medication? DONEPEZIL (doe NEP e zil) is used to treat mild to moderate dementia caused byAlzheimer's disease. This medicine may be used for other purposes; ask your health care provider orpharmacist if you have questions. COMMON BRAND NAME(S): Aricept What should I tell my care team before I take this medication? They need to know if you have any of these conditions: asthma or other lung disease difficulty passing urine head injury heart disease history of irregular heartbeat liver disease seizures (convulsions) stomach or intestinal disease, ulcers or stomach bleeding an unusual or allergic reaction to donepezil, other medicines, foods, dyes, or preservatives pregnant or trying to get pregnant breast-feeding How should I use this medication? Take this medicine by mouth with a glass of water. Follow the directions on the prescription label. You may take this medicine with or without food. Take this medicine at regular intervals. This medicine is usually taken before bedtime. Do not take it more often than directed. Continue to take your medicine even ifyou feel better. Do not stop taking except on your doctor's advice. If you are taking the 23 mg donepezil tablet, swallow it whole; do not cut,crush, or chew it. Talk to your pediatrician regarding the use of this medicine in children.Special care may be needed. Overdosage: If you think you have taken too much of this medicine contact apoison control center or emergency room at once. NOTE: This medicine is only for you. Do not share this medicine with others. What if I miss a dose? If you miss a dose, take it as soon  as you can. If it is almost time for yournext dose, take only that dose, do not take double or extra doses. What may interact with this medication? Do not take this medicine with any of the following medications: certain medicines for fungal infections like itraconazole, fluconazole, posaconazole, and voriconazole cisapride dextromethorphan; quinidine dronedarone pimozide quinidine thioridazine This medicine may also interact with the following medications: antihistamines for allergy, cough and cold atropine bethanechol carbamazepine certain medicines for bladder problems like oxybutynin, tolterodine certain medicines for Parkinson's disease like benztropine, trihexyphenidyl certain medicines for stomach problems like dicyclomine, hyoscyamine certain medicines for travel sickness like scopolamine dexamethasone dofetilide ipratropium NSAIDs, medicines for pain and inflammation, like ibuprofen or naproxen other medicines for Alzheimer's disease other medicines that prolong the QT interval (cause an abnormal heart rhythm) phenobarbital phenytoin rifampin, rifabutin or rifapentine ziprasidone This list may not describe all possible interactions. Give your health care provider a list of all the medicines, herbs, non-prescription drugs, or dietary supplements you use. Also tell them if you smoke, drink alcohol, or use illegaldrugs. Some items may interact with your medicine. What should I watch for while using this medication? Visit your doctor or health care professional for regular checks on your progress. Check with your doctor or health care professional if your symptomsdo not get better or if they get worse. You may get drowsy or dizzy. Do not drive, use machinery, or do anything thatneeds mental alertness until you know how this drug affects you. What side effects may I notice from receiving this medication? Side effects that you should report to your doctor or health care  professionalas soon as possible: allergic reactions like skin rash, itching or hives, swelling of the face, lips, or tongue feeling faint or lightheaded, falls loss of bladder control seizures signs and symptoms of a dangerous change in heartbeat or heart rhythm like chest pain; dizziness; fast or irregular heartbeat; palpitations; feeling faint or lightheaded, falls; breathing problems signs and symptoms of infection like fever or chills; cough; sore throat; pain or trouble passing urine signs and symptoms of liver injury like dark yellow or brown urine; general ill feeling or flu-like symptoms; light-colored stools; loss of appetite; nausea; right upper belly pain; unusually weak or tired; yellowing of the eyes or skin slow heartbeat or palpitations unusual bleeding or bruising vomiting Side effects that usually do not require medical attention (report to yourdoctor or health care professional if they continue or are bothersome): diarrhea, especially when starting treatment headache loss of appetite muscle cramps nausea stomach upset This list may not describe all possible side effects. Call your doctor for medical advice about side effects. You may report side effects to FDA at1-800-FDA-1088. Where should I keep my medication? Keep out of reach of children. Store at room temperature between 15 and 30 degrees C (59 and 86 degrees F).Throw away any unused medicine after the expiration date. NOTE: This sheet is a summary. It may not cover all possible information. If you have questions about this medicine, talk to your doctor, pharmacist, orhealth care provider.  2022 Elsevier/Gold Standard (2018-03-19 10:33:41)

## 2020-12-03 NOTE — Progress Notes (Signed)
[facility]    Provider:  Dr Lucia Gaskins Requesting Provider: Etta Grandchild, MD Primary Care Provider:  Etta Grandchild, MD  CC:  Memory loss  Interval history 12/03/2020: At last appointment we found B12 133. I don't see an MRI completed. Last B12 normal 10/2020. Folate was low however at 4.7. bmp with slightly reduced gfr. Cbc unremarkable except plts 140 and lymphopenia. He did not follow up after last appointment. PCP has addressed the folate and asked him to supplement. Memory is getting worse. Repeating the formal memory testing. Dr. Clayborn Heron. MRI brain. Sister recently diagnosed with dementia, they think Alzheimers. He is still very social, walks every day, no falls, no physical changes, no depression. See prior extensive History below.   HPI 06/21/2018:  Ikey Omary. is a 75 y.o. male here as requested by Etta Grandchild, MD for memory loss. PMHx osteoarthritis, impaired fasting glucose, hypertension, asthma, bradycardia, depression, hyperlipidemia, coronary artery disease, chronic ischemic heart disease.  He is here with his wife who provides much information.He doesn't remember things he used to remember. Started 3 years and slowly progressive. He was working in [place] and when he came home he had a difficult time using the remote. It is more short-term memory. If someone tells him something he forgets, such as conversations, forgets appointments, he remembers birthdays, wife has always taken care of the bills for 33 years, he is still driving , no accidents or getting lost, he repeats questions and stories in the same day such as what time they are closing will ask 4-5 times in one day. His coworker has noticed. He doesn't know his family history and unclear if there is a history of dementia, he has hearing impairment. More short-term memory loss. He has an older sister without any memory loss. They have 1-2 glasses of alcohol a day. Still having difficulty with the  remote but it is new, he still works with tools, he cooks, he makes lunch and brings it to his wife, he will make grilled cheese, he is more beligerant than he used to be, he works at [employer] and he will be more irritable and confrontational but not inappropriate behavior, no delusions or hallucinations. He feels hearing loss may be a factor as well. He is on an antidepressant and 3 years ago he had weight loss and wanted to sleep all the time and he feels better he does not feel sad most of the time. Socially nothing has changed. He takes his own medications but on a holiday it confused him when his edication was not in the right spots and wife had to help.No significant history of smoking. He feels refreshed in the morning, no snoring. wife provides much information. No other focal neurologic deficits, associated symptoms, inciting events or modifiable factors.  Reviewed notes, labs and imaging from outside physicians, which showed:  Reviewed Dr. Yetta Barre notes.  Patient and wife complaining about short-term memory and has been declining over the last few years.  Denied any other associated events.  Review of Systems: Patient complains of symptoms per HPI as well as the following symptoms: Memory loss, decreased energy, anemia, easy bruising, feeling cold, joint pain, hearing loss. Pertinent negatives and positives per HPI. All others negative.   Social History   Socioeconomic History   Marital status: Married    Spouse name: Not on file   Number of children: Not on file   Years of education: Not on file   Highest  education level: High school graduate  Occupational History   Not on file  Tobacco Use   Smoking status: Former   Smokeless tobacco: Never  Vaping Use   Vaping Use: Never used  Substance and Sexual Activity   Alcohol use: Yes    Comment: a cocktail once in  awhile   Drug use: No   Sexual activity: Yes    Birth control/protection: None  Other Topics Concern   Not on file   Social History Narrative   Lives at home with wife    Right handed   Caffeine: 2 cups/day of coke    Social Determinants of Health   Financial Resource Strain: Low Risk    Difficulty of Paying Living Expenses: Not hard at all  Food Insecurity: No Food Insecurity   Worried About Programme researcher, broadcasting/film/video in the Last Year: Never true   Barista in the Last Year: Never true  Transportation Needs: No Transportation Needs   Lack of Transportation (Medical): No   Lack of Transportation (Non-Medical): No  Physical Activity: Sufficiently Active   Days of Exercise per Week: 5 days   Minutes of Exercise per Session: 30 min  Stress: No Stress Concern Present   Feeling of Stress : Not at all  Social Connections: Socially Integrated   Frequency of Communication with Friends and Family: More than three times a week   Frequency of Social Gatherings with Friends and Family: Three times a week   Attends Religious Services: More than 4 times per year   Active Member of Clubs or Organizations: No   Attends Engineer, structural: More than 4 times per year   Marital Status: Married  Catering manager Violence: Not At Risk   Fear of Current or Ex-Partner: No   Emotionally Abused: No   Physically Abused: No   Sexually Abused: No    Family History  Problem Relation Age of Onset   Hypertension Mother    Heart disease Mother    Heart disease Father    Cancer Father    Alzheimer's disease Sister    Dementia Sister    Early death Brother 72       MVA   Stroke Brother    Early death Brother 78       Epilepsy    Past Medical History:  Diagnosis Date   Chronic ischemic heart disease    Coronary artery disease    Hyperlipidemia    Hypertension    IFG (impaired fasting glucose)    Osteoarthritis     Patient Active Problem List   Diagnosis Date Noted   Word finding difficulty 10/21/2020   Prepatellar bursitis, right knee 02/24/2020   Dietary folate deficiency anemia 05/16/2019    Stage 3b chronic kidney disease (HCC) 05/16/2019   Chronic ITP (idiopathic thrombocytopenia) (HCC) 05/16/2019   Other dietary vitamin B12 deficiency anemia 12/04/2018   Depression with anxiety 07/09/2018   Bradycardia, severe sinus 09/12/2017   Mild intermittent asthma with acute exacerbation 09/12/2017   Erectile dysfunction due to arterial insufficiency 08/09/2016   Coronary artery disease involving native coronary artery of native heart without angina pectoris 05/25/2016   Hyperlipidemia with target LDL less than 70 05/25/2016   Essential hypertension 05/25/2016   Prediabetes 05/25/2016   Iron deficiency anemia due to chronic blood loss 05/25/2016   Benign prostatic hyperplasia without lower urinary tract symptoms 05/25/2016   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 18 g 3   Ascorbic Acid (VITAMIN C) 100 MG tablet Take 100 mg by mouth daily.     aspirin EC 81 MG tablet Take 81 mg by mouth daily.     atorvastatin (LIPITOR) 40 MG tablet Take 1 tablet by mouth once daily 90 tablet 1   Cetirizine HCl (ZYRTEC PO) Take 10 mg by mouth daily.     cholecalciferol (VITAMIN D) 400 units TABS tablet Take 400 Units by mouth daily.     citalopram (CELEXA) 10 MG tablet Take 1 tablet by mouth once daily 90 tablet 1   clobetasol cream (TEMOVATE) 0.05 % Apply 1 application topically 2 (two) times daily as needed (rash). 60 g 3   diclofenac sodium (VOLTAREN) 1 % GEL Apply 2 g topically 4 (four) times daily as needed (pain).      donepezil (ARICEPT) 5 MG tablet Take 1 tablet (5 mg total) by mouth at bedtime. 30  tablet 4   EPINEPHrine (EPIPEN IJ) Inject as directed as directed.     ferrous sulfate 325 (65 FE) MG tablet Take 325 mg by mouth daily with breakfast.     folic acid (FOLVITE) 1 MG tablet Take 1 tablet (1 mg total) by mouth every other day. 45 tablet 1   lisinopril (ZESTRIL) 20 MG tablet Take 1 tablet (20 mg total) by mouth daily. 90 tablet 1   Misc Natural Products (TART CHERRY ADVANCED PO) Take by mouth.     sildenafil (VIAGRA) 100 MG tablet TAKE 1 TABLET BY MOUTH ONCE DAILY AS NEEDED FOR ERECTILE DYSFUNCTION 5 tablet 1   TURMERIC PO Take by mouth.     No current facility-administered medications for this visit.    Allergies as of 12/03/2020   (No Known Allergies)    Vitals: BP (!) 143/75 (BP Location: Right Arm, Patient Position: Sitting)   Pulse 60   Ht 5\' 5"  (1.651 m)   Wt 139 lb (63 kg)   BMI 23.13 kg/m  Last Weight:  Wt Readings from Last 1 Encounters:  12/03/20 139 lb (63 kg)   Last Height:   Ht Readings from Last 1 Encounters:  12/03/20 5\' 5"  (1.651 m)     Physical exam: repeated, stable Exam: Gen: NAD, conversant, well nourised, well groomed                     CV: RRR, no MRG. No Carotid Bruits. No peripheral edema, warm, nontender Eyes: Conjunctivae clear without exudates or hemorrhage  Neuro: repeated, stable except for decline in MMSE Detailed Neurologic Exam  Speech:    Speech is normal; fluent and spontaneous with normal comprehension.  Cognition:  MMSE - Mini Mental State Exam 12/03/2020 06/21/2018  Orientation to time 0 2  Orientation to Place 0 3  Registration 3 3  Attention/ Calculation 2 1  Recall 0 0  Language- name 2 objects 2 2  Language- repeat 1 1  Language- follow 3 step command 3 3  Language- read & follow direction 1 1  Write a sentence 1 1  Copy design 0 1  Total score 13 18     Cranial Nerves:  The pupils are equal, round, and reactive to light. Visual fields are full to finger confrontation. Extraocular movements are  intact. Trigeminal sensation is intact and the muscles of mastication are normal. The face is symmetric. The palate elevates in the midline. Hearing impaired. Voice is normal. Shoulder shrug is normal. The tongue has normal motion without fasciculations.   Coordination:    Normal   Gait:    normal.   Motor Observation:    No asymmetry, no atrophy, and no involuntary movements noted. Tone:    Normal muscle tone.    Posture:    Posture is normal. normal erect    Strength:    Strength is V/V in the upper and lower limbs.      Sensation: intact to LT     Reflex Exam:  DTR's:    Deep tendon reflexes in the upper and lower extremities are symmetrical  bilaterally.   Toes:    The toes are downgoing bilaterally.   Clonus:    Clonus is absent.    Assessment/Plan:  Mirian Mondrew T Wolden Jr. is a 75 y.o. male here as requested by Etta GrandchildJones, Thomas L, MD for memory loss. PMHx osteoarthritis, impaired fasting glucose, hypertension, asthma, bradycardia, depression, hyperlipidemia, coronary artery disease, chronic ischemic heart disease.  Patient and wife report approximately 3 years of declining memory, more short-term also a past medical history of depression and hearing loss which do not appear to be impacting him at this current moment.  Am concerned that this is dementia of Alzheimer's type however he needs a full work-up including MRI of the brain to rule out other causes of dementia as well as lab tests and repeat formal neurocognitive testing (they would like to go to [facility] to get this completed more quickly). Neuro exam non focal.  MMSE 18 out of 30 in 2020 now 13/30. Suspect Alzheimer's. Calling [facility] to get prior report.  - was lost to follow up during height of covid - reorder MRI brain - B12 133, B12 deficiency can cause neurocognitive disorders including dementia we started patient on B12 injections.now normal. Pcp currently adressing folate deficiency - discussed aricept, watch for  bradycardia, stop for any symptoms, discussed (today's pulse 60). In 4 months will increase to 10mg  if does well and then consider Namenda. Will cc cardiology - Repeat neuropsych testing, get prior from [facility] for Dr. Roseanne RenoStewart to compare. (Sister just diagnosed with dementia, possibly alzheimers)    Orders Placed This Encounter  Procedures   MR BRAIN W WO CONTRAST   Ambulatory referral to Neuropsychology   Meds ordered this encounter  Medications   donepezil (ARICEPT) 5 MG tablet    Sig: Take 1 tablet (5 mg total) by mouth at bedtime.    Dispense:  30 tablet    Refill:  4    Cc: Dr Yetta BarreJones.  Cc: Etta GrandchildJones, Thomas L, MD,  [facility] [place]  Naomie DeanAntonia Talbot Monarch, MD  [facility] [address] [place], [place] [id number]  Phone [phone] Fax [phone]   I spent 40 minutes of face-to-face and non-face-to-face time with patient on the  1. Dementia without behavioral disturbance, unspecified dementia type (HCC)   2. Alzheimer's disease, unspecified (CODE) (HCC)   3. Short-term memory loss   4. FHx: Alzheimer's disease    diagnosis.  This included previsit chart review, lab review, study review, order entry, electronic health record documentation, patient education on the different diagnostic and therapeutic options, counseling and coordination of care, risks  and benefits of management, compliance, or risk factor reduction

## 2020-12-03 NOTE — Telephone Encounter (Signed)
Medicare order sent to [facility]. [facility] they will reach out to the patient to schedule.  

## 2020-12-03 NOTE — Telephone Encounter (Signed)
Dr Lucia Gaskins would like the neuropsych testing records to review from [facility]. We referred patient there in March 2020. Their phone number is [phone] and fax is [phone].

## 2020-12-04 ENCOUNTER — Encounter (HOSPITAL_COMMUNITY): Payer: Self-pay

## 2020-12-04 ENCOUNTER — Other Ambulatory Visit: Payer: Self-pay

## 2020-12-04 ENCOUNTER — Emergency Department (HOSPITAL_COMMUNITY): Payer: Medicare Other

## 2020-12-04 ENCOUNTER — Emergency Department (HOSPITAL_COMMUNITY)
Admission: EM | Admit: 2020-12-04 | Discharge: 2020-12-05 | Disposition: A | Discharge status: Home / Self Care | Payer: Medicare Other | Attending: Emergency Medicine | Admitting: Emergency Medicine

## 2020-12-04 ENCOUNTER — Other Ambulatory Visit: Payer: Self-pay | Admitting: Neurology

## 2020-12-04 DIAGNOSIS — Z87891 Personal history of nicotine dependence: Secondary | ICD-10-CM | POA: Insufficient documentation

## 2020-12-04 DIAGNOSIS — Z77018 Contact with and (suspected) exposure to other hazardous metals: Secondary | ICD-10-CM

## 2020-12-04 DIAGNOSIS — D631 Anemia in chronic kidney disease: Secondary | ICD-10-CM | POA: Insufficient documentation

## 2020-12-04 DIAGNOSIS — R319 Hematuria, unspecified: Secondary | ICD-10-CM

## 2020-12-04 DIAGNOSIS — N400 Enlarged prostate without lower urinary tract symptoms: Secondary | ICD-10-CM | POA: Diagnosis not present

## 2020-12-04 DIAGNOSIS — I129 Hypertensive chronic kidney disease with stage 1 through stage 4 chronic kidney disease, or unspecified chronic kidney disease: Secondary | ICD-10-CM | POA: Insufficient documentation

## 2020-12-04 DIAGNOSIS — J4521 Mild intermittent asthma with (acute) exacerbation: Secondary | ICD-10-CM | POA: Diagnosis not present

## 2020-12-04 DIAGNOSIS — Z7982 Long term (current) use of aspirin: Secondary | ICD-10-CM | POA: Insufficient documentation

## 2020-12-04 DIAGNOSIS — I2510 Atherosclerotic heart disease of native coronary artery without angina pectoris: Secondary | ICD-10-CM | POA: Insufficient documentation

## 2020-12-04 DIAGNOSIS — Z79899 Other long term (current) drug therapy: Secondary | ICD-10-CM | POA: Insufficient documentation

## 2020-12-04 DIAGNOSIS — K5730 Diverticulosis of large intestine without perforation or abscess without bleeding: Secondary | ICD-10-CM | POA: Diagnosis not present

## 2020-12-04 DIAGNOSIS — N1832 Chronic kidney disease, stage 3b: Secondary | ICD-10-CM | POA: Diagnosis not present

## 2020-12-04 DIAGNOSIS — K7689 Other specified diseases of liver: Secondary | ICD-10-CM | POA: Diagnosis not present

## 2020-12-04 LAB — COMPREHENSIVE METABOLIC PANEL
ALT: 24 U/L (ref 0–44)
AST: 31 U/L (ref 15–41)
Albumin: 4.4 g/dL (ref 3.5–5.0)
Alkaline Phosphatase: 130 U/L — ABNORMAL HIGH (ref 38–126)
Anion gap: 9 (ref 5–15)
BUN: 17 mg/dL (ref 8–23)
CO2: 22 mmol/L (ref 22–32)
Calcium: 9.4 mg/dL (ref 8.9–10.3)
Chloride: 105 mmol/L (ref 98–111)
Creatinine, Ser: 1.42 mg/dL — ABNORMAL HIGH (ref 0.61–1.24)
GFR, Estimated: 51 mL/min — ABNORMAL LOW (ref >60)
Glucose, Bld: 140 mg/dL — ABNORMAL HIGH (ref 70–99)
Potassium: 3.6 mmol/L (ref 3.5–5.1)
Sodium: 136 mmol/L (ref 135–145)
Total Bilirubin: 1.2 mg/dL (ref 0.3–1.2)
Total Protein: 7.3 g/dL (ref 6.5–8.1)

## 2020-12-04 LAB — URINALYSIS, ROUTINE W REFLEX MICROSCOPIC

## 2020-12-04 LAB — CBC WITH DIFFERENTIAL/PLATELET
Abs Immature Granulocytes: 0.07 10*3/uL (ref 0.00–0.07)
Basophils Absolute: 0.1 10*3/uL (ref 0.0–0.1)
Basophils Relative: 0 %
Eosinophils Absolute: 0.2 10*3/uL (ref 0.0–0.5)
Eosinophils Relative: 1 %
HCT: 43.7 % (ref 39.0–52.0)
Hemoglobin: 14.5 g/dL (ref 13.0–17.0)
Immature Granulocytes: 1 %
Lymphocytes Relative: 3 %
Lymphs Abs: 0.4 10*3/uL — ABNORMAL LOW (ref 0.7–4.0)
MCH: 32.2 pg (ref 26.0–34.0)
MCHC: 33.2 g/dL (ref 30.0–36.0)
MCV: 96.9 fL (ref 80.0–100.0)
Monocytes Absolute: 0.9 10*3/uL (ref 0.1–1.0)
Monocytes Relative: 6 %
Neutro Abs: 12.3 10*3/uL — ABNORMAL HIGH (ref 1.7–7.7)
Neutrophils Relative %: 89 %
Platelets: 154 10*3/uL (ref 150–400)
RBC: 4.51 MIL/uL (ref 4.22–5.81)
RDW: 13.4 % (ref 11.5–15.5)
WBC: 13.9 10*3/uL — ABNORMAL HIGH (ref 4.0–10.5)
nRBC: 0 % (ref 0.0–0.2)

## 2020-12-04 LAB — URINALYSIS, MICROSCOPIC (REFLEX)
RBC / HPF: >50 RBC/hpf (ref 0–5)
Squamous Epithelial / HPF: NONE SEEN (ref 0–5)
WBC, UA: >50 WBC/hpf (ref 0–5)

## 2020-12-04 LAB — PROTIME-INR
INR: 1 (ref 0.8–1.2)
Prothrombin Time: 12.7 seconds (ref 11.4–15.2)

## 2020-12-04 MED ORDER — CEPHALEXIN 500 MG PO CAPS: 500.0000 mg | ORAL_CAPSULE | Freq: Two times a day (BID) | ORAL | 0 refills | Status: DC

## 2020-12-04 NOTE — ED Triage Notes (Signed)
Pt came in via POV d/t frequent blood urination. Rates discomfort level when he is urinating to be 3/10. Pt denies fevers or abd pain.

## 2020-12-04 NOTE — ED Provider Notes (Signed)
Emergency Medicine Provider Triage Evaluation Note  Bruce Jordan. , a 75 y.o. male  was evaluated in triage.  Pt complains of blood in his urine.  He states that he started having blood in his urine today after lunch.  He denies any recent trauma or falls.  No history of similar.  No recent catheters, surgical or procedures.  He does not take any blood thinners other than aspirin.  He denies any penile or testicular pain.  No flank pain.  No history of kidney stones..  Review of Systems  Positive: Hematuria Negative: Syncope, abdominal pain, fevers  Physical Exam  BP (!) 162/83 (BP Location: Left Arm)   Pulse 95   Temp 98.4 F (36.9 C) (Oral)   Resp 18   Ht 5\' 5"  (1.651 m)   Wt 62.6 kg   SpO2 98%   BMI 22.96 kg/m  Gen:   Awake, no distress   Resp:  Normal effort  MSK:   Moves extremities without difficulty  Other:  Urine at bedside appears to be significantly bloody  Medical Decision Making  Medically screening exam initiated at 7:10 PM.  Appropriate orders placed.  . was informed that the remainder of the evaluation will be completed by another provider, this initial triage assessment does not replace that evaluation, and the importance of remaining in the ED until their evaluation is complete.  Note: Portions of this report may have been transcribed using voice recognition software. Every effort was made to ensure accuracy; however, inadvertent computerized transcription errors may be present    Mirian Mo 12/04/20 1911    12/06/20, MD 12/07/20 [id number]

## 2020-12-04 NOTE — ED Provider Notes (Signed)
[facility] EMERGENCY DEPARTMENT Provider Note   CSN: [id number] Arrival date & time: 12/04/20  1752     History Chief Complaint  Patient presents with   Blood in urine    Bruce Mo. is a 75 y.o. male.  Patient with past medical history of dementia presents today with painless hematuria. Patient states that first episode occurred today after eating lunch and has occurred every time since. No clots noted, has endorsed polyuria. Denies any associated abdominal or flank pain, only some moderate burning at the tip of his penis while voiding. No history of smoking or kidney stones. Denies any use of blood thinners or any other unusual bruising or bleeding. Also denies urinary retention or urgency. No recent catheterizations or surgical procedures.   The history is provided by the patient and the spouse. No language interpreter was used.      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/16/2019   Chronic ITP (idiopathic thrombocytopenia) (HCC) 05/16/2019   Other dietary vitamin B12 deficiency anemia 12/04/2018   Depression with anxiety 07/09/2018   Bradycardia, severe sinus 09/12/2017   Mild intermittent asthma with acute exacerbation 09/12/2017   Erectile dysfunction due to arterial insufficiency 08/09/2016   Coronary artery disease involving native coronary artery of native heart without angina pectoris 05/25/2016   Hyperlipidemia with target LDL less than 70 05/25/2016   Essential hypertension 05/25/2016   Prediabetes 05/25/2016   Iron deficiency anemia due to chronic blood loss  05/25/2016   Benign prostatic hyperplasia without lower urinary tract symptoms 05/25/2016   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   Eczema 05/25/2016   Primary osteoarthritis of both shoulders 05/25/2016    Past Surgical History:  Procedure Laterality Date   CARDIAC CATHETERIZATION         Family History  Problem Relation Age of Onset   Hypertension Mother    Heart disease Mother    Heart disease Father    Cancer Father    Alzheimer's disease Sister    Dementia Sister    Early death Brother 62       MVA   Stroke Brother    Early death Brother 81       Epilepsy    Social History   Tobacco Use   Smoking status: Former   Smokeless tobacco: Never  Building services engineer Use: Never used  Substance Use Topics   Alcohol use: Yes    Comment: a cocktail once in  awhile   Drug use: No    Home Medications Prior to Admission medications   Medication Sig Start Date End Date Taking? Authorizing Provider  albuterol (PROVENTIL HFA;VENTOLIN HFA) 108 (90 Base) MCG/ACT inhaler Inhale 1-2 puffs into the lungs every 6 (six) hours as needed for wheezing or shortness of breath. 09/12/17   Etta Grandchild, MD  Ascorbic Acid (VITAMIN C) 100 MG tablet Take 100 mg by mouth daily.    [provider]  aspirin EC 81 MG tablet Take 81 mg by mouth daily.    [provider]  atorvastatin (LIPITOR)  40 MG tablet Take 1 tablet by mouth once daily 11/25/20   Etta Grandchild, MD  Cetirizine HCl (ZYRTEC PO) Take 10 mg by mouth daily.    [provider]  cholecalciferol (VITAMIN D) 400 units TABS tablet Take 400 Units by mouth daily.    [provider]  citalopram (CELEXA) 10 MG tablet Take 1 tablet by mouth once daily 11/13/20   Etta Grandchild, MD  clobetasol cream (TEMOVATE) 0.05 % Apply 1 application topically 2 (two) times daily as needed (rash). 03/09/20   Etta Grandchild, MD  diclofenac sodium (VOLTAREN) 1 % GEL Apply 2 g topically 4 (four) times daily as needed  (pain).     [provider]  donepezil (ARICEPT) 5 MG tablet Take 1 tablet (5 mg total) by mouth at bedtime. 12/03/20   Anson Fret, MD  EPINEPHrine (EPIPEN IJ) Inject as directed as directed.    [provider]  ferrous sulfate 325 (65 FE) MG tablet Take 325 mg by mouth daily with breakfast.    [provider]  folic acid (FOLVITE) 1 MG tablet Take 1 tablet (1 mg total) by mouth every other day. 10/21/20   Etta Grandchild, MD  lisinopril (ZESTRIL) 20 MG tablet Take 1 tablet (20 mg total) by mouth daily. 06/23/20   Etta Grandchild, MD  Misc Natural Products (TART CHERRY ADVANCED PO) Take by mouth.    [provider]  sildenafil (VIAGRA) 100 MG tablet TAKE 1 TABLET BY MOUTH ONCE DAILY AS NEEDED FOR ERECTILE DYSFUNCTION 12/20/19   Etta Grandchild, MD  TURMERIC PO Take by mouth.    [provider]    Allergies    Patient has no known allergies.  Review of Systems   Review of Systems  Constitutional:  Negative for chills, diaphoresis, fatigue, fever and unexpected weight change.  HENT:  Negative for nosebleeds.   Respiratory:  Negative for shortness of breath.   Cardiovascular:  Negative for chest pain and palpitations.  Gastrointestinal:  Negative for abdominal pain, blood in stool, diarrhea, nausea and vomiting.  Endocrine: Positive for polyuria.  Genitourinary:  Positive for dysuria, frequency, hematuria and penile pain. Negative for decreased urine volume, difficulty urinating, flank pain, genital sores, penile discharge and urgency.  Skin:  Negative for pallor.  Neurological:  Negative for dizziness and light-headedness.  Hematological:  Does not bruise/bleed easily.  All other systems reviewed and are negative.  Physical Exam Updated Vital Signs BP (!) 150/104   Pulse (!) 101   Temp 98.4 F (36.9 C) (Oral)   Resp 18   Ht 5\' 5"  (1.651 m)   Wt 62.6 kg   SpO2 95%   BMI 22.96 kg/m   Physical Exam Vitals and nursing note reviewed.   Constitutional:      General: He is not in acute distress.    Appearance: Normal appearance. He is not ill-appearing, toxic-appearing or diaphoretic.  HENT:     Head: Normocephalic and atraumatic.  Cardiovascular:     Rate and Rhythm: Normal rate and regular rhythm.     Pulses: Normal pulses.     Heart sounds: Normal heart sounds.  Pulmonary:     Effort: Pulmonary effort is normal.     Breath sounds: Normal breath sounds.  Abdominal:     General: Abdomen is flat. Bowel sounds are normal. There is no distension.     Palpations: Abdomen is soft. There is no mass.     Tenderness: There is no  abdominal tenderness. There is no right CVA tenderness, left CVA tenderness, guarding or rebound.     Hernia: No hernia is present.  Musculoskeletal:     Cervical back: Normal range of motion.  Skin:    General: Skin is warm and dry.  Neurological:     General: No focal deficit present.     Mental Status: He is alert.  Psychiatric:        Behavior: Behavior normal.    ED Results / Procedures / Treatments   Labs (all labs ordered are listed, but only abnormal results are displayed) Labs Reviewed  URINALYSIS, ROUTINE W REFLEX MICROSCOPIC - Abnormal; Notable for the following components:      Result Value   Color, Urine RED (*)    APPearance TURBID (*)    Glucose, UA   (*)    Value: TEST NOT REPORTED DUE TO COLOR INTERFERENCE OF URINE PIGMENT   Hgb urine dipstick   (*)    Value: TEST NOT REPORTED DUE TO COLOR INTERFERENCE OF URINE PIGMENT   Bilirubin Urine   (*)    Value: TEST NOT REPORTED DUE TO COLOR INTERFERENCE OF URINE PIGMENT   Ketones, ur   (*)    Value: TEST NOT REPORTED DUE TO COLOR INTERFERENCE OF URINE PIGMENT   Protein, ur   (*)    Value: TEST NOT REPORTED DUE TO COLOR INTERFERENCE OF URINE PIGMENT   Nitrite   (*)    Value: TEST NOT REPORTED DUE TO COLOR INTERFERENCE OF URINE PIGMENT   Leukocytes,Ua   (*)    Value: TEST NOT REPORTED DUE TO COLOR INTERFERENCE OF URINE  PIGMENT   All other components within normal limits  COMPREHENSIVE METABOLIC PANEL - Abnormal; Notable for the following components:   Glucose, Bld 140 (*)    Creatinine, Ser 1.42 (*)    Alkaline Phosphatase 130 (*)    GFR, Estimated 51 (*)    All other components within normal limits  CBC WITH DIFFERENTIAL/PLATELET - Abnormal; Notable for the following components:   WBC 13.9 (*)    Neutro Abs 12.3 (*)    Lymphs Abs 0.4 (*)    All other components within normal limits  URINALYSIS, MICROSCOPIC (REFLEX) - Abnormal; Notable for the following components:   Bacteria, UA RARE (*)    All other components within normal limits  URINE CULTURE  PROTIME-INR    EKG None  Radiology CT Renal Stone Study  Result Date: 12/04/2020 CLINICAL DATA:  Hematuria. EXAM: CT ABDOMEN AND PELVIS WITHOUT CONTRAST TECHNIQUE: Multidetector CT imaging of the abdomen and pelvis was performed following the standard protocol without IV contrast. COMPARISON:  None. FINDINGS: Evaluation of this exam is limited in the absence of intravenous contrast. Lower chest: The visualized lung bases are clear. No intra-abdominal free air or free fluid. Hepatobiliary: Subcentimeter right hepatic hypodense focus is too small characterize. The liver is otherwise unremarkable. No intrahepatic biliary ductal dilatation. The gallbladder is unremarkable. Pancreas: Unremarkable. No pancreatic ductal dilatation or surrounding inflammatory changes. Spleen: Normal in size without focal abnormality. Adrenals/Urinary Tract: The adrenal glands are unremarkable there is no hydronephrosis or nephrolithiasis on either side. There is a 3.5 cm exophytic lesion from the inferior pole of the left kidney which demonstrates fluid attenuation most consistent. Additional 15 mm hypodense lesion from the upper pole of the right kidney, also likely a cyst. The visualized ureters appear unremarkable. The urinary bladder is collapsed. Stomach/Bowel: There is severe  sigmoid diverticulosis without active inflammatory changes. Scattered  colonic diverticula. There is no bowel obstruction or active inflammation. There is a 3.7 cm proximal duodenal diverticulum and a 2.5 cm distal duodenal diverticulum. There is loose stool throughout the colon compatible with diarrheal state. Correlation with clinical exam and stool cultures recommended. The appendix is normal. Vascular/Lymphatic: Moderate aortoiliac atherosclerotic disease. The IVC is unremarkable. No portal venous gas. There is no adenopathy. Reproductive: Enlarged prostate gland with median lobe hypertrophy indenting the base of the bladder. The prostate gland measures 5.3 cm in transverse axial diameter. The seminal vesicles are symmetric. Other: Ventral hernia repair mesh. Musculoskeletal: Osteopenia with degenerative changes of the spine. No acute osseous pathology. IMPRESSION: 1. No hydronephrosis or nephrolithiasis. 2. Diarrheal state. Correlation with clinical exam and stool cultures recommended. No bowel obstruction. Normal appendix. 3. Colonic diverticulosis. 4. Enlarged prostate gland with median lobe hypertrophy indenting the base of the bladder. 5. Aortic Atherosclerosis (ICD10-I70.0). Electronically Signed   By: Elgie CollardArash  Radparvar M.D.   On: 12/04/2020 23:36    Procedures Procedures   Medications Ordered in ED Medications - No data to display  ED Course  I have reviewed the triage vital signs and the nursing notes.  Pertinent labs & imaging results that were available during my care of the patient were reviewed by me and considered in my medical decision making (see chart for details).    MDM Rules/Calculators/A&P                         Patient presents for painless hematuria since this afternoon. Urinalysis confirms presence of blood, unable to determine presence of infection due to presence of gross blood. Patient does endorse some burning upon urination, WBC count of 13.9 noted on CBC. Patient is  afebrile and non-toxic appearing. No difficulty with urination, blood clots present, or other signs of obstruction at this time. CT renal stone study shows no sign of obstruction, hydrocephaly, or nephrolithiasis. Discussed case with attending, agree with plan to discharge with antibiotics and urology follow-up for further assessment. Patient informed to return if obstructive symptoms develop.  Final Clinical Impression(s) / ED Diagnoses Final diagnoses:  Hematuria, unspecified type    Rx / DC Orders ED Discharge Orders          Ordered    cephALEXin (KEFLEX) 500 MG capsule  2 times daily        12/04/20 2350             Silva BandySmoot, Lizzette Carbonell A, [place] 12/04/20 2358    Wynetta FinesMessick, Peter C, MD 12/09/20 2239

## 2020-12-04 NOTE — ED Notes (Signed)
Wife reports pt has recently been diagnosed with dementia & takes Aricept.

## 2020-12-04 NOTE — Discharge Instructions (Addendum)
Follow-up with urology for symptoms. Return if unable to urinate or if significant pain develops

## 2020-12-07 LAB — URINE CULTURE: Culture: >=100000 — AB

## 2020-12-08 ENCOUNTER — Encounter: Payer: Self-pay | Admitting: Psychology

## 2020-12-08 ENCOUNTER — Telehealth: Payer: Self-pay

## 2020-12-08 NOTE — Progress Notes (Signed)
ED Antimicrobial Stewardship Positive Culture Follow Up   Bruce Jordan. is an 75 y.o. male who presented to [facility] on 12/04/2020 with a chief complaint of  Chief Complaint  Patient presents with   Blood in urine    Recent Results (from the past 720 hour(s))  Urine Culture     Status: Abnormal   Collection Time: 12/04/20  7:10 PM   Specimen: Urine, Clean Catch  Result Value Ref Range Status   Specimen Description URINE, CLEAN CATCH  Final   Special Requests   Final    NONE Performed at [facility], [address], [place], [place] [address]    Culture >=100,000 COLONIES/mL ENTEROBACTER AEROGENES (A)  Final   Report Status 12/07/2020 FINAL  Final   Organism ID, Bacteria ENTEROBACTER AEROGENES (A)  Final      Susceptibility   Enterobacter aerogenes - MIC*    CEFAZOLIN RESISTANT Resistant     CEFEPIME <=0.12 SENSITIVE Sensitive     CEFTRIAXONE <=0.25 SENSITIVE Sensitive     CIPROFLOXACIN <=0.25 SENSITIVE Sensitive     GENTAMICIN <=1 SENSITIVE Sensitive     IMIPENEM 1 SENSITIVE Sensitive     NITROFURANTOIN 64 INTERMEDIATE Intermediate     TRIMETH/SULFA <=20 SENSITIVE Sensitive     PIP/TAZO <=4 SENSITIVE Sensitive     * >=100,000 COLONIES/mL ENTEROBACTER AEROGENES    [x]  Treated with cephalexin, organism resistant to prescribed antimicrobial  Plan:  -Stop cephalexin and call patient for symptom check -If patient's symptoms have resolved, no further antibiotic treatment is needed -If symptoms persist, start Bactrim DS 1 tab PO BID x3 days  ED Provider: , MD   Susy Frizzle 12/08/2020, 9:13 AM Clinical Pharmacist Monday - Friday phone -  [phone] Saturday - Sunday phone - [phone]

## 2020-12-08 NOTE — Telephone Encounter (Signed)
Post ED Visit - Positive Culture Follow-up: Successful Patient Follow-Up  Culture assessed and recommendations reviewed by:  []  , Pharm.D. []  Enzo Bi, Pharm.D., BCPS AQ-ID []  , Pharm.D., BCPS []  Celedonio Miyamoto, Pharm.D., BCPS []  [place], Garvin Fila.D., BCPS, AAHIVP []  , Pharm.D., BCPS, AAHIVP []  [place]ina Pillion, PharmD, BCPS []  , PharmD, BCPS []  [place], PharmD, BCPS []  [address], PharmD  Positive urine culture  []  Patient discharged without antimicrobial prescription and treatment is now indicated [x]  Organism is resistant to prescribed ED discharge antimicrobial []  Patient with positive blood cultures  Changes discussed with ED provider: , MD  Spoke with pt caregiver wife, pt continues to have urinary symptoms. Instructed to stop taking Cephalexin and begin Bactrim DS po BID x 3 days.  New antibiotic prescription Bactrim DS 1 tab po BID x 3 days.  Called to [facility] [place] on [address]   Contacted patient, date 12/08/2020, time 10:50 am   Lysle Pearl 12/08/2020, 10:57 AM

## 2020-12-15 ENCOUNTER — Encounter: Payer: Self-pay | Admitting: Psychology

## 2020-12-15 ENCOUNTER — Ambulatory Visit (INDEPENDENT_AMBULATORY_CARE_PROVIDER_SITE_OTHER): Payer: Medicare Other | Admitting: Psychology

## 2020-12-15 ENCOUNTER — Other Ambulatory Visit: Payer: Self-pay

## 2020-12-15 ENCOUNTER — Ambulatory Visit: Payer: Medicare Other | Admitting: Psychology

## 2020-12-15 DIAGNOSIS — F0280 Dementia in other diseases classified elsewhere without behavioral disturbance: Secondary | ICD-10-CM

## 2020-12-15 DIAGNOSIS — G309 Alzheimer's disease, unspecified: Secondary | ICD-10-CM

## 2020-12-15 DIAGNOSIS — R4189 Other symptoms and signs involving cognitive functions and awareness: Secondary | ICD-10-CM

## 2020-12-15 HISTORY — DX: Dementia in other diseases classified elsewhere, unspecified severity, without behavioral disturbance, psychotic disturbance, mood disturbance, and anxiety: G30.9

## 2020-12-15 HISTORY — DX: Dementia in other diseases classified elsewhere, unspecified severity, without behavioral disturbance, psychotic disturbance, mood disturbance, and anxiety: F02.80

## 2020-12-15 NOTE — Progress Notes (Signed)
   Psychometrician Note   Cognitive testing was administered to Bruce Mo. by Bruce Jordan, B.S. (psychometrist) under the supervision of Dr. Newman Nickels, Ph.D., licensed psychologist on 12/15/20. Bruce Jordan did not appear overtly distressed by the testing session per behavioral observation or responses across self-report questionnaires. Rest breaks were offered.    The battery of tests administered was selected by Dr. Newman Nickels, Ph.D. with consideration to Bruce Jordan current level of functioning, the nature of his symptoms, emotional and behavioral responses during interview, level of literacy, observed level of motivation/effort, and the nature of the referral question. This battery was communicated to the psychometrist. Communication between Dr. Newman Nickels, Ph.D. and the psychometrist was ongoing throughout the evaluation and Dr. Newman Nickels, Ph.D. was immediately accessible at all times. Dr. Newman Nickels, Ph.D. provided supervision to the psychometrist on the date of this service to the extent necessary to assure the quality of all services provided.    Bruce Mo. will return within approximately 1-2 weeks for an interactive feedback session with Bruce Jordan at which time his test performances, clinical impressions, and treatment recommendations will be reviewed in detail. Bruce Jordan understands he can contact our office should he require our assistance before this time.  A total of 125 minutes of billable time were spent face-to-face with Bruce Jordan by the psychometrist. This includes both test administration and scoring time. Billing for these services is reflected in the clinical report generated by Dr. Newman Nickels, Ph.D.  This note reflects time spent with the psychometrician and does not include test scores or any clinical interpretations made by Bruce Jordan. The full report will follow in a separate note.

## 2020-12-15 NOTE — Progress Notes (Signed)
NEUROPSYCHOLOGICAL EVALUATION [place] [place] Department of Neurology  Date of Evaluation: December 15, 2020  Reason for Referral:   Bruce Mondrew T Filyaw Jr. is a 75 y.o. right-handed Caucasian male referred by  Naomie DeanAntonia Ahern, M.D. 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   Assessment and Plan:   Clinical Impression(s): Bruce Jordan's pattern of performance is suggestive of diffuse cognitive impairment generally in the exceptionally low normative ranges relative to age-matched peers. He was very poorly oriented at the time of testing. Examples include him stating the current month and year as November 2008 and that he was currently in [place], [place] rather than [place], [place]. He also could not provide his address (both street and city). Across testing, he exhibited a lone strength across basic attention. There was also some variability across confrontation naming in that he performed adequately on a screening task but had far greater difficulty across a more comprehensive task. All other cognitive domains exhibited severe impairment. This included processing speed, complex attention, executive functioning, safety/judgment, receptive language, verbal fluency, visuospatial abilities, and learning and memory. Regarding activities of daily living, Bruce Jordan's wife noted that she must organize his medications to ensure that these are taken accurately. She has managed finances for over 30 years and Bruce Jordan reported driving locally without noted issue. 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  Given the diffuse nature of significant cognitive impairment, the etiology of dysfunction is somewhat unclear. However, I do have concerns surrounding the presence of Alzheimer's disease. Across memory tasks, Bruce Jordan had significant trouble learning new information and did not benefit from repeated exposure. After a delay, he was fully amnestic (i.e., 0% retention scores) across all assessments. Performance across yes/no recognition tasks was mildly variable, but overall below expectation as he benefited from correct guessing at times. Overall, memory performance suggests a strong storage deficit, which is the hallmark characteristic of this disease process. His age, records reporting gradual, progressive memory decline over many years, and population base rates also would suggest Alzheimer's disease being most likely. I do not have neuroimaging to review (a brain MRI is scheduled for 12/16/2020) and a vascular contribution could be possible given his medical history. However, these contributions would in all likelihood worsen Alzheimer's pathology rather than fully account for impairments. He did not demonstrate any behavioral features concerning for Lewy body dementia or frontotemporal dementia at the present time. Continued medical monitoring will be important moving forward.  Recommendations: Dr. Lucia GaskinsAhern has already prescribed medication commonly given to individuals with memory loss and concerns for Alzheimer's disease (i.e., donepezil/Aricept). He is strongly encouraged to continue taking this medication as prescribed. It is important to highlight that this medication has been shown to slow functional decline in some individuals. Unfortunately, there is no current treatment which can stop or reverse progressive cognitive decline.   Should the concern regarding mold in their apartment resolve and Bruce Jordan and his wife report a concurrent notable improvement in functioning, a repeat  evaluation would be warranted at that time.   Performance across neurocognitive testing is not a strong predictor of an individual's safety operating a motor vehicle. However, notable cognitive impairment,  especially across processing speed, complex attention, and executive functioning, certainly raises concerns about his safety behind the wheel. I would recommend that he abstain from driving pending a formal driving evaluation. Should his family wish to pursue a formal driving evaluation, they would be encouraged to contact [employer] in [place], [place] at [phone]. Another option would be through [facility]; however, the latter would likely require a referral from a medical doctor. [facility] can be reached directly at [phone].   Should there be a further progression of deficits over time, Bruce Jordan is unlikely to regain any independent living skills lost. Therefore, it is recommended that he remain as involved as possible in all aspects of household chores, finances, and medication management, with supervision to ensure adequate performance. He will likely benefit from the establishment and maintenance of a routine in order to maximize his functional abilities over time.  It will be important for Bruce Jordan to have another person with him when in situations where he may need to process information, weigh the pros and cons of different options, and make decisions, in order to ensure that he fully understands and recalls all information to be considered.  If not already done, he and his family may want to discuss his wishes regarding durable power of attorney and medical decision making, so that he can have input into these choices. Additionally, they may wish to discuss future plans for caretaking and seek out community options for in home/residential care should they become necessary.  Bruce Jordan is encouraged to attend to lifestyle factors for brain health (e.g.,  regular physical exercise, good nutrition habits, regular participation in cognitively-stimulating activities, and general stress management techniques), which are likely to have benefits for both emotional adjustment and cognition. Optimal control of vascular risk factors (including safe cardiovascular exercise and adherence to dietary recommendations) is encouraged. Continued participation in activities which provide mental stimulation and social interaction is also recommended.   Important information to remember should be provided in written formats in all instances. This should be placed in a highly visible and commonly frequented area of his residence to help promote recall.   To address problems with processing speed, he may wish to consider:   -Ensuring that he is alerted when essential material or instructions are being presented   -Adjusting the speed at which new information is presented   -Allowing for more time in comprehending, processing, and responding in conversation  To address problems with fluctuating attention and executive dysfunction, he may wish to consider:   -Avoiding external distractions when needing to concentrate   -Limiting exposure to fast paced environments with multiple sensory demands   -Writing down complicated information and using checklists   -Attempting and completing one task at a time (i.e., no multi-tasking)   -Verbalizing aloud each step of a task to maintain focus   -Taking frequent breaks during the completion of steps/tasks to avoid fatigue   -Reducing the amount of information considered at one time  Review of Records:   Bruce Jordan was seen by [facility] Naomie Dean, M.D.) on 06/21/2018 for ongoing memory loss and concerns surrounding dementia. At that time Bruce Jordan and his wife reported progressive memory loss for the previous three years. They reported trouble recalling previous conversations and upcoming appointments. He  asks repetitive questions and requires things to be repeated to him frequently. There is also report of him having a difficult time operating a remote control. At that time,  he was reportedly driving without issue or concern and taking his own medications. His wife was managing finances, which was a longstanding behavior. He wife noted some personality changes in that he had been seeming more belligerent than typical. She also noted him seeming for irritable and confrontational; however, not to the extent that he was engaging in inappropriate behavior. He reported ongoing hearing loss. Paranoia or hallucinations were denied. He was referred for a comprehensive neuropsychological evaluation.  He was seen at [facility] Crista Luria, Ph.D.) for an evaluation on 07/24/2018. Unfortunately, this evaluation was performed virtually due to it taking place during the beginning of the COVID-19 pandemic. Due to the virtual nature, Mr. Bur only completed a brief cognitive screening measure (blind MOCA; 6/22) and a few mood-related questionnaires. Brief screening instruments like the MOCA are insufficient to assess the full extent of cognitive impairment and certainly do not offer strong information surrounding the etiology of cognitive decline. As such, results were inconclusive and a comprehensive in-person evaluation was recommended at a later date. I do not have records to suggest that a prior in-person evaluation took place.   During his most recent appointment with Dr. Lucia Gaskins on 12/03/2020, she noted that his folate was low and his PCP had asked him to supplement. He and his wife reported worsening memory, with added concerns given that his sister was recently diagnosed with dementia, most likely due to Alzheimer's disease. Performance on a brief cognitive screening instrument (MMSE) was 13/30. Ultimately, Mr. San was referred for a comprehensive neuropsychological evaluation to characterize his  cognitive abilities and to assist with diagnostic clarity and treatment planning.   No neuroimaging was available for review. However, a brain MRI appears to be scheduled to be completed tomorrow (12/16/2020).   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   Vitamin B12 deficiency anemia 12/04/2018   Word finding difficulty 10/21/2020    Past Surgical History:  Procedure Laterality Date   CARDIAC CATHETERIZATION      Current Outpatient Medications:    albuterol (PROVENTIL HFA;VENTOLIN HFA) 108 (90 Base) MCG/ACT inhaler, Inhale 1-2 puffs into the lungs every 6 (six) hours as needed for wheezing or shortness of breath., Disp: 18 g, Rfl: 3   Ascorbic Acid (VITAMIN C) 100 MG tablet, Take 100 mg by mouth daily., Disp: , Rfl:    aspirin EC 81 MG tablet, Take 81 mg by mouth daily., Disp: , Rfl:  atorvastatin (LIPITOR) 40 MG tablet, Take 1 tablet by mouth once daily, Disp: 90 tablet, Rfl: 1   cephALEXin (KEFLEX) 500 MG capsule, Take 1 capsule (500 mg total) by mouth 2 (two) times daily., Disp: 14 capsule, Rfl: 0   Cetirizine HCl (ZYRTEC PO), Take 10 mg by mouth daily., Disp: , Rfl:    cholecalciferol (VITAMIN D) 400 units TABS tablet, Take 400 Units by mouth daily., Disp: , Rfl:    citalopram (CELEXA) 10 MG tablet, Take 1 tablet by mouth once daily, Disp: 90 tablet, Rfl: 1   clobetasol cream (TEMOVATE) 0.05 %, Apply 1 application topically 2 (two) times daily as needed (rash)., Disp: 60 g, Rfl: 3   diclofenac sodium (VOLTAREN) 1 % GEL, Apply 2 g topically 4 (four) times daily as needed (pain). , Disp: , Rfl:    donepezil (ARICEPT) 5 MG tablet, Take 1 tablet (5 mg total) by mouth at bedtime., Disp: 30 tablet, Rfl: 4   EPINEPHrine (EPIPEN IJ), Inject as directed as directed., Disp: , Rfl:    ferrous sulfate 325 (65 FE) MG tablet, Take 325 mg by mouth daily with breakfast., Disp: , Rfl:    folic acid (FOLVITE) 1 MG tablet, Take 1 tablet (1 mg total) by mouth every other day., Disp: 45 tablet, Rfl: 1   lisinopril (ZESTRIL) 20 MG tablet, Take 1 tablet (20 mg total) by mouth daily., Disp: 90 tablet, Rfl: 1   Misc Natural Products (TART CHERRY ADVANCED PO), Take by mouth., Disp: , Rfl:    sildenafil (VIAGRA) 100 MG tablet, TAKE 1 TABLET BY MOUTH ONCE DAILY AS NEEDED FOR ERECTILE DYSFUNCTION, Disp: 5 tablet, Rfl: 1   TURMERIC PO, Take by mouth., Disp: , Rfl:   Clinical Interview:   The following information was obtained during a clinical interview with Bruce Jordan and his wife prior to cognitive testing.  Cognitive Symptoms: Decreased short-term memory: Endorsed. Examples included trouble recalling details of past conversations, names, and appointments. His wife also added that he will ask repetitive questions and require information be repeated to him several times. Memory dysfunction was said  to be present since around 2017 and has progressively worsened over time.  Decreased long-term memory: Denied. Decreased attention/concentration: Endorsed. He reported perhaps mild issues with distractibility. His wife added that he sometimes seems to be off in his own world and has trouble focusing or sustaining his attention. She also noted that he seems to have more trouble following movie or television show plots. Reduced processing speed: Endorsed. Difficulties with executive functions: Endorsed. He reported trouble with disorganization and indecision. He alluded to perhaps mild impulsivity. An example included him leaving the apartment abruptly after seeing something interesting outside. His wife denied any overt personality changes or evidence for disinhibition.  Difficulties with emotion regulation: Denied. Difficulties with receptive language: Denied. Difficulties with word finding: Endorsed. He reported commonly experiencing tip-of-the-tongue phenomenons. His wife noted that word finding has been a weakness as long as she has known him. However, she added that it does seem to have worsened during recent years.  Decreased visuoperceptual ability: Denied.  Difficulties completing ADLs: Somewhat. His wife manages his pillbox, with Bruce Jordan commenting that he would have difficulty doing this himself. Once organized, he did report being able to take his medication at appropriate times. His wife manages finances and bill paying; she has done this for the past 30 years. He continues to drive locally and denied instances getting lost in familiar surroundings.   Additional Medical History: History  of traumatic brain injury/concussion: Denied. History of stroke: Denied. History of seizure activity: Denied. History of known exposure to toxins: Denied. Symptoms of chronic pain: Endorsed. He reported mild, diffuse chronic pain, noting that symptoms were generally manageable. His shoulders represented  an area of particular emphasis.  Experience of frequent headaches/migraines: Endorsed. For the past 1-2 years, he and his wife reported frequent headaches. These symptoms were at least partially attributed to a longstanding mold concern in their apartment which they have been attempting to correct. She also noted that some of his medications can have headache symptoms as side effects. Other mold symptoms were said to include runny nose and watery eyes.  Frequent instances of dizziness/vertigo: Denied.  Sensory changes: He wears glasses with benefit. He also wears hearing aids. While these benefit him some, he did acknowledge that this benefit is not as significant as he would like. Other sensory changes/difficulties (e.g., taste or smell) were denied.  Balance/coordination difficulties: Denied. He also denied any recent falls.  Other motor difficulties: Denied.  Sleep History: Estimated hours obtained each night: 8 hours.  Difficulties falling asleep: Denied. Difficulties staying asleep: Denied. Feels rested and refreshed upon awakening: Endorsed.  History of snoring: Denied. History of waking up gasping for air: Denied. Witnessed breath cessation while asleep: Denied.  History of vivid dreaming: Denied. Excessive movement while asleep: Denied. Instances of acting out his dreams: Denied.  Psychiatric/Behavioral Health History: Depression: Mr. John described his current mood as "low key," stating that this was typical for him. His wife was in agreement with this. He denied to his knowledge any prior mental health concerns or diagnoses. Medical records do suggest a prior diagnosis of major depressive disorder back in 2016. However, these symptoms appear to be in full remission at the present time. Current or remote suicidal ideation, intent, or plan was denied.  Anxiety: Denied. Mania: Denied. Trauma History: Denied. Visual/auditory hallucinations: Denied. Delusional thoughts:  Denied.  Tobacco: Denied. Alcohol: He reported infrequent alcohol consumption in social settings. He denied a history of problematic alcohol abuse or dependence.  Recreational drugs: Denied.  Family History: Problem Relation Age of Onset   Hypertension Mother    Heart disease Mother    Heart disease Father    Cancer Father    Alzheimer's disease Sister    Dementia Sister    Early death Brother 57       MVA   Stroke Brother    Early death Brother 41       Epilepsy   This information was confirmed by Bruce Jordan.  Academic/Vocational History: Highest level of educational attainment: 12 years. 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 He noted that he "didn't care much" about school and that all subjects were relative weaknesses.  History of developmental delay: Denied. History of grade repetition: Denied. Enrollment in special education courses: Denied. History of LD/ADHD: Denied.  Employment: Retired. 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   Evaluation Results:   Behavioral Observations: Bruce Jordan was accompanied by his wife, arrived to his appointment on time, and was appropriately dressed and groomed. He appeared alert and oriented. Observed gait and station were within normal limits. Gross motor functioning appeared intact upon informal observation and no abnormal movements (e.g., tremors) were noted. His affect was generally relaxed and positive. Spontaneous speech was fluent and word finding difficulties were not observed during the clinical interview. Thought processes were coherent, organized, and normal in content. Hearing loss was not  a significant barrier during interview and only one question had to be repeated due to lack of clarity. Insight into his cognitive difficulties appeared somewhat limited in that the extent of cognitive impairment appears more significant and widespread than what Bruce Jordan likely  realizes. During testing, sustained attention was appropriate. Task engagement was adequate and he persisted when challenged. He did require instructions to be clarified or repeated, most often across tasks more complex in nature. Despite this, several of these tasks were discontinued due to him not understanding or comprehending what he was being asked to do. Overall, Bruce Jordan was cooperative with the clinical interview and subsequent testing procedures.   Adequacy of Effort: The validity of neuropsychological testing is limited by the extent to which the individual being tested may be assumed to have exerted adequate effort during testing. Bruce Jordan expressed his intention to perform to the best of his abilities and exhibited adequate task engagement and persistence. Scores across stand-alone and embedded performance validity measures were variable. However, his one below expectation performance is most likely due to true cognitive impairment rather than poor engagement or attempts to perform poorly. As such, the results of the current evaluation are believed to be a valid representation of Bruce Jordan current cognitive functioning.  Test Results: Mr. Paolucci was very poorly oriented at the time of the current evaluation. He incorrectly stated his age ("55") and was unable to state his current address (both street address and city). He originally reported living in [place] before correcting himself. He was also unable to state the current year ("2008"), month ("November"), date, day of the week, or time. He stated his belief that he was currently in [place] rather than [place].   Intellectual abilities based upon educational and vocational attainment were estimated to be in the average range. Premorbid abilities were estimated to be within the well below average range based upon a single-word reading test.   Processing speed was exceptionally low. Basic attention was below average to  average. More complex attention (e.g., working memory) was well below average. Executive functioning was exceptionally low. He also performed in the well below average range on a task assessing safety and judgment. Points were generally lost for overly vague responses rather than responses which were incorrect or concerning for poor judgment.   Assessed receptive language abilities were well below average. Greatest difficulties were seen understanding conceptual questions, understanding more complex sentence structure, and following multi-step commands. Assessed expressive language was somewhat variable. Verbal fluency (both phonemic and semantic) were exceptionally low. Confrontation naming was average across a screening instrument but well below average across a more comprehensive task.     Assessed visuospatial/visuoconstructional abilities were well below average. Across his drawing of a clock, there was notable difficulties with numerical placement spatially. He also included the number 10 twice. He appeared confused and was unable to place the clock hands. Points were lost on his copy of a complex figure due to fairly notable visual distortions, as well as one aspect being omitted entirely.    Learning (i.e., encoding) of novel verbal information was exceptionally low. Spontaneous delayed recall (i.e., retrieval) of previously learned information was also exceptionally low. Retention rates were 0% across a story learning task, 0% across a list learning task, and 0% across a figure drawing task. Performance across recognition tasks was mildly variable but overall below expectation, suggesting minimal evidence for information consolidation.   Results of emotional screening instruments suggested that recent symptoms of generalized anxiety were  in the minimal range, while symptoms of depression were within normal limits. A screening instrument assessing recent sleep quality suggested the presence of  minimal sleep dysfunction.  Tables of Scores:   Note: This summary of test scores accompanies the interpretive report and should not be considered in isolation without reference to the appropriate sections in the text. Descriptors are based on appropriate normative data and may be adjusted based on clinical judgment. Terms such as "Within Normal Limits" and "Outside Normal Limits" are used when a more specific description of the test score cannot be determined.       Percentile - Normative Descriptor > 98 - Exceptionally High 91-97 - Well Above Average 75-90 - Above Average 25-74 - Average 9-24 - Below Average 2-8 - Well Below Average < 2 - Exceptionally Low       Validity:   DESCRIPTOR       Dot Counting Test: --- --- Within Normal Limits  RBANS Effort Index: --- --- Outside Normal Limits  WAIS-IV Reliable Digit Span: --- --- Within Normal Limits       Orientation:      Raw Score Percentile   NAB Orientation, Form 1 13/29 --- ---       Cognitive Screening:      Raw Score Percentile   SLUMS: 7/30 --- ---       RBANS, Form A: Standard Score/ Scaled Score Percentile   Total Score 51 <1 Exceptionally Low  Immediate Memory 44 <1 Exceptionally Low    List Learning 2 <1 Exceptionally Low    Story Memory 1 <1 Exceptionally Low  Visuospatial/Constructional 66 1 Exceptionally Low    Figure Copy 4 2 Well Below Average    Line Orientation 10/20 3-9 Well Below Average  Language 74 4 Well Below Average    Picture Naming 9/10 26-50 Average    Semantic Fluency 1 <1 Exceptionally Low  Attention 64 1 Exceptionally Low    Digit Span 7 16 Below Average    Coding 2 <1 Exceptionally Low  Delayed Memory 40 <1 Exceptionally Low    List Recall 0/10 <2 Exceptionally Low    List Recognition 11/20 <2 Exceptionally Low    Story Recall 1 <1 Exceptionally Low    Story Recognition 8/12 8-15 Below Average    Figure Recall 1 <1 Exceptionally Low    Figure Recognition 3/8 4-8 Well Below Average        Intellectual Functioning:      Standard Score Percentile   Test of Premorbid Functioning: 74 4 Well Below Average       Attention/Executive Function:     Trail Making Test (TMT): Raw Score (T Score) Percentile     Part A 81 secs.,  0 errors (25) 1 Exceptionally Low    Part B Discontinued --- Impaired         Scaled Score Percentile   WAIS-IV Digit Span: 5 5 Well Below Average    Forward 9 37 Average    Backward 5 5 Well Below Average    Sequencing 4 2 Well Below Average        Scaled Score Percentile   WAIS-IV Similarities: 3 1 Exceptionally Low       D-KEFS Color-Word Interference Test: Raw Score (Scaled Score) Percentile     Color Naming 57 secs. (1) <1 Exceptionally Low    Word Reading 48 secs. (1) <1 Exceptionally Low    Inhibition 115 secs. (3) 1 Exceptionally Low      Total  Errors 22 errors (1) <1 Exceptionally Low    Inhibition/Switching 174 secs. (1) <1 Exceptionally Low      Total Errors 15 errors (1) <1 Exceptionally Low       D-KEFS Verbal Fluency Test: Raw Score (Scaled Score) Percentile     Letter Total Correct 3 (1) <1 Exceptionally Low    Category Total Correct 15 (3) 1 Exceptionally Low    Category Switching Total Correct 1 (1) <1 Exceptionally Low    Category Switching Accuracy 0 (1) <1 Exceptionally Low       NAB Executive Functions Module, Form 1: T Score Percentile     Judgment 33 5 Well Below Average       Language:     Verbal Fluency Test: Raw Score (T Score) Percentile     Phonemic Fluency (FAS) 3 (20) <1 Exceptionally Low    Animal Fluency 8 (27) 1 Exceptionally Low        NAB Language Module, Form 1: T Score Percentile     Auditory Comprehension 30 2 Well Below Average    Naming 26/31 (36) 8 Well Below Average       Visuospatial/Visuoconstruction:      Raw Score Percentile   Clock Drawing: 3/10 --- Impaired       Mood and Personality:      Raw Score Percentile   Geriatric Depression Scale: 5 --- Within Normal Limits  Geriatric Anxiety  Scale: 2 --- Minimal    Somatic 1 --- Minimal    Cognitive 1 --- Minimal    Affective 0 --- Minimal       Additional Questionnaires:      Raw Score Percentile   PROMIS Sleep Disturbance Questionnaire: 8 --- None to Slight   Informed Consent and Coding/Compliance:   The current evaluation represents a clinical evaluation for the purposes previously outlined by the referral source and is in no way reflective of a forensic evaluation.   Mr. Gratz was provided with a verbal description of the nature and purpose of the present neuropsychological evaluation. Also reviewed were the foreseeable risks and/or discomforts and benefits of the procedure, limits of confidentiality, and mandatory reporting requirements of this provider. The patient was given the opportunity to ask questions and receive answers about the evaluation. Oral consent to participate was provided by the patient.   This evaluation was conducted by Newman Nickels, Ph.D., licensed clinical neuropsychologist. Mr. Mcniel completed a clinical interview with Dr. Milbert Coulter, billed as one unit [phone], and 125 minutes of cognitive testing and scoring, billed as one unit [phone] and three additional units [id number]. Psychometrist Wallace Keller, B.S., assisted Dr. Milbert Coulter with test administration and scoring procedures. As a separate and discrete service, Dr. Milbert Coulter spent a total of 160 minutes in interpretation and report writing billed as one unit [phone] and two units [id number].

## 2020-12-16 ENCOUNTER — Ambulatory Visit
Admission: RE | Admit: 2020-12-16 | Discharge: 2020-12-16 | Disposition: A | Discharge status: Home / Self Care | Payer: Medicare Other | Source: Ambulatory Visit | Attending: Neurology | Admitting: Neurology

## 2020-12-16 ENCOUNTER — Encounter: Payer: Self-pay | Admitting: Psychology

## 2020-12-16 DIAGNOSIS — Z820 Family history of epilepsy and other diseases of the nervous system: Secondary | ICD-10-CM

## 2020-12-16 DIAGNOSIS — Z135 Encounter for screening for eye and ear disorders: Secondary | ICD-10-CM | POA: Diagnosis not present

## 2020-12-16 DIAGNOSIS — Z77018 Contact with and (suspected) exposure to other hazardous metals: Secondary | ICD-10-CM

## 2020-12-16 DIAGNOSIS — R413 Other amnesia: Secondary | ICD-10-CM | POA: Diagnosis not present

## 2020-12-16 DIAGNOSIS — F0390 Unspecified dementia without behavioral disturbance: Secondary | ICD-10-CM

## 2020-12-16 DIAGNOSIS — G309 Alzheimer's disease, unspecified: Secondary | ICD-10-CM

## 2020-12-16 MED ORDER — GADOBENATE DIMEGLUMINE 529 MG/ML IV SOLN
12.0000 mL | Freq: Once | INTRAVENOUS | Status: AC | PRN
  Administered 2020-12-16: 12 mL via INTRAVENOUS

## 2020-12-22 ENCOUNTER — Ambulatory Visit (INDEPENDENT_AMBULATORY_CARE_PROVIDER_SITE_OTHER): Payer: Medicare Other | Admitting: Psychology

## 2020-12-22 ENCOUNTER — Other Ambulatory Visit: Payer: Self-pay

## 2020-12-22 DIAGNOSIS — G309 Alzheimer's disease, unspecified: Secondary | ICD-10-CM | POA: Diagnosis not present

## 2020-12-22 DIAGNOSIS — F0280 Dementia in other diseases classified elsewhere without behavioral disturbance: Secondary | ICD-10-CM | POA: Diagnosis not present

## 2020-12-22 NOTE — Progress Notes (Signed)
   Neuropsychology Feedback Session Bruce Jordan. [facility] [place] Department of Neurology  Reason for Referral:   Bruce Jordan. is a 75 y.o. right-handed Caucasian male referred by  Naomie Dean, M.D. 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   Feedback:   Bruce Jordan completed a comprehensive neuropsychological evaluation on 12/15/2020. Please refer to that encounter for the full report and recommendations. Briefly, results suggested diffuse cognitive impairment generally in the exceptionally low normative ranges relative to age-matched peers. He exhibited a lone strength across basic attention. There was also some variability across confrontation naming in that he performed adequately on a screening task but had far greater difficulty across a more comprehensive task. All other cognitive domains exhibited severe impairment. Given the diffuse nature of significant cognitive impairment, the etiology of dysfunction is somewhat unclear. However, I do have concerns surrounding the presence of Alzheimer's disease. Across memory tasks, Bruce Jordan had significant trouble learning new information and did not benefit from repeated exposure. After a delay, he was fully amnestic (i.e., 0% retention scores) across all assessments. Performance across yes/no recognition tasks was mildly variable, but overall below expectation as he benefited from correct guessing at times. Overall, memory performance suggests a strong storage deficit, which is the hallmark characteristic of this disease process. His age, records reporting gradual, progressive memory decline over many years, and population base rates also would suggest Alzheimer's disease being most likely  Bruce Jordan was accompanied by his wife during the  current feedback session. Content of the current session focused on the results of his neuropsychological evaluation. Bruce Jordan and his wife were given the opportunity to ask questions and their questions were answered. They were encouraged to reach out should additional questions arise. A copy of his report was provided at the conclusion of the visit.      20 minutes were spent conducting the current feedback session with Bruce Jordan, billed as one unit [phone].

## 2020-12-24 ENCOUNTER — Other Ambulatory Visit: Payer: Self-pay | Admitting: Neurology

## 2020-12-24 MED ORDER — DONEPEZIL HCL 10 MG PO TABS: 10.0000 mg | ORAL_TABLET | Freq: Every day | ORAL | 3 refills | Status: DC

## 2020-12-28 ENCOUNTER — Other Ambulatory Visit: Payer: Self-pay | Admitting: Neurology

## 2020-12-28 MED ORDER — MEMANTINE HCL 5 MG PO TABS: 5.0000 mg | ORAL_TABLET | Freq: Two times a day (BID) | ORAL | 6 refills | Status: DC

## 2020-12-29 ENCOUNTER — Other Ambulatory Visit: Payer: Self-pay | Admitting: Neurology

## 2020-12-29 ENCOUNTER — Other Ambulatory Visit: Payer: Self-pay | Admitting: Internal Medicine

## 2020-12-29 DIAGNOSIS — I10 Essential (primary) hypertension: Secondary | ICD-10-CM

## 2020-12-29 DIAGNOSIS — I2510 Atherosclerotic heart disease of native coronary artery without angina pectoris: Secondary | ICD-10-CM

## 2020-12-29 MED ORDER — DONEPEZIL HCL 10 MG PO TABS: 10.0000 mg | ORAL_TABLET | Freq: Every day | ORAL | 3 refills | Status: DC

## 2021-01-05 ENCOUNTER — Telehealth: Payer: Self-pay | Admitting: Internal Medicine

## 2021-01-05 NOTE — Telephone Encounter (Signed)
Pt scheduled for 9/28 @ 9.20am

## 2021-01-05 NOTE — Telephone Encounter (Signed)
Patient was recently seen in ED for UTI  Patient was treated w/ anitbiotics  UTI has returned & patient wants to know if provider is willing to send over another antibiotic to pharmacy until he is able to be seen at specialist office next week  Pharmacy: [facility] [address] [place] - [address]  Phone:  [phone] Fax:  [phone]

## 2021-01-06 ENCOUNTER — Ambulatory Visit (INDEPENDENT_AMBULATORY_CARE_PROVIDER_SITE_OTHER): Payer: Medicare Other | Admitting: Internal Medicine

## 2021-01-06 ENCOUNTER — Encounter: Payer: Self-pay | Admitting: Internal Medicine

## 2021-01-06 ENCOUNTER — Other Ambulatory Visit: Payer: Self-pay

## 2021-01-06 VITALS — BP 136/84 | HR 82 | Temp 97.9°F | Resp 16 | Ht 65.0 in

## 2021-01-06 DIAGNOSIS — N410 Acute prostatitis: Secondary | ICD-10-CM | POA: Diagnosis not present

## 2021-01-06 DIAGNOSIS — R310 Gross hematuria: Secondary | ICD-10-CM | POA: Insufficient documentation

## 2021-01-06 DIAGNOSIS — I2510 Atherosclerotic heart disease of native coronary artery without angina pectoris: Secondary | ICD-10-CM | POA: Diagnosis not present

## 2021-01-06 MED ORDER — SULFAMETHOXAZOLE-TRIMETHOPRIM 800-160 MG PO TABS
1.0000 | ORAL_TABLET | Freq: Two times a day (BID) | ORAL | 0 refills | Status: DC
Start: 2021-01-06 — End: 2021-01-20

## 2021-01-06 NOTE — Patient Instructions (Signed)

## 2021-01-06 NOTE — Progress Notes (Signed)
Subjective:  Patient ID: Bruce Jordan., male    DOB: [birth year]  Age: 75 y.o. MRN: [id number]  CC: Urinary Tract Infection  This visit occurred during the SARS-CoV-2 public health emergency.  Safety protocols were in place, including screening questions prior to the visit, additional usage of staff PPE, and extensive cleaning of exam room while observing appropriate contact time as indicated for disinfecting solutions.    HPI Maanav Kassabian. presents for f/up - He was seen in the ED about a month ago for hematuria.  He was treated with a course of cephalexin.  His urine color clx was positive for Enterobacter Aerogenes.  He continues to complain of hematuria and bladder pain.  Outpatient Medications Prior to Visit  Medication Sig Dispense Refill   albuterol (PROVENTIL HFA;VENTOLIN HFA) 108 (90 Base) MCG/ACT inhaler Inhale 1-2 puffs into the lungs every 6 (six) hours as needed for wheezing or shortness of breath. 18 g 3   Ascorbic Acid (VITAMIN C) 100 MG tablet Take 100 mg by mouth daily.     aspirin EC 81 MG tablet Take 81 mg by mouth daily.     atorvastatin (LIPITOR) 40 MG tablet Take 1 tablet by mouth once daily 90 tablet 1   Cetirizine HCl (ZYRTEC PO) Take 10 mg by mouth daily.     cholecalciferol (VITAMIN D) 400 units TABS tablet Take 400 Units by mouth daily.     citalopram (CELEXA) 10 MG tablet Take 1 tablet by mouth once daily 90 tablet 1   clobetasol cream (TEMOVATE) 0.05 % Apply 1 application topically 2 (two) times daily as needed (rash). 60 g 3   diclofenac sodium (VOLTAREN) 1 % GEL Apply 2 g topically 4 (four) times daily as needed (pain).      donepezil (ARICEPT) 10 MG tablet Take 1 tablet (10 mg total) by mouth at bedtime. 90 tablet 3   EPINEPHrine (EPIPEN IJ) Inject as directed as directed.     ferrous sulfate 325 (65 FE) MG tablet Take 325 mg by mouth daily with breakfast.     folic acid (FOLVITE) 1 MG tablet Take 1 tablet (1 mg total) by mouth every other day. 45  tablet 1   lisinopril (ZESTRIL) 20 MG tablet Take 1 tablet by mouth once daily 90 tablet 0   memantine (NAMENDA) 5 MG tablet Take 1 tablet (5 mg total) by mouth 2 (two) times daily. 60 tablet 6   Misc Natural Products (TART CHERRY ADVANCED PO) Take by mouth.     sildenafil (VIAGRA) 100 MG tablet TAKE 1 TABLET BY MOUTH ONCE DAILY AS NEEDED FOR ERECTILE DYSFUNCTION 5 tablet 1   TURMERIC PO Take by mouth.     cephALEXin (KEFLEX) 500 MG capsule Take 1 capsule (500 mg total) by mouth 2 (two) times daily. 14 capsule 0   No facility-administered medications prior to visit.    ROS Review of Systems  Constitutional:  Negative for chills, fatigue and fever.  HENT: Negative.    Eyes: Negative.   Respiratory:  Negative for cough and shortness of breath.   Cardiovascular:  Negative for chest pain, palpitations and leg swelling.  Gastrointestinal:  Negative for abdominal pain, constipation, diarrhea and nausea.  Genitourinary:  Positive for dysuria and hematuria. Negative for difficulty urinating, flank pain, frequency, scrotal swelling and urgency.  Musculoskeletal: Negative.   Skin: Negative.   Neurological: Negative.  Negative for dizziness and weakness.  Hematological:  Negative for adenopathy. Does not bruise/bleed easily.  Psychiatric/Behavioral:  Negative.     Objective:  BP 136/84 (BP Location: Left Arm, Patient Position: Sitting, Cuff Size: Large)   Pulse 82   Temp 97.9 F (36.6 C) (Oral)   Resp 16   Ht 5\' 5"  (1.651 m)   SpO2 98%   BMI 22.96 kg/m   BP Readings from Last 3 Encounters:  01/06/21 136/84  12/05/20 (!) 146/76  12/03/20 (!) 143/75    Wt Readings from Last 3 Encounters:  12/04/20 138 lb (62.6 kg)  12/03/20 139 lb (63 kg)  10/20/20 138 lb (62.6 kg)    Physical Exam Vitals reviewed.  HENT:     Nose: Nose normal.     Mouth/Throat:     Mouth: Mucous membranes are moist.  Eyes:     Conjunctiva/sclera: Conjunctivae normal.  Cardiovascular:     Rate and Rhythm:  Normal rate and regular rhythm.     Heart sounds: No murmur heard. Pulmonary:     Effort: Pulmonary effort is normal.     Breath sounds: No stridor. No wheezing, rhonchi or rales.  Abdominal:     General: Abdomen is flat.     Palpations: There is no hepatomegaly, splenomegaly or mass.     Tenderness: There is abdominal tenderness in the suprapubic area.     Hernia: There is no hernia in the left inguinal area or right inguinal area.  Genitourinary:    Pubic Area: No rash.      Penis: Normal and circumcised.      Testes: Normal.        Right: Mass, tenderness or swelling not present.        Left: Mass, tenderness or swelling not present.     Epididymis:     Right: Normal. Not inflamed or enlarged. No mass.     Left: Normal. Not inflamed or enlarged. No mass.     Prostate: Enlarged and tender. No nodules present.     Rectum: Guaiac result negative. Internal hemorrhoid present. No mass, tenderness, anal fissure or external hemorrhoid. Normal anal tone.  Musculoskeletal:        General: Normal range of motion.     Cervical back: Neck supple.  Lymphadenopathy:     Cervical: No cervical adenopathy.     Lower Body: No right inguinal adenopathy. No left inguinal adenopathy.  Skin:    General: Skin is warm and dry.  Neurological:     General: No focal deficit present.     Mental Status: He is alert.    Lab Results  Component Value Date   WBC 13.9 (H) 12/04/2020   HGB 14.5 12/04/2020   HCT 43.7 12/04/2020   PLT 154 12/04/2020   GLUCOSE 140 (H) 12/04/2020   CHOL 153 11/12/2019   TRIG 141 11/12/2019   HDL 48 11/12/2019   LDLDIRECT 77.0 10/04/2017   LDLCALC 81 11/12/2019   ALT 24 12/04/2020   AST 31 12/04/2020   NA 136 12/04/2020   K 3.6 12/04/2020   CL 105 12/04/2020   CREATININE 1.42 (H) 12/04/2020   BUN 17 12/04/2020   CO2 22 12/04/2020   TSH 3.57 11/12/2019   PSA 3.49 12/04/2018   INR 1.0 12/04/2020   HGBA1C 5.4 11/12/2019    DG Eye Foreign Body  Result Date:  12/16/2020 CLINICAL DATA:  Metal working/exposure; clearance prior to MRI EXAM: ORBITS FOR FOREIGN BODY - 2 VIEW COMPARISON:  None. FINDINGS: There is no evidence of metallic foreign body within the orbits. No significant bone abnormality identified. IMPRESSION:  No evidence of metallic foreign body within the orbits. Electronically Signed   By: Duanne Guess D.O.   On: 12/16/2020 11:45   MR BRAIN W WO CONTRAST  Result Date: 12/18/2020 [facility] NEUROIMAGING REPORT STUDY DATE: 12/16/20 PATIENT NAME: Jarrett Albor. DOB: 1945-10-29 MRN: [id number] ORDERING CLINICIAN: Anson Fret, MD CLINICAL HISTORY: 75 year old male with memory loss. EXAM: MR BRAIN W WO CONTRAST TECHNIQUE: MRI of the brain with and without contrast was obtained utilizing 5 mm axial slices with T1, T2, T2 flair, SWI and diffusion weighted views.  T1 sagittal, T2 coronal and postcontrast views in the axial and coronal plane were obtained. CONTRAST: 24ml multihance COMPARISON: none IMAGING SITE: [facility] [address] (1.5 Tesla MRI)  FINDINGS: No abnormal lesions are seen on diffusion-weighted views to suggest acute ischemia. The cortical sulci, fissures and cisterns are notable for moderate perisylvian and mild frontal atrophy. Lateral, third and fourth ventricle are normal in size and appearance. No extra-axial fluid collections are seen. No evidence of mass effect or midline shift.  Few periventricular and subcortical foci of non-specific T2 hyperintensities. No abnormal lesions are seen on post contrast views.  On sagittal views the posterior fossa, pituitary gland and corpus callosum are unremarkable. No evidence of intracranial hemorrhage on SWI views. The orbits and their contents, paranasal sinuses and calvarium are unremarkable.  Intracranial flow voids are present.   MRI brain (with and without) demonstrating; - Moderate perisylvian and mild frontal atrophy. - No acute findings. INTERPRETING  PHYSICIAN: Suanne Marker, MD Certified in Neurology, Neurophysiology and Neuroimaging [facility] [address] [place], [place] [address] [phone]   Component 1 mo ago   Specimen Description URINE, CLEAN CATCH   Special Requests NONE  Performed at [facility], [address], [place], [place] [address]   Culture >=100,000 COLONIES/mL ENTEROBACTER AEROGENES Abnormal    Report Status 12/07/2020 FINAL   Organism ID, Bacteria ENTEROBACTER AEROGENES Abnormal    Resulting Agency [facility]     Susceptibility   Enterobacter aerogenes    MIC    CEFAZOLIN RESISTANT  Resistant    CEFEPIME <=0.12 SENS... Sensitive    CEFTRIAXONE <=0.25 SENS... Sensitive    CIPROFLOXACIN <=0.25 SENS... Sensitive    GENTAMICIN <=1 SENSITIVE  Sensitive    IMIPENEM 1 SENSITIVE  Sensitive    NITROFURANTOIN 64 INTERMED... Intermediate    PIP/TAZO <=4 SENSITIVE  Sensitive    TRIMETH/SULFA <=20 SENSIT... Sensitive                 Imaging Results (Last 48 hours)[] Expand by Default  CT Renal Stone Study   Result Date: 12/04/2020 CLINICAL DATA:  Hematuria. EXAM: CT ABDOMEN AND PELVIS WITHOUT CONTRAST TECHNIQUE: Multidetector CT imaging of the abdomen and pelvis was performed following the standard protocol without IV contrast. COMPARISON:  None. FINDINGS: Evaluation of this exam is limited in the absence of intravenous contrast. Lower chest: The visualized lung bases are clear. No intra-abdominal free air or free fluid. Hepatobiliary: Subcentimeter right hepatic hypodense focus is too small characterize. The liver is otherwise unremarkable. No intrahepatic biliary ductal dilatation. The gallbladder is unremarkable. Pancreas: Unremarkable. No pancreatic ductal dilatation or surrounding inflammatory changes. Spleen: Normal in size without focal abnormality. Adrenals/Urinary Tract: The adrenal glands are unremarkable there is no hydronephrosis or nephrolithiasis on either side.  There is a 3.5 cm exophytic lesion from the inferior pole of the left kidney which demonstrates fluid attenuation  most consistent. Additional 15 mm hypodense lesion from the upper pole of the right kidney, also likely a cyst. The visualized ureters appear unremarkable. The urinary bladder is collapsed. Stomach/Bowel: There is severe sigmoid diverticulosis without active inflammatory changes. Scattered colonic diverticula. There is no bowel obstruction or active inflammation. There is a 3.7 cm proximal duodenal diverticulum and a 2.5 cm distal duodenal diverticulum. There is loose stool throughout the colon compatible with diarrheal state. Correlation with clinical exam and stool cultures recommended. The appendix is normal. Vascular/Lymphatic: Moderate aortoiliac atherosclerotic disease. The IVC is unremarkable. No portal venous gas. There is no adenopathy. Reproductive: Enlarged prostate gland with median lobe hypertrophy indenting the base of the bladder. The prostate gland measures 5.3 cm in transverse axial diameter. The seminal vesicles are symmetric. Other: Ventral hernia repair mesh. Musculoskeletal: Osteopenia with degenerative changes of the spine. No acute osseous pathology. IMPRESSION: 1. No hydronephrosis or nephrolithiasis. 2. Diarrheal state. Correlation with clinical exam and stool cultures recommended. No bowel obstruction. Normal appendix. 3. Colonic diverticulosis. 4. Enlarged prostate gland with median lobe hypertrophy indenting the base of the bladder. 5. Aortic Atherosclerosis (ICD10-I70.0). Electronically Signed   By: Elgie Collard M.D.   On: 12/04/2020 23:36        Assessment & Plan:   Karey was seen today for urinary tract infection.  Diagnoses and all orders for this visit:  Gross hematuria- Will recheck the UA and will reculture. -     Urine drugs of abuse scrn w alc, routine (Ref Lab); Future -     CULTURE, URINE COMPREHENSIVE; Future -     CULTURE, URINE COMPREHENSIVE -      Urine drugs of abuse scrn w alc, routine (Ref Lab)  Acute prostatitis with hematuria- The Enterobacter species is sensitive to Bactrim DS. -     sulfamethoxazole-trimethoprim (BACTRIM DS) 800-160 MG tablet; Take 1 tablet by mouth 2 (two) times daily. -     Urine drugs of abuse scrn w alc, routine (Ref Lab); Future -     CULTURE, URINE COMPREHENSIVE; Future -     CULTURE, URINE COMPREHENSIVE -     Urine drugs of abuse scrn w alc, routine (Ref Lab)  I have discontinued Greig Castilla [name] "Andy"'s cephALEXin. I am also having him start on sulfamethoxazole-trimethoprim. Additionally, I am having him maintain his aspirin EC, cholecalciferol, diclofenac sodium, ferrous sulfate, vitamin C, albuterol, EPINEPHrine (EPIPEN IJ), Cetirizine HCl (ZYRTEC PO), Misc Natural Products (TART CHERRY ADVANCED PO), TURMERIC PO, sildenafil, clobetasol cream, folic acid, citalopram, atorvastatin, memantine, lisinopril, and donepezil.  Meds ordered this encounter  Medications   sulfamethoxazole-trimethoprim (BACTRIM DS) 800-160 MG tablet    Sig: Take 1 tablet by mouth 2 (two) times daily.    Dispense:  60 tablet    Refill:  0     Follow-up: Return in about 6 weeks (around 02/17/2021).  Sanda Linger, MD

## 2021-01-08 LAB — URINE DRUGS OF ABUSE SCREEN W ALC, ROUTINE (REF LAB)
Amphetamines, Urine: NEGATIVE ng/mL
Barbiturate Quant, Ur: NEGATIVE ng/mL
Benzodiazepine Quant, Ur: NEGATIVE ng/mL
Cannabinoid Quant, Ur: NEGATIVE ng/mL
Cocaine (Metab.): NEGATIVE ng/mL
Ethanol, Urine: NEGATIVE %
Methadone Screen, Urine: NEGATIVE ng/mL
Opiate Quant, Ur: NEGATIVE ng/mL
PCP Quant, Ur: NEGATIVE ng/mL
Propoxyphene: NEGATIVE ng/mL

## 2021-01-08 LAB — CULTURE, URINE COMPREHENSIVE

## 2021-01-14 DIAGNOSIS — R350 Frequency of micturition: Secondary | ICD-10-CM | POA: Diagnosis not present

## 2021-01-14 DIAGNOSIS — R351 Nocturia: Secondary | ICD-10-CM | POA: Diagnosis not present

## 2021-01-14 DIAGNOSIS — R310 Gross hematuria: Secondary | ICD-10-CM | POA: Diagnosis not present

## 2021-01-17 NOTE — Progress Notes (Signed)
Cardiology Office Note    Date:  01/20/2021   ID:  Bruce Macon., DOB Jan 24, 1946, MRN [id number]  PCP:  Etta Grandchild, MD  Cardiologist:  Timya Trimmer [place], MD    History of Present Illness:  Bruce Wolbert. is a 75 y.o. male seen for follow up CAD. He has a history of CAD s/p CABG in 2008 in [place] for failed PCI. This was a LIMA to the LAD. In 2013 he had PCI of a tortuous RCA. In April 2016 he had a low risk Cardiolite without evidence of ischemia. He has a history of HTN and dyslipidemia. 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  He moved to [place] to be closer to family. He lives in  an Apartment with his wife.  He reports he is doing well.  He denies any chest pain, palpitations, dizziness. He does note some SOB. He has dementia and is now on Aricept and Namenda. He tries to walk some daily.    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 18 g 3   Ascorbic Acid (VITAMIN C) 100 MG tablet Take 100 mg by mouth daily.     aspirin EC 81 MG tablet Take 81 mg by mouth daily.     atorvastatin (LIPITOR) 40 MG tablet Take 1 tablet by mouth once daily 90 tablet 1   Cetirizine HCl (ZYRTEC PO) Take 10 mg by mouth daily.     cholecalciferol (VITAMIN D) 400 units TABS tablet Take 400 Units by mouth daily.     citalopram (CELEXA) 10 MG tablet Take 1 tablet by mouth once daily 90 tablet 1   clobetasol cream (TEMOVATE) 0.05 % Apply 1 application topically 2 (two) times daily as needed (rash). 60 g 3   diclofenac sodium (VOLTAREN) 1 % GEL Apply 2 g topically 4 (four) times daily as needed (pain).      donepezil (ARICEPT)  10 MG tablet Take 1 tablet (10 mg total) by mouth at bedtime. 90 tablet 3   EPINEPHrine (EPIPEN IJ) Inject as directed as directed.     ferrous sulfate 325 (65 FE) MG tablet Take 325 mg by mouth daily with breakfast.     folic acid (FOLVITE) 1 MG tablet Take 1 tablet (1 mg total) by mouth every other day. 45 tablet 1   lisinopril (ZESTRIL) 20 MG tablet Take 1 tablet by mouth once daily 90 tablet 0   memantine (NAMENDA) 5 MG tablet Take 1 tablet (5 mg total) by mouth 2 (two) times daily. 60 tablet 6   Misc Natural Products (TART CHERRY ADVANCED PO) Take by mouth.     sildenafil (VIAGRA) 100 MG tablet TAKE 1 TABLET BY MOUTH ONCE DAILY AS NEEDED  FOR ERECTILE DYSFUNCTION 5 tablet 1   TURMERIC PO Take by mouth.     sulfamethoxazole-trimethoprim (BACTRIM DS) 800-160 MG tablet Take 1 tablet by mouth 2 (two) times daily. 60 tablet 0   No facility-administered medications prior to visit.     Allergies:   Patient has no known allergies.   Social History   Socioeconomic History   Marital status: Married    Spouse name: Not on file   Number of children: Not on file   Years of education: 12   Highest education level: High school graduate  Occupational History   Occupation: Retired    Comment: Mechanic/maintenance  Tobacco Use   Smoking status: Former   Smokeless tobacco: Never  Building services engineer Use: Never used  Substance and Sexual Activity   Alcohol use: Yes    Comment: a cocktail once in a while   Drug use: No   Sexual activity: Yes    Birth control/protection: None  Other Topics Concern   Not on file  Social History Narrative   Lives at home with wife    Right handed   Caffeine: 2 cups/day of coke    Social Determinants of Health   Financial Resource Strain: Low Risk    Difficulty of Paying Living Expenses: Not hard at all  Food Insecurity: No Food Insecurity   Worried About Programme researcher, broadcasting/film/video in the Last Year: Never true   Barista in the Last Year: Never true  Transportation Needs: No Transportation Needs   Lack of Transportation (Medical): No   Lack of Transportation (Non-Medical): No  Physical Activity: Sufficiently Active   Days of Exercise per Week: 5 days   Minutes of Exercise per Session: 30 min  Stress: No Stress Concern Present   Feeling of Stress : Not at all  Social Connections: Socially Integrated   Frequency of Communication with Friends and Family: More than three times a week   Frequency of Social Gatherings with Friends and Family: Three times a week   Attends Religious Services: More than 4 times per year   Active Member of Clubs or Organizations: No   Attends Museum/gallery exhibitions officer: More than 4 times per year   Marital Status: Married     Family History:  The patient's family history includes Alzheimer's disease in his sister; Cancer in his father; Dementia in his sister; Early death (age of onset: 64) in his brother; Early death (age of onset: 80) in his brother; Heart disease in his father and mother; Hypertension in his mother; Stroke in his brother.   ROS:   Please see the history of present illness.  ROS All other systems reviewed and are negative.   PHYSICAL EXAM:   VS:  BP 120/62 (BP Location: Right Arm)   Pulse (!) 51   Ht 5\' 5"  (1.651 m)   Wt 133 lb 3.2 oz (60.4 kg)   SpO2 98%   BMI 22.17 kg/m    GENERAL:  Well appearing WM in NAD HEENT:  PERRL, EOMI, sclera are clear. Oropharynx is clear. NECK:  No jugular venous distention, carotid upstroke brisk and symmetric, no bruits, no thyromegaly or adenopathy LUNGS:  Clear to auscultation bilaterally CHEST:  Unremarkable HEART:  RRR with extrasystoles,  PMI not displaced or sustained,S1 and S2 within normal limits, no S3, no S4: no clicks, no rubs, no murmurs ABD:  Soft, nontender. BS +, no masses or bruits. No hepatomegaly, no splenomegaly EXT:  2 + pulses throughout, no edema, no cyanosis no clubbing SKIN:  Warm and dry.  No rashes NEURO:  Alert and oriented x 3. Cranial nerves II through XII intact. PSYCH:  Cognitively intact   Wt Readings from Last 3 Encounters:  01/20/21 133 lb 3.2 oz (60.4 kg)  12/04/20 138 lb (62.6 kg)  12/03/20 139 lb (63 kg)      Studies/Labs Reviewed:   EKG:  EKG is  ordered today.  Sinus brady with rate 51,  LAD.I have personally reviewed and interpreted this study.    Recent Labs: 12/04/2020: ALT 24; BUN 17; Creatinine, Ser 1.42; Hemoglobin 14.5; Platelets 154; Potassium 3.6; Sodium 136   Lipid Panel    Component Value Date/Time   CHOL 153 11/12/2019 0932   TRIG 141 11/12/2019 0932   HDL 48 11/12/2019 0932   CHOLHDL 3.2 11/12/2019 0932    VLDL 35.8 12/04/2018 1534   LDLCALC 81 11/12/2019 0932   LDLDIRECT 77.0 10/04/2017 1607    Additional studies/ records that were reviewed today include:  Labs dated 08/24/15: glucose 177, creatinine 1.47. Otherwise CMET normal. Platelets 118K, otherwise CBC is normal. A1c 5.9%. TSH normal. Cholesterol 170, triglycerides 119, LDL 85, HDL 61. LDL particle number [id number]. Dated 05/26/16: cholesterol 144, triglycerides 98, HDL 62, LDL 62. A1c 5.7%. Creatinine 1.27. Hgb, TSH, ALT normal. Dated 10/04/17: cholesterol 162, triglycerides 240, HDL 70, LDL 77. A1c 5.7%. Creatinine 1.41. ALT normal. CBC normal.   ASSESSMENT:   CAD s/p CABG in 2008. Last Myoview in 2016 looked good. He is asymptomatic. Continue medical therapy with ASA, statin. HTN well controlled continue  lisinopril Hypercholesterolemia. On high dose statin. Will check labs today CKD stage 3. Stable  Dementia - followed by Neurology.  Follow up in one year.    Medication Adjustments/Labs and Tests Ordered: Current medicines are reviewed at length with the patient today.  Concerns regarding medicines are outlined above.  Medication changes, Labs and Tests ordered today are listed in the Patient Instructions below. There are no Patient Instructions on file for this visit.   Signed, Rance Smithson 2017, MD  01/20/2021 8:16 AM    [facility] [address], [place], [place], [place] [phone]

## 2021-01-20 ENCOUNTER — Other Ambulatory Visit: Payer: Self-pay

## 2021-01-20 ENCOUNTER — Encounter: Payer: Self-pay | Admitting: Cardiology

## 2021-01-20 ENCOUNTER — Ambulatory Visit (INDEPENDENT_AMBULATORY_CARE_PROVIDER_SITE_OTHER): Payer: Medicare Other | Admitting: Cardiology

## 2021-01-20 VITALS — BP 120/62 | HR 51 | Ht 65.0 in | Wt 133.2 lb

## 2021-01-20 DIAGNOSIS — E785 Hyperlipidemia, unspecified: Secondary | ICD-10-CM | POA: Diagnosis not present

## 2021-01-20 DIAGNOSIS — I2510 Atherosclerotic heart disease of native coronary artery without angina pectoris: Secondary | ICD-10-CM

## 2021-01-20 DIAGNOSIS — I25118 Atherosclerotic heart disease of native coronary artery with other forms of angina pectoris: Secondary | ICD-10-CM | POA: Diagnosis not present

## 2021-01-20 DIAGNOSIS — I10 Essential (primary) hypertension: Secondary | ICD-10-CM | POA: Diagnosis not present

## 2021-01-20 LAB — BASIC METABOLIC PANEL
BUN/Creatinine Ratio: 14 (ref 10–24)
BUN: 23 mg/dL (ref 8–27)
CO2: 21 mmol/L (ref 20–29)
Calcium: 9.4 mg/dL (ref 8.6–10.2)
Chloride: 102 mmol/L (ref 96–106)
Creatinine, Ser: 1.59 mg/dL — ABNORMAL HIGH (ref 0.76–1.27)
Glucose: 101 mg/dL — ABNORMAL HIGH (ref 70–99)
Potassium: 4.8 mmol/L (ref 3.5–5.2)
Sodium: 136 mmol/L (ref 134–144)
eGFR: 44 mL/min/{1.73_m2} — ABNORMAL LOW (ref >59)

## 2021-01-20 LAB — HEPATIC FUNCTION PANEL
ALT: 15 IU/L (ref 0–44)
AST: 21 IU/L (ref 0–40)
Albumin: 4.5 g/dL (ref 3.7–4.7)
Alkaline Phosphatase: 104 IU/L (ref 44–121)
Bilirubin Total: 0.4 mg/dL (ref 0.0–1.2)
Bilirubin, Direct: 0.14 mg/dL (ref 0.00–0.40)
Total Protein: 6.5 g/dL (ref 6.0–8.5)

## 2021-01-20 LAB — LIPID PANEL
Chol/HDL Ratio: 2.4 ratio (ref 0.0–5.0)
Cholesterol, Total: 107 mg/dL (ref 100–199)
HDL: 45 mg/dL (ref >39)
LDL Chol Calc (NIH): 46 mg/dL (ref 0–99)
Triglycerides: 82 mg/dL (ref 0–149)
VLDL Cholesterol Cal: 16 mg/dL (ref 5–40)

## 2021-01-31 DIAGNOSIS — Z23 Encounter for immunization: Secondary | ICD-10-CM | POA: Diagnosis not present

## 2021-02-12 DIAGNOSIS — H5203 Hypermetropia, bilateral: Secondary | ICD-10-CM | POA: Diagnosis not present

## 2021-02-12 DIAGNOSIS — H353131 Nonexudative age-related macular degeneration, bilateral, early dry stage: Secondary | ICD-10-CM | POA: Diagnosis not present

## 2021-04-05 ENCOUNTER — Other Ambulatory Visit: Payer: Self-pay | Admitting: Internal Medicine

## 2021-04-05 DIAGNOSIS — I10 Essential (primary) hypertension: Secondary | ICD-10-CM

## 2021-04-05 DIAGNOSIS — I2510 Atherosclerotic heart disease of native coronary artery without angina pectoris: Secondary | ICD-10-CM

## 2021-04-19 DIAGNOSIS — R102 Pelvic and perineal pain: Secondary | ICD-10-CM | POA: Diagnosis not present

## 2021-04-26 ENCOUNTER — Encounter: Payer: Self-pay | Admitting: Internal Medicine

## 2021-04-26 ENCOUNTER — Other Ambulatory Visit: Payer: Self-pay | Admitting: Internal Medicine

## 2021-04-26 DIAGNOSIS — D520 Dietary folate deficiency anemia: Secondary | ICD-10-CM

## 2021-04-26 MED ORDER — FOLIC ACID 1 MG PO TABS: 1.0000 mg | ORAL_TABLET | ORAL | 1 refills | Status: DC

## 2021-04-29 ENCOUNTER — Encounter: Payer: Self-pay | Admitting: Adult Health

## 2021-04-29 ENCOUNTER — Ambulatory Visit (INDEPENDENT_AMBULATORY_CARE_PROVIDER_SITE_OTHER): Payer: Medicare Other | Admitting: Adult Health

## 2021-04-29 ENCOUNTER — Other Ambulatory Visit: Payer: Self-pay

## 2021-04-29 VITALS — BP 143/74 | HR 55 | Ht 65.0 in | Wt 132.6 lb

## 2021-04-29 DIAGNOSIS — F0280 Dementia in other diseases classified elsewhere without behavioral disturbance: Secondary | ICD-10-CM

## 2021-04-29 DIAGNOSIS — G309 Alzheimer's disease, unspecified: Secondary | ICD-10-CM | POA: Diagnosis not present

## 2021-04-29 NOTE — Patient Instructions (Addendum)
Your Plan:  Continue Aricept 10 mg at bedtime Increase Namenda 5 mg in a.m. and 10 mg in the p.m. for the next week if tolerating okay then increase to 10 mg twice a day thereafter  Thank you for coming to see [place] at [facility]. I hope we have been able to provide you high quality care today.  You may receive a patient satisfaction survey over the next few weeks. We would appreciate your feedback and comments so that we may continue to improve ourselves and the health of our patients.

## 2021-04-29 NOTE — Progress Notes (Signed)
PATIENT: Marily Lente. DOB: 28-Aug-1945  REASON FOR VISIT: follow up HISTORY FROM: patient PRIMARY NEUROLOGIST: Dr. Jaynee Eagles  HISTORY OF PRESENT ILLNESS: Today 04/29/21:  Mr. 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  He returns today with his wife.  He continues to live at home with her.  Reports that he is able to complete all ADLs independently.  He completes household chores.  She does help him with his medications and appointments.  Denies any trouble sleeping.  No change in mood or behavior.  He continues to operate a motor vehicle but reports that he only drives to places close to where he lives.  His wife notes that she has noticed no issues with his driving.  He returns today for an evaluation.  HISTORY (copied from Dr. Cathren Laine note)  Interval history 12/03/2020: At last appointment we found B12 133. I don't see an MRI completed. Last B12 normal 10/2020. Folate was low however at 4.7. bmp with slightly reduced gfr. Cbc unremarkable except plts 140 and lymphopenia. He did not follow up after last appointment. PCP has addressed the folate and asked him to supplement. Memory is getting worse. Repeating the formal memory testing. Dr. Alphonzo Severance. MRI brain. Sister recently diagnosed with dementia, they think Alzheimers. He is still very social, walks every day, no falls, no physical changes, no depression. See prior extensive History below.    HPI 06/21/2018:  Zamar Hooven. is a 76 y.o. male here as requested by Janith Lima, MD for memory loss. PMHx osteoarthritis, impaired fasting glucose, hypertension, asthma, bradycardia, depression, hyperlipidemia, coronary artery disease, chronic ischemic heart disease.  He is here with his wife who provides much information.He doesnt remember things he used to remember. Started 3 years and slowly progressive. He was working in [place] and when he came home he had a difficult  time using the remote. It is more short-term memory. If someone tells him something he forgets, such as conversations, forgets appointments, he remembers birthdays, wife has always taken care of the bills for 33 years, he is still driving , no accidents or getting lost, he repeats questions and stories in the same day such as what time they are closing will ask 4-5 times in one day. His coworker has noticed. He doesnt know his family history and unclear if there is a history of dementia, he has hearing impairment. More short-term memory loss. He has an older sister without any memory loss. They have 1-2 glasses of alcohol a day. Still having difficulty with the remote but it is new, he still works with tools, he cooks, he makes lunch and brings it to his wife, he will make grilled cheese, he is more beligerant than he used to be, he works at [employer] and he will be more irritable and confrontational but not inappropriate behavior, no delusions or hallucinations. He feels hearing loss may be a factor as well. He is on an antidepressant and 3 years ago he had weight loss and wanted to sleep all the time and he feels better he does not feel sad most of the time. Socially nothing has changed. He takes his own medications but on a holiday it confused him when his edication was not in the right spots and wife had to help.No significant history of smoking. He feels refreshed in the morning, no snoring. wife provides much information. No other focal neurologic deficits, associated symptoms, inciting  events or modifiable factors.   Reviewed notes, labs and imaging from outside physicians, which showed:   Reviewed Dr. Ronnald Ramp notes.  Patient and wife complaining about short-term memory and has been declining over the last few years.  Denied any other associated events REVIEW OF SYSTEMS: Out of a complete 14 system review of symptoms, the patient complains only of the following symptoms, and all other reviewed systems  are negative.  ALLERGIES: No Known Allergies  HOME MEDICATIONS: Outpatient Medications Prior to Visit  Medication Sig Dispense Refill   albuterol (PROVENTIL HFA;VENTOLIN HFA) 108 (90 Base) MCG/ACT inhaler Inhale 1-2 puffs into the lungs every 6 (six) hours as needed for wheezing or shortness of breath. 18 g 3   Ascorbic Acid (VITAMIN C) 100 MG tablet Take 100 mg by mouth daily.     aspirin EC 81 MG tablet Take 81 mg by mouth daily.     atorvastatin (LIPITOR) 40 MG tablet Take 1 tablet by mouth once daily 90 tablet 1   Cetirizine HCl (ZYRTEC PO) Take 10 mg by mouth daily.     cholecalciferol (VITAMIN D) 400 units TABS tablet Take 400 Units by mouth daily.     citalopram (CELEXA) 10 MG tablet Take 1 tablet by mouth once daily 90 tablet 1   clobetasol cream (TEMOVATE) [id number] % Apply 1 application topically 2 (two) times daily as needed (rash). 60 g 3   diclofenac sodium (VOLTAREN) 1 % GEL Apply 2 g topically 4 (four) times daily as needed (pain).      donepezil (ARICEPT) 10 MG tablet Take 1 tablet (10 mg total) by mouth at bedtime. 90 tablet 3   EPINEPHrine (EPIPEN IJ) Inject as directed as directed.     ferrous sulfate 325 (65 FE) MG tablet Take 325 mg by mouth daily with breakfast.     folic acid (FOLVITE) 1 MG tablet Take 1 tablet (1 mg total) by mouth every other day. 45 tablet 1   lisinopril (ZESTRIL) 20 MG tablet Take 1 tablet by mouth once daily 90 tablet 0   memantine (NAMENDA) 5 MG tablet Take 1 tablet (5 mg total) by mouth 2 (two) times daily. 60 tablet 6   Misc Natural Products (TART CHERRY ADVANCED PO) Take by mouth.     sildenafil (VIAGRA) 100 MG tablet TAKE 1 TABLET BY MOUTH ONCE DAILY AS NEEDED FOR ERECTILE DYSFUNCTION 5 tablet 1   TURMERIC PO Take by mouth.     No facility-administered medications prior to visit.    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   Vitamin B12 deficiency anemia 12/04/2018   Word finding difficulty 10/21/2020    PAST SURGICAL HISTORY: Past Surgical History:  Procedure Laterality Date   CARDIAC CATHETERIZATION      FAMILY HISTORY: Family History  Problem Relation Age of Onset   Hypertension Mother    Heart disease Mother    Heart disease Father    Cancer Father    Alzheimer's disease Sister    Dementia Sister    Early death Brother 90       MVA   Stroke Brother    Early death Brother 48       Epilepsy    SOCIAL HISTORY: Social History   Socioeconomic History   Marital status: Married    Spouse name: Not on file   Number of children: Not on file   Years of education: 12   Highest education level: High school graduate  Occupational History   Occupation: Retired    Comment: Mechanic/maintenance  Tobacco Use   Smoking status:  Former   Smokeless tobacco: Never  Scientific laboratory technician Use: Never used  Substance and Sexual Activity   Alcohol use: Yes    Comment: a cocktail once in a while   Drug use: No   Sexual activity: Yes    Birth control/protection: None  Other Topics Concern   Not on file  Social History Narrative   Lives at home with wife    Right handed   Caffeine: 2 cups/day of coke    Social Determinants of Health   Financial Resource Strain: Low Risk    Difficulty of Paying Living Expenses: Not hard at all  Food Insecurity: No Food Insecurity   Worried About Charity fundraiser in the Last Year: Never true   Arboriculturist in the Last Year: Never true  Transportation Needs: No Transportation Needs   Lack of Transportation (Medical): No   Lack of Transportation (Non-Medical): No  Physical Activity: Sufficiently Active   Days of Exercise per Week: 5 days   Minutes of Exercise per Session: 30 min  Stress: No Stress Concern Present   Feeling of Stress : Not at all  Social Connections: Socially Integrated   Frequency of Communication with Friends and Family: More than three times a week   Frequency of Social Gatherings with Friends and Family: Three times a week   Attends Religious Services: More than 4 times per year   Active Member of Clubs or Organizations: No   Attends Music therapist: More than 4 times per year   Marital Status: Married  Human resources officer Violence: Not At Risk   Fear of Current or Ex-Partner: No   Emotionally Abused: No   Physically Abused: No   Sexually Abused: No      PHYSICAL EXAM  Vitals:   04/29/21 1454  BP: (!) 143/74  Pulse: (!) 55  Weight: 132 lb 9.6 oz (60.1 kg)  Height: 5\' 5"  (1.651 m)   Body mass index is 22.07 kg/m.  MMSE - Mini Mental State Exam 04/29/2021 12/03/2020 06/21/2018  Orientation to time 1 0 2  Orientation to Place 4 0 3  Registration 3 3 3   Attention/ Calculation 1 2 1   Recall 0 0 0  Language- name 2 objects 2 2 2  Language- repeat 1 1 1   Language- follow 3 step command 3 3 3   Language- read & follow direction 1 1 1   Write a sentence 1 1 1   Copy design 1 0 1  Total score 18 13 18      Generalized: Well developed, in no acute distress   Neurological examination  Mentation: Alert oriented to time, place, history taking. Follows all commands speech and language fluent Cranial nerve II-XII: Pupils were equal round reactive to light. Extraocular movements were full, visual field were full on confrontational test. Facial sensation and strength were normal.  Head turning and shoulder shrug  were normal and symmetric. Motor: The motor testing reveals 5 over 5 strength of all 4 extremities. Good symmetric motor tone is noted throughout.  Sensory: Sensory testing is intact to soft touch on all 4 extremities. No evidence of extinction is noted.  Coordination: Cerebellar testing reveals good finger-nose-finger and heel-to-shin bilaterally.  Gait and station: Gait is normal.    DIAGNOSTIC DATA (LABS, IMAGING, TESTING) - I reviewed patient records, labs, notes, testing and imaging myself where available.  Lab Results  Component Value Date   WBC 13.9 (H) 12/04/2020   HGB 14.5 12/04/2020   HCT 43.7 12/04/2020   MCV 96.9 12/04/2020   PLT 154 12/04/2020      Component Value Date/Time   NA 136 01/20/2021 0829   K 4.8 01/20/2021 0829   CL 102 01/20/2021 0829   CO2 21 01/20/2021 0829   GLUCOSE 101 (H) 01/20/2021 0829   GLUCOSE 140 (H) 12/04/2020 1926   BUN 23 01/20/2021 0829   CREATININE 1.59 (H) 01/20/2021 0829   CREATININE 1.41 (H) 11/12/2019 0932   CALCIUM 9.4 01/20/2021 0829   PROT 6.5 01/20/2021 0829   ALBUMIN 4.5 01/20/2021 0829   AST 21 01/20/2021 0829   ALT 15 01/20/2021 0829   ALKPHOS 104 01/20/2021 0829   BILITOT 0.4 01/20/2021 0829   GFRNONAA 51 (L) 12/04/2020 1926   GFRNONAA 48 (L) 11/12/2019 0932   GFRAA 56 (L) 11/12/2019 0932   Lab Results  Component Value Date   CHOL 107  01/20/2021   HDL 45 01/20/2021   LDLCALC 46 01/20/2021   LDLDIRECT 77.0 10/04/2017   TRIG 82 01/20/2021   CHOLHDL 2.4 01/20/2021   Lab Results  Component Value Date   HGBA1C 5.4 11/12/2019   Lab Results  Component Value Date   VITAMINB12 530 10/20/2020   Lab Results  Component Value Date   TSH 3.57 11/12/2019      ASSESSMENT AND PLAN 76 y.o. year old male  has a past medical history of Benign prostatic hyperplasia without lower urinary tract symptoms (05/25/2016), Bradycardia, severe sinus (09/12/2017), Chronic ischemic heart disease, Chronic ITP (idiopathic thrombocytopenia) (05/16/2019), Coronary artery disease involving native coronary artery of native heart without angina pectoris (05/25/2016), Dementia due to Alzheimer's disease, possible (12/15/2020), Dietary folate deficiency anemia (05/16/2019), Eczema (05/25/2016), Erectile dysfunction due to arterial insufficiency (99991111), Erosive oral lichen planus (0000000), Essential hypertension (05/25/2016), Hyperlipidemia (05/25/2016), IFG (impaired fasting glucose), Iron deficiency anemia due to chronic blood loss (05/25/2016), Major depressive disorder (08/01/2014), Mild intermittent asthma with acute exacerbation (09/12/2017), Osteoarthritis, Prepatellar bursitis, right knee (02/24/2020), Primary osteoarthritis of both shoulders (05/25/2016), S/P CABG x 1 (05/14/2013), Stage 3b chronic kidney disease (05/16/2019), Type 2 diabetes mellitus with diabetic nephropathy (09/09/2014), Vitamin B12 deficiency anemia (12/04/2018), and Word finding difficulty (10/21/2020). here with:  1.  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  Advised wife she can call and we will send in 10 mg tablet Discussed driving-recommended that driving be  restricted.  Patient and wife verbalized understanding. Follow-up in 6 months or sooner if needed     Ward Givens, MSN, NP-C 04/29/2021, 3:07 PM [facility] [address] [place], [place] [address] [phone]

## 2021-05-11 ENCOUNTER — Encounter: Payer: Self-pay | Admitting: Adult Health

## 2021-05-11 ENCOUNTER — Other Ambulatory Visit: Payer: Self-pay | Admitting: *Deleted

## 2021-05-11 MED ORDER — MEMANTINE HCL 10 MG PO TABS: 10.0000 mg | ORAL_TABLET | Freq: Two times a day (BID) | ORAL | 3 refills | Status: DC

## 2021-05-12 ENCOUNTER — Encounter: Payer: Self-pay | Admitting: Internal Medicine

## 2021-05-12 ENCOUNTER — Other Ambulatory Visit: Payer: Self-pay | Admitting: Internal Medicine

## 2021-05-12 DIAGNOSIS — N5201 Erectile dysfunction due to arterial insufficiency: Secondary | ICD-10-CM

## 2021-05-12 MED ORDER — SILDENAFIL CITRATE 100 MG PO TABS: ORAL_TABLET | ORAL | 1 refills | Status: DC

## 2021-05-24 ENCOUNTER — Encounter: Payer: Self-pay | Admitting: Internal Medicine

## 2021-05-24 DIAGNOSIS — F418 Other specified anxiety disorders: Secondary | ICD-10-CM

## 2021-05-24 MED ORDER — CITALOPRAM HYDROBROMIDE 10 MG PO TABS: 10.0000 mg | ORAL_TABLET | Freq: Every day | ORAL | 0 refills | Status: DC

## 2021-05-31 ENCOUNTER — Encounter: Payer: Self-pay | Admitting: Internal Medicine

## 2021-05-31 ENCOUNTER — Ambulatory Visit (INDEPENDENT_AMBULATORY_CARE_PROVIDER_SITE_OTHER): Payer: Medicare Other | Admitting: Internal Medicine

## 2021-05-31 ENCOUNTER — Telehealth: Payer: Self-pay | Admitting: Internal Medicine

## 2021-05-31 ENCOUNTER — Other Ambulatory Visit: Payer: Self-pay

## 2021-05-31 VITALS — BP 122/68 | HR 60 | Temp 97.9°F | Ht 65.0 in | Wt 127.4 lb

## 2021-05-31 DIAGNOSIS — D72829 Elevated white blood cell count, unspecified: Secondary | ICD-10-CM | POA: Diagnosis not present

## 2021-05-31 DIAGNOSIS — H1132 Conjunctival hemorrhage, left eye: Secondary | ICD-10-CM | POA: Diagnosis not present

## 2021-05-31 LAB — CBC WITH DIFFERENTIAL/PLATELET
Basophils Absolute: 0 10*3/uL (ref 0.0–0.1)
Basophils Relative: 0.8 % (ref 0.0–3.0)
Eosinophils Absolute: 0.2 10*3/uL (ref 0.0–0.7)
Eosinophils Relative: 3.2 % (ref 0.0–5.0)
HCT: 40.2 % (ref 39.0–52.0)
Hemoglobin: 13.6 g/dL (ref 13.0–17.0)
Lymphocytes Relative: 6.8 % — ABNORMAL LOW (ref 12.0–46.0)
Lymphs Abs: 0.4 10*3/uL — ABNORMAL LOW (ref 0.7–4.0)
MCHC: 33.8 g/dL (ref 30.0–36.0)
MCV: 93.6 fl (ref 78.0–100.0)
Monocytes Absolute: 0.5 10*3/uL (ref 0.1–1.0)
Monocytes Relative: 9.1 % (ref 3.0–12.0)
Neutro Abs: 4.6 10*3/uL (ref 1.4–7.7)
Neutrophils Relative %: 80.1 % — ABNORMAL HIGH (ref 43.0–77.0)
Platelets: 157 10*3/uL (ref 150.0–400.0)
RBC: 4.29 Mil/uL (ref 4.22–5.81)
RDW: 13.2 % (ref 11.5–15.5)
WBC: 5.7 10*3/uL (ref 4.0–10.5)

## 2021-05-31 LAB — APTT: aPTT: 27.6 s (ref 23.4–32.7)

## 2021-05-31 LAB — PROTIME-INR
INR: 1 ratio (ref 0.8–1.0)
Prothrombin Time: 11.2 s (ref 9.6–13.1)

## 2021-05-31 MED ORDER — TOBRADEX ST 0.3-0.05 % OP SUSP: 1.0000 [drp] | Freq: Four times a day (QID) | OPHTHALMIC | 0 refills | Status: DC

## 2021-05-31 NOTE — Telephone Encounter (Signed)
Connected to [employer] 2.19.2023.  Caller states that her husband woke up yesterday with blood covering the entire white of his eye. Tearing yesterday but no today. No pain.   Advised to see PCP within 3 days

## 2021-05-31 NOTE — Telephone Encounter (Signed)
Pt scheduled for today 2/10 @ 11.40am.

## 2021-05-31 NOTE — Patient Instructions (Signed)
Subconjunctival Hemorrhage ?Subconjunctival hemorrhage is bleeding that happens between the white part of your eye (sclera) and the clear membrane that covers the outside of your eye (conjunctiva). There are many tiny blood vessels near the surface of your eye. A subconjunctival hemorrhage happens when one or more of these vessels breaks and bleeds, causing a red patch to appear on your eye. This is similar to a bruise. ?Depending on the amount of bleeding, the red patch may only cover a small area of your eye or it may cover the entire visible part of the sclera. If a lot of blood collects under the conjunctiva, there may also be swelling. Subconjunctival hemorrhages do not affect your vision or cause pain, but your eye may feel irritated if there is swelling. Subconjunctival hemorrhages usually do not require treatment, and they usually disappear on their own within two to four weeks. ?What are the causes? ?This condition may be caused by: ?Mild trauma, such as rubbing your eye too hard. ?Blunt injuries, such as from playing sports or coming into contact with a deployed airbag. ?Coughing, sneezing, or vomiting. ?Straining, such as when lifting a heavy object. ?Medical conditions, such as: ?High blood pressure. ?Diabetes. ?Recent eye surgery. ?Certain medicines, especially blood thinners (anticoagulants), including aspirin. ?Other conditions, such as eye tumors, bleeding disorders, or blood vessel abnormalities. ?Subconjunctival hemorrhages can also happen without an obvious cause. ?What are the signs or symptoms? ?Symptoms of this condition include: ?A bright red or dark red patch on the white part of the eye. The red area may: ?Spread out to cover a larger area of the eye before it goes away. ?Turn colors such as pink or brownish-yellow before it goes away. ?Swelling around the eye. ?Mild eye irritation. ?How is this diagnosed? ?This condition is diagnosed with a physical exam. If your subconjunctival hemorrhage  was caused by trauma, your health care provider may refer you to an eye specialist (ophthalmologist) or another specialist to check for other injuries. You may have other tests, including: ?An eye exam including a vision test, checking your eye with a type of microscope (slit lamp) and measuring the pressure in your eye. Your eye may be dilated, especially if your subconjunctival hemorrhage was caused by trauma. ?A blood pressure check. ?Blood tests to check for bleeding disorders. ?If your subconjunctival hemorrhage was caused by trauma, X-rays or a CT scan may be done to check for other injuries. ?How is this treated? ?Usually, treatment is not needed for this condition. If you have discomfort, your health care provider may recommend eye drops or cold compresses. ?Follow these instructions at home: ?Take over-the-counter and prescription medicines only as directed by your health care provider. ?Use eye drops or cold compresses to help with discomfort as directed by your health care provider. ?Avoid activities, things, and environments that may irritate or injure your eye. ?Keep all follow-up visits. This is important. ?Contact a health care provider if: ?You have pain in your eye. ?The bleeding does not go away within 4 weeks. ?You keep getting new subconjunctival hemorrhages. ?Get help right away if: ?Your vision changes, you have difficulty seeing, or you develop double vision. ?You suddenly develop severe sensitivity to light. ?You develop a severe headache, persistent vomiting, confusion, or abnormal tiredness (lethargy). ?Your eye seems to bulge or protrude from your eye socket. ?You develop unexplained bruises on your body. ?You have unexplained bleeding in another area of your body. ?These symptoms may represent a serious problem that is an emergency. Do not   wait to see if the symptoms will go away. Get medical help right away. Call your local emergency services (911 in the [place] Do not drive yourself to  the hospital. ?Summary ?Subconjunctival hemorrhage is bleeding that happens between the white part of your eye and the clear membrane that covers the outside of your eye. ?This condition is similar to a bruise. ?Subconjunctival hemorrhages usually do not require treatment, and they usually disappear on their own within two to four weeks. ?Use eye drops or cold compresses to help with discomfort as directed by your health care provider. ?This information is not intended to replace advice given to you by your health care provider. Make sure you discuss any questions you have with your health care provider. ?Document Revised: 06/03/2020 Document Reviewed: 06/03/2020 ?Elsevier Patient Education ? 2022 Elsevier Inc. ? ?

## 2021-05-31 NOTE — Progress Notes (Signed)
Subjective:  Patient ID: Cordarious Wigen., male    DOB: [birth year]  Age: 76 y.o. MRN: [id number]  CC: Eye Problem  This visit occurred during the SARS-CoV-2 public health emergency.  Safety protocols were in place, including screening questions prior to the visit, additional usage of staff PPE, and extensive cleaning of exam room while observing appropriate contact time as indicated for disinfecting solutions.    HPI Yoshiyuki Lomanto. presents for f/up -  He complains of a 3 day hx of blood collecting in the left eye. The eye itches but there is no pain, photophobia, visual disturbance, or discharge. He is not aware of any trauma.  Outpatient Medications Prior to Visit  Medication Sig Dispense Refill   albuterol (PROVENTIL HFA;VENTOLIN HFA) 108 (90 Base) MCG/ACT inhaler Inhale 1-2 puffs into the lungs every 6 (six) hours as needed for wheezing or shortness of breath. 18 g 3   Ascorbic Acid (VITAMIN C) 100 MG tablet Take 100 mg by mouth daily.     aspirin EC 81 MG tablet Take 81 mg by mouth daily.     atorvastatin (LIPITOR) 40 MG tablet Take 1 tablet by mouth once daily 90 tablet 1   Cetirizine HCl (ZYRTEC PO) Take 10 mg by mouth daily.     cholecalciferol (VITAMIN D) 400 units TABS tablet Take 400 Units by mouth daily.     citalopram (CELEXA) 10 MG tablet Take 1 tablet (10 mg total) by mouth daily. 90 tablet 0   clobetasol cream (TEMOVATE) [id number] % Apply 1 application topically 2 (two) times daily as needed (rash). 60 g 3   diclofenac sodium (VOLTAREN) 1 % GEL Apply 2 g topically 4 (four) times daily as needed (pain).      donepezil (ARICEPT) 10 MG tablet Take 1 tablet (10 mg total) by mouth at bedtime. 90 tablet 3   EPINEPHrine (EPIPEN IJ) Inject as directed as directed.     ferrous sulfate 325 (65 FE) MG tablet Take 325 mg by mouth daily with breakfast.     folic acid (FOLVITE) 1 MG tablet Take 1 tablet (1 mg total) by mouth every other day. 45 tablet 1   lisinopril (ZESTRIL) 20  MG tablet Take 1 tablet by mouth once daily 90 tablet 0   memantine (NAMENDA) 10 MG tablet Take 1 tablet (10 mg total) by mouth 2 (two) times daily. 180 tablet 3   Misc Natural Products (TART CHERRY ADVANCED PO) Take by mouth.     sildenafil (VIAGRA) 100 MG tablet TAKE 1 TABLET BY MOUTH ONCE DAILY AS NEEDED FOR ERECTILE DYSFUNCTION 5 tablet 1   TURMERIC PO Take by mouth.     No facility-administered medications prior to visit.    ROS Review of Systems  Constitutional:  Negative for diaphoresis and fatigue.  HENT: Negative.  Negative for nosebleeds, sinus pressure and sore throat.   Eyes:  Positive for redness and itching. Negative for photophobia, pain, discharge and visual disturbance.  Respiratory:  Negative for cough.   Cardiovascular:  Negative for chest pain, palpitations and leg swelling.  Gastrointestinal:  Negative for abdominal pain and blood in stool.  Genitourinary: Negative.  Negative for difficulty urinating and hematuria.  Musculoskeletal: Negative.   Skin: Negative.   Neurological: Negative.   Hematological:  Negative for adenopathy. Does not bruise/bleed easily.  Psychiatric/Behavioral: Negative.     Objective:  BP 122/68 (BP Location: Left Arm, Patient Position: Sitting, Cuff Size: Large)    Pulse 60  Temp 97.9 F (36.6 C) (Oral)    Ht 5\' 5"  (1.651 m)    Wt 127 lb 6.4 oz (57.8 kg)    SpO2 98%    BMI 21.20 kg/m   BP Readings from Last 3 Encounters:  05/31/21 122/68  04/29/21 (!) 143/74  01/20/21 120/62    Wt Readings from Last 3 Encounters:  05/31/21 127 lb 6.4 oz (57.8 kg)  04/29/21 132 lb 9.6 oz (60.1 kg)  01/20/21 133 lb 3.2 oz (60.4 kg)    Physical Exam Vitals reviewed.  Constitutional:      Appearance: He is not ill-appearing.  HENT:     Head: Contusion present. No raccoon eyes, Battle's sign or abrasion.      Mouth/Throat:     Mouth: Mucous membranes are moist.  Eyes:     General: Lids are normal. Vision grossly intact.        Right eye: No  foreign body, discharge or hordeolum.        Left eye: No foreign body, discharge or hordeolum.     Extraocular Movements:     Right eye: Normal extraocular motion and no nystagmus.     Left eye: Normal extraocular motion and no nystagmus.     Conjunctiva/sclera:     Right eye: Right conjunctiva is not injected. No chemosis, exudate or hemorrhage.    Left eye: Left conjunctiva is not injected. Hemorrhage present. No chemosis or exudate.    Pupils: Pupils are equal.     Right eye: Pupil is not reactive. Pupil is round. No corneal abrasion.     Left eye: Pupil is not reactive. Pupil is round. No corneal abrasion.     Funduscopic exam:    Right eye: No hemorrhage or exudate. Red reflex absent.        Left eye: No hemorrhage or exudate. Red reflex absent.     Comments: No hyphema The anterior chamber is quiet  Cardiovascular:     Rate and Rhythm: Normal rate and regular rhythm.     Heart sounds: No murmur heard. Pulmonary:     Effort: Pulmonary effort is normal.     Breath sounds: No stridor. No wheezing, rhonchi or rales.  Abdominal:     General: Abdomen is flat.     Palpations: There is no mass.     Tenderness: There is no abdominal tenderness. There is no guarding.     Hernia: No hernia is present.  Musculoskeletal:     Cervical back: Neck supple.  Skin:    General: Skin is warm and dry.  Neurological:     Mental Status: He is alert.  Psychiatric:        Thought Content: Thought content normal.    Lab Results  Component Value Date   WBC 5.7 05/31/2021   HGB 13.6 05/31/2021   HCT 40.2 05/31/2021   PLT 157.0 05/31/2021   GLUCOSE 101 (H) 01/20/2021   CHOL 107 01/20/2021   TRIG 82 01/20/2021   HDL 45 01/20/2021   LDLDIRECT 77.0 10/04/2017   LDLCALC 46 01/20/2021   ALT 15 01/20/2021   AST 21 01/20/2021   NA 136 01/20/2021   K 4.8 01/20/2021   CL 102 01/20/2021   CREATININE 1.59 (H) 01/20/2021   BUN 23 01/20/2021   CO2 21 01/20/2021   TSH 3.57 11/12/2019   PSA 3.49  12/04/2018   INR 1.0 05/31/2021   HGBA1C 5.4 11/12/2019    DG Eye Foreign Body  Result Date: 12/16/2020 CLINICAL  DATA:  Metal working/exposure; clearance prior to MRI EXAM: ORBITS FOR FOREIGN BODY - 2 VIEW COMPARISON:  None. FINDINGS: There is no evidence of metallic foreign body within the orbits. No significant bone abnormality identified. IMPRESSION: No evidence of metallic foreign body within the orbits. Electronically Signed   By: Davina Poke D.O.   On: 12/16/2020 11:45   MR BRAIN W WO CONTRAST  Result Date: 12/18/2020 [facility] NEUROIMAG[facility] REPORT STUDY DATE: 12/16/20 PATIENT NAME: Stephaun Borders. DOB: [birth year] MRN: [id number] ORDERING CLINICIAN: Melvenia Beam, MD CLINICAL HISTORY: 76 year old male with memory loss. EXAM: MR BRAIN W WO CONTRAST TECHNIQUE: MRI of the brain with and without contrast was obtained utilizing 5 mm axial slices with T1, T2, T2 flair, SWI and diffusion weighted views.  T1 sagittal, T2 coronal and postcontrast views in the axial and coronal plane were obtained. CONTRAST: 11ml multihance COMPARISON: none IMAGING SITE: [facility] [address] (1.5 Tesla MRI)  FINDINGS: No abnormal lesions are seen on diffusion-weighted views to suggest acute ischemia. The cortical sulci, fissures and cisterns are notable for moderate perisylvian and mild frontal atrophy. Lateral, third and fourth ventricle are normal in size and appearance. No extra-axial fluid collections are seen. No evidence of mass effect or midline shift.  Few periventricular and subcortical foci of non-specific T2 hyperintensities. No abnormal lesions are seen on post contrast views.  On sagittal views the posterior fossa, pituitary gland and corpus callosum are unremarkable. No evidence of intracranial hemorrhage on SWI views. The orbits and their contents, paranasal sinuses and calvarium are unremarkable.  Intracranial flow voids are present.   MRI brain (with and  without) demonstrating; - Moderate perisylvian and mild frontal atrophy. - No acute findings. INTERPRETING PHYSICIAN: Penni Bombard, MD Certified in Neurology, Neurophysiology and Neuroimaging [facility] [address], [place], [place] [address] [phone]    Assessment & Plan:   Rudi was seen today for eye problem.  Diagnoses and all orders for this visit:  Subconjunctival hemorrhage, traumatic, left- Labs are negative for coagulopathy. This is likely caused by subtle trauma/ASA. I offered reassuance and pt ed. Will RX for the itching. -     CBC with Differential/Platelet; Future -     Protime-INR; Future -     APTT; Future -     Tobramycin-Dexamethasone (TOBRADEX ST) 0.3-0.05 % SUSP; Apply 1 drop to eye 4 (four) times daily. -     APTT -     Protime-INR -     CBC with Differential/Platelet  Leukocytosis, unspecified type- CBC is normal now. -     CBC with Differential/Platelet; Future -     Protime-INR; Future -     APTT; Future -     APTT -     Protime-INR -     CBC with Differential/Platelet   I am having [name] start on TobraDex ST. I am also having him maintain his aspirin EC, cholecalciferol, diclofenac sodium, ferrous sulfate, vitamin C, albuterol, EPINEPHrine (EPIPEN IJ), Cetirizine HCl (ZYRTEC PO), Misc Natural Products (TART CHERRY ADVANCED PO), TURMERIC PO, clobetasol cream, atorvastatin, donepezil, lisinopril, folic acid, memantine, sildenafil, and citalopram.  Meds ordered this encounter  Medications   Tobramycin-Dexamethasone (TOBRADEX ST) 0.3-0.05 % SUSP    Sig: Apply 1 drop to eye 4 (four) times daily.    Dispense:  5 mL    Refill:  0     Follow-up: Return if symptoms worsen or fail  to improve.  Scarlette Calico, MD

## 2021-06-01 ENCOUNTER — Telehealth: Payer: Self-pay | Admitting: *Deleted

## 2021-06-01 NOTE — Telephone Encounter (Signed)
[facility] called and states the prescription Tobradex ST they don't have it in stock and would like to know if it can be changed to the regular Tobradex instead of ST.  Thank you

## 2021-06-01 NOTE — Telephone Encounter (Signed)
Pharmacy called and notified ok for regular Tobradex.

## 2021-06-08 ENCOUNTER — Encounter: Payer: Self-pay | Admitting: Internal Medicine

## 2021-06-08 DIAGNOSIS — E785 Hyperlipidemia, unspecified: Secondary | ICD-10-CM

## 2021-06-08 MED ORDER — ATORVASTATIN CALCIUM 40 MG PO TABS: 40.0000 mg | ORAL_TABLET | Freq: Every day | ORAL | 1 refills | Status: DC

## 2021-07-09 ENCOUNTER — Other Ambulatory Visit: Payer: Self-pay | Admitting: Internal Medicine

## 2021-07-09 ENCOUNTER — Encounter: Payer: Self-pay | Admitting: Internal Medicine

## 2021-07-09 DIAGNOSIS — I10 Essential (primary) hypertension: Secondary | ICD-10-CM

## 2021-07-09 DIAGNOSIS — I2510 Atherosclerotic heart disease of native coronary artery without angina pectoris: Secondary | ICD-10-CM

## 2021-07-09 MED ORDER — LISINOPRIL 20 MG PO TABS: 20.0000 mg | ORAL_TABLET | Freq: Every day | ORAL | 0 refills | Status: DC

## 2021-07-19 ENCOUNTER — Encounter: Payer: Self-pay | Admitting: Internal Medicine

## 2021-07-19 ENCOUNTER — Other Ambulatory Visit: Payer: Self-pay | Admitting: Internal Medicine

## 2021-07-19 DIAGNOSIS — H1013 Acute atopic conjunctivitis, bilateral: Secondary | ICD-10-CM | POA: Insufficient documentation

## 2021-07-19 DIAGNOSIS — J301 Allergic rhinitis due to pollen: Secondary | ICD-10-CM | POA: Insufficient documentation

## 2021-07-19 MED ORDER — MONTELUKAST SODIUM 10 MG PO TABS: 10.0000 mg | ORAL_TABLET | Freq: Every day | ORAL | 1 refills | Status: DC

## 2021-07-19 MED ORDER — PREDNISOLONE ACETATE 0.12 % OP SUSP: 1.0000 [drp] | Freq: Four times a day (QID) | OPHTHALMIC | 0 refills | Status: DC

## 2021-08-25 ENCOUNTER — Other Ambulatory Visit: Payer: Self-pay | Admitting: Internal Medicine

## 2021-08-25 DIAGNOSIS — F418 Other specified anxiety disorders: Secondary | ICD-10-CM

## 2021-08-25 MED ORDER — CITALOPRAM HYDROBROMIDE 10 MG PO TABS: 10.0000 mg | ORAL_TABLET | Freq: Every day | ORAL | 1 refills | Status: DC

## 2021-09-15 ENCOUNTER — Telehealth: Payer: Self-pay | Admitting: Adult Health

## 2021-09-15 NOTE — Telephone Encounter (Signed)
LVM and sent mychart msg informing pt of r/s needed for 07/26- Megan out.

## 2021-09-20 NOTE — Telephone Encounter (Signed)
Patient's wife left a voicemail with our office this morning asking for a call back to reschedule patient's appointment.

## 2021-09-20 NOTE — Telephone Encounter (Signed)
R/s appointment with pt's wife over the phone

## 2021-10-03 ENCOUNTER — Other Ambulatory Visit: Payer: Self-pay | Admitting: Internal Medicine

## 2021-10-03 DIAGNOSIS — I10 Essential (primary) hypertension: Secondary | ICD-10-CM

## 2021-10-03 DIAGNOSIS — I2510 Atherosclerotic heart disease of native coronary artery without angina pectoris: Secondary | ICD-10-CM

## 2021-10-18 ENCOUNTER — Telehealth: Payer: Self-pay | Admitting: Internal Medicine

## 2021-10-18 ENCOUNTER — Other Ambulatory Visit: Payer: Self-pay | Admitting: Internal Medicine

## 2021-10-18 DIAGNOSIS — N5201 Erectile dysfunction due to arterial insufficiency: Secondary | ICD-10-CM

## 2021-10-18 MED ORDER — SILDENAFIL CITRATE 100 MG PO TABS: ORAL_TABLET | ORAL | 3 refills | Status: AC

## 2021-10-18 NOTE — Telephone Encounter (Signed)
PT visits today in need of a refill on their sildenafil (VIAGRA) 100 mg. PT would like this refill sent to the [facility] [address].  CB if needed: [phone]

## 2021-10-21 ENCOUNTER — Ambulatory Visit: Payer: Medicare Other

## 2021-10-22 ENCOUNTER — Ambulatory Visit (INDEPENDENT_AMBULATORY_CARE_PROVIDER_SITE_OTHER): Payer: Medicare Other

## 2021-10-22 DIAGNOSIS — Z0000 Encounter for general adult medical examination without abnormal findings: Secondary | ICD-10-CM | POA: Diagnosis not present

## 2021-10-22 NOTE — Patient Instructions (Signed)
Bruce Jordan , Thank you for taking time to come for your Medicare Wellness Visit. I appreciate your ongoing commitment to your health goals. Please review the following plan we discussed and let me know if I can assist you in the future.   Screening recommendations/referrals: Colonoscopy: Discontinued due to age Recommended yearly ophthalmology/optometry visit for glaucoma screening and checkup Recommended yearly dental visit for hygiene and checkup  Vaccinations: Influenza vaccine: 01/31/2021 Pneumococcal vaccine: 12/02/2012, 05/25/2016 Tdap vaccine: 12/26/2016; due every 10 years Shingles vaccine: never done   Covid-19: 1/18/221, 05/13/2019, 02/09/2020, 02/10/2020, 01/31/2021  Advanced directives: Yes; Please bring a copy of your health care power of attorney and living will to the office at your convenience.  Conditions/risks identified: Yes  Next appointment: Please schedule your next Medicare Wellness Visit with your Nurse Health Advisor in 1 year by calling [phone].  Preventive Care 26 Years and Older, Male Preventive care refers to lifestyle choices and visits with your health care provider that can promote health and wellness. What does preventive care include? A yearly physical exam. This is also called an annual well check. Dental exams once or twice a year. Routine eye exams. Ask your health care provider how often you should have your eyes checked. Personal lifestyle choices, including: Daily care of your teeth and gums. Regular physical activity. Eating a healthy diet. Avoiding tobacco and drug use. Limiting alcohol use. Practicing safe sex. Taking low doses of aspirin every day. Taking vitamin and mineral supplements as recommended by your health care provider. What happens during an annual well check? The services and screenings done by your health care provider during your annual well check will depend on your age, overall health, lifestyle risk factors, and family  history of disease. Counseling  Your health care provider may ask you questions about your: Alcohol use. Tobacco use. Drug use. Emotional well-being. Home and relationship well-being. Sexual activity. Eating habits. History of falls. Memory and ability to understand (cognition). Work and work Astronomer. Screening  You may have the following tests or measurements: Height, weight, and BMI. Blood pressure. Lipid and cholesterol levels. These may be checked every 5 years, or more frequently if you are over 56 years old. Skin check. Lung cancer screening. You may have this screening every year starting at age 51 if you have a 30-pack-year history of smoking and currently smoke or have quit within the past 15 years. Fecal occult blood test (FOBT) of the stool. You may have this test every year starting at age 64. Flexible sigmoidoscopy or colonoscopy. You may have a sigmoidoscopy every 5 years or a colonoscopy every 10 years starting at age 64. Prostate cancer screening. Recommendations will vary depending on your family history and other risks. Hepatitis C blood test. Hepatitis B blood test. Sexually transmitted disease (STD) testing. Diabetes screening. This is done by checking your blood sugar (glucose) after you have not eaten for a while (fasting). You may have this done every 1-3 years. Abdominal aortic aneurysm (AAA) screening. You may need this if you are a current or former smoker. Osteoporosis. You may be screened starting at age 93 if you are at high risk. Talk with your health care provider about your test results, treatment options, and if necessary, the need for more tests. Vaccines  Your health care provider may recommend certain vaccines, such as: Influenza vaccine. This is recommended every year. Tetanus, diphtheria, and acellular pertussis (Tdap, Td) vaccine. You may need a Td booster every 10 years. Zoster vaccine. You may  need this after age 22. Pneumococcal  13-valent conjugate (PCV13) vaccine. One dose is recommended after age 21. Pneumococcal polysaccharide (PPSV23) vaccine. One dose is recommended after age 31. Talk to your health care provider about which screenings and vaccines you need and how often you need them. This information is not intended to replace advice given to you by your health care provider. Make sure you discuss any questions you have with your health care provider. Document Released: 04/24/2015 Document Revised: 12/16/2015 Document Reviewed: 01/27/2015 Elsevier Interactive Patient Education  2017 [place] Prevention in the Home Falls can cause injuries. They can happen to people of all ages. There are many things you can do to make your home safe and to help prevent falls. What can I do on the outside of my home? Regularly fix the edges of walkways and driveways and fix any cracks. Remove anything that might make you trip as you walk through a door, such as a raised step or threshold. Trim any bushes or trees on the path to your home. Use bright outdoor lighting. Clear any walking paths of anything that might make someone trip, such as rocks or tools. Regularly check to see if handrails are loose or broken. Make sure that both sides of any steps have handrails. Any raised decks and porches should have guardrails on the edges. Have any leaves, snow, or ice cleared regularly. Use sand or salt on walking paths during winter. Clean up any spills in your garage right away. This includes oil or grease spills. What can I do in the bathroom? Use night lights. Install grab bars by the toilet and in the tub and shower. Do not use towel bars as grab bars. Use non-skid mats or decals in the tub or shower. If you need to sit down in the shower, use a plastic, non-slip stool. Keep the floor dry. Clean up any water that spills on the floor as soon as it happens. Remove soap buildup in the tub or shower regularly. Attach  bath mats securely with double-sided non-slip rug tape. Do not have throw rugs and other things on the floor that can make you trip. What can I do in the bedroom? Use night lights. Make sure that you have a light by your bed that is easy to reach. Do not use any sheets or blankets that are too big for your bed. They should not hang down onto the floor. Have a firm chair that has side arms. You can use this for support while you get dressed. Do not have throw rugs and other things on the floor that can make you trip. What can I do in the kitchen? Clean up any spills right away. Avoid walking on wet floors. Keep items that you use a lot in easy-to-reach places. If you need to reach something above you, use a strong step stool that has a grab bar. Keep electrical cords out of the way. Do not use floor polish or wax that makes floors slippery. If you must use wax, use non-skid floor wax. Do not have throw rugs and other things on the floor that can make you trip. What can I do with my stairs? Do not leave any items on the stairs. Make sure that there are handrails on both sides of the stairs and use them. Fix handrails that are broken or loose. Make sure that handrails are as long as the stairways. Check any carpeting to make sure that it is firmly attached  to the stairs. Fix any carpet that is loose or worn. Avoid having throw rugs at the top or bottom of the stairs. If you do have throw rugs, attach them to the floor with carpet tape. Make sure that you have a light switch at the top of the stairs and the bottom of the stairs. If you do not have them, ask someone to add them for you. What else can I do to help prevent falls? Wear shoes that: Do not have high heels. Have rubber bottoms. Are comfortable and fit you well. Are closed at the toe. Do not wear sandals. If you use a stepladder: Make sure that it is fully opened. Do not climb a closed stepladder. Make sure that both sides of the  stepladder are locked into place. Ask someone to hold it for you, if possible. Clearly mark and make sure that you can see: Any grab bars or handrails. First and last steps. Where the edge of each step is. Use tools that help you move around (mobility aids) if they are needed. These include: Canes. Walkers. Scooters. Crutches. Turn on the lights when you go into a dark area. Replace any light bulbs as soon as they burn out. Set up your furniture so you have a clear path. Avoid moving your furniture around. If any of your floors are uneven, fix them. If there are any pets around you, be aware of where they are. Review your medicines with your doctor. Some medicines can make you feel dizzy. This can increase your chance of falling. Ask your doctor what other things that you can do to help prevent falls. This information is not intended to replace advice given to you by your health care provider. Make sure you discuss any questions you have with your health care provider. Document Released: 01/22/2009 Document Revised: 09/03/2015 Document Reviewed: 05/02/2014 Elsevier Interactive Patient Education  2017 [employer].

## 2021-10-22 NOTE — Progress Notes (Signed)
I connected with Bruce Jordan today by telephone and verified that I am speaking with the correct person using two identifiers. Location patient: home Location provider: work Persons participating in the virtual visit: patient, provider.   I discussed the limitations, risks, security and privacy concerns of performing an evaluation and management service by telephone and the availability of in person appointments. I also discussed with the patient that there may be a patient responsible charge related to this service. The patient expressed understanding and verbally consented to this telephonic visit.    Interactive audio and video telecommunications were attempted between this provider and patient, however failed, due to patient having technical difficulties OR patient did not have access to video capability.  We continued and completed visit with audio only.  Some vital signs may be absent or patient reported.   Time Spent with patient on telephone encounter: 30 minutes  Subjective:   Bruce Bibler. is a 76 y.o. male who presents for Medicare Annual/Subsequent preventive examination.  Review of Systems     Cardiac Risk Factors include: advanced age (>64men, >32 women);dyslipidemia;family history of premature cardiovascular disease;hypertension;male gender     Objective:    Today's Vitals   10/22/21 1516  PainSc: 5    There is no height or weight on file to calculate BMI.     10/22/2021    3:20 PM 10/20/2020    1:02 PM 05/13/2017    6:31 PM  Advanced Directives  Does Patient Have a Medical Advance Directive? Yes Yes No  Type of Paramedic of [place];Living will Living will;Healthcare Power of Attorney   Does patient want to make changes to medical advance directive? No - Patient declined No - Patient declined   Copy of [place] in Chart? No - copy requested No - copy requested     Current Medications (verified) Outpatient  Encounter Medications as of 10/22/2021  Medication Sig   albuterol (PROVENTIL HFA;VENTOLIN HFA) 108 (90 Base) MCG/ACT inhaler Inhale 1-2 puffs into the lungs every 6 (six) hours as needed for wheezing or shortness of breath.   Ascorbic Acid (VITAMIN C) 100 MG tablet Take 100 mg by mouth daily.   aspirin EC 81 MG tablet Take 81 mg by mouth daily.   atorvastatin (LIPITOR) 40 MG tablet Take 1 tablet (40 mg total) by mouth daily.   Cetirizine HCl (ZYRTEC PO) Take 10 mg by mouth daily.   cholecalciferol (VITAMIN D) 400 units TABS tablet Take 400 Units by mouth daily.   citalopram (CELEXA) 10 MG tablet Take 1 tablet (10 mg total) by mouth daily.   clobetasol cream (TEMOVATE) [id number] % Apply 1 application topically 2 (two) times daily as needed (rash).   diclofenac sodium (VOLTAREN) 1 % GEL Apply 2 g topically 4 (four) times daily as needed (pain).    donepezil (ARICEPT) 10 MG tablet Take 1 tablet (10 mg total) by mouth at bedtime.   EPINEPHrine (EPIPEN IJ) Inject as directed as directed.   ferrous sulfate 325 (65 FE) MG tablet Take 325 mg by mouth daily with breakfast.   folic acid (FOLVITE) 1 MG tablet Take 1 tablet (1 mg total) by mouth every other day.   lisinopril (ZESTRIL) 20 MG tablet Take 1 tablet by mouth once daily   memantine (NAMENDA) 10 MG tablet Take 1 tablet (10 mg total) by mouth 2 (two) times daily.   Misc Natural Products (TART CHERRY ADVANCED PO) Take by mouth.   montelukast (SINGULAIR)  10 MG tablet Take 1 tablet (10 mg total) by mouth at bedtime.   prednisoLONE acetate (PRED MILD) 0.12 % ophthalmic suspension Place 1 drop into both eyes 4 (four) times daily.   sildenafil (VIAGRA) 100 MG tablet TAKE 1 TABLET BY MOUTH ONCE DAILY AS NEEDED FOR ERECTILE DYSFUNCTION   TURMERIC PO Take by mouth.   No facility-administered encounter medications on file as of 10/22/2021.    Allergies (verified) Patient has no known allergies.   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   Vitamin B12 deficiency anemia 12/04/2018   Word finding difficulty 10/21/2020   Past Surgical History:  Procedure Laterality Date   CARDIAC CATHETERIZATION     Family History  Problem Relation Age of Onset   Hypertension Mother    Heart disease Mother    Heart disease Father    Cancer Father    Alzheimer's disease Sister    Dementia Sister    Early death Brother 42       MVA   Stroke Brother    Early death Brother 48       Epilepsy   Social History   Socioeconomic History   Marital status: Married    Spouse name: Not on file   Number of children: Not on file   Years of education: 12   Highest education level: High school graduate  Occupational History   Occupation: Retired    Comment: Mechanic/maintenance  Tobacco  Use   Smoking status: Former   Smokeless tobacco: Never  Building services engineer Use: Never used  Substance and Sexual Activity   Alcohol use: Yes    Comment: a cocktail once in a while   Drug use: No   Sexual activity: Yes    Birth control/protection: None  Other Topics Concern   Not on file  Social History Narrative   Lives at home with wife    Right handed   Caffeine: 2 cups/day of coke    Social Determinants of Health   Financial Resource Strain: Low Risk  (10/22/2021)   Overall Financial Resource Strain (CARDIA)    Difficulty of Paying Living Expenses: Not hard at all  Food Insecurity: No Food Insecurity (10/22/2021)   Hunger Vital Sign    Worried About Running Out of Food in the Last Year: Never true    Ran Out of Food in  the Last Year: Never true  Transportation Needs: No Transportation Needs (10/22/2021)   PRAPARE - Hydrologist (Medical): No    Lack of Transportation (Non-Medical): No  Physical Activity: Sufficiently Active (10/22/2021)   Exercise Vital Sign    Days of Exercise per Week: 5 days    Minutes of Exercise per Session: 30 min  Stress: No Stress Concern Present (10/22/2021)   [place]    Feeling of Stress : Not at all  Social Connections: [place] (10/22/2021)   Social Connection and Isolation Panel [NHANES]    Frequency of Communication with Friends and Family: More than three times a week    Frequency of Social Gatherings with Friends and Family: Three times a week    Attends Religious Services: More than 4 times per year    Active Member of Clubs or Organizations: No    Attends Music therapist: More than 4 times per year    Marital Status: Married    Tobacco Counseling Counseling given: Not Answered   Clinical Intake:  Pre-visit preparation completed: Yes  Pain : 0-10 Pain Score: 5  Pain Type: Chronic pain Pain Location:  Shoulder Pain Orientation: Right, Left Pain Descriptors / Indicators: Discomfort Pain Onset: More than a month ago Pain Frequency: Constant Pain Relieving Factors: none Effect of Pain on Daily Activities: Pain can diminish job performance, lower motivation to exercise, and prevent you from completing daily tasks. Pain produces disability and affects the quality of life.  Pain Relieving Factors: none  BMI - recorded: 21.2 Nutritional Status: BMI of 19-24  Normal Nutritional Risks: None Diabetes: Yes CBG done?: No Did pt. bring in CBG monitor from home?: No  How often do you need to have someone help you when you read instructions, pamphlets, or other written materials from your doctor or pharmacy?: 1 - Never What is the last grade level you completed in school?: HSG  Diabetic? NO  Interpreter Needed?: No  Information entered by :: Lisette Abu, LPN.   Activities of Daily Living    10/22/2021    3:39 PM  In your present state of health, do you have any difficulty performing the following activities:  Hearing? 1  Vision? 0  Difficulty concentrating or making decisions? 1  Walking or climbing stairs? 0  Dressing or bathing? 0  Doing errands, shopping? 0  Preparing Food and eating ? N  Using the Toilet? N  In the past six months, have you accidently leaked urine? Y  Do you have problems with loss of bowel control? N  Managing your Medications? N  Managing your Finances? N  Housekeeping or managing your Housekeeping? N    Patient Care Team: Janith Lima, MD as PCP - General (Internal Medicine) Jola Schmidt, MD as Consulting Physician (Ophthalmology) Ward Givens, NP as Registered Nurse (Neurology) Bjorn Loser, MD as Consulting Physician (Urology) [place], Peter M, MD as Consulting Physician (Cardiology)  Indicate any recent Medical Services you may have received from other than [facility] providers in the past year (date may be approximate).      Assessment:   This is a routine wellness examination for Bruce Jordan.  Hearing/Vision screen Hearing Screening - Comments:: Patient wears hearing aids. Vision Screening - Comments:: Patient does wear corrective lenses/contacts.  Eye exam done by: Jola Schmidt, MD.   Dietary issues and exercise activities discussed: Current Exercise Habits: Home exercise routine, Type of exercise: walking, Time (Minutes): 30,  Frequency (Times/Week): 5, Weekly Exercise (Minutes/Week): 150, Intensity: Mild, Exercise limited by: respiratory conditions(s)   Goals Addressed   None   Depression Screen    10/22/2021    3:23 PM 05/31/2021   11:51 AM 10/20/2020    1:01 PM 02/24/2020    4:53 PM 11/12/2019    8:59 AM 07/16/2019    9:25 AM 12/04/2018    2:58 PM  PHQ 2/9 Scores  PHQ - 2 Score 0 0 0 0 0 0 0  PHQ- 9 Score    0 0 0 1    Fall Risk    10/22/2021    3:21 PM 05/31/2021   11:51 AM 10/20/2020    1:04 PM 11/12/2019    8:59 AM 07/16/2019    9:27 AM  Fall Risk   Falls in the past year? 0 0 0 0 0  Number falls in past yr: 0 0 0 0 0  Injury with Fall? 0 0 0 0 0  Risk for fall due to : No Fall Risks  No Fall Risks No Fall Risks No Fall Risks  Follow up Falls evaluation completed  Falls evaluation completed Falls evaluation completed Falls evaluation completed    FALL RISK PREVENTION PERTAINING TO THE HOME:  Any stairs in or around the home? No  If so, are there any without handrails? No  Home free of loose throw rugs in walkways, pet beds, electrical cords, etc? Yes  Adequate lighting in your home to reduce risk of falls? Yes   ASSISTIVE DEVICES UTILIZED TO PREVENT FALLS:  Life alert? No  Use of a cane, walker or w/c? No  Grab bars in the bathroom? Yes  Shower chair or bench in shower? No  Elevated toilet seat or a handicapped toilet? No   TIMED UP AND GO:  Was the test performed? No .  Length of time to ambulate 10 feet: N/A sec.   Appearance of gait: Gait not evaluated during this  visit.  Cognitive Function:    10/22/2021    3:40 PM 04/29/2021    3:04 PM 12/03/2020    7:25 AM 06/21/2018    8:00 AM  MMSE - Mini Mental State Exam  Not completed: Unable to complete     Orientation to time  1 0 2  Orientation to Place  4 0 3  Registration  3 3 3   Attention/ Calculation  1 2 1   Recall  0 0 0  Language- name 2 objects  2 2 2   Language- repeat  1 1 1   Language- follow 3 step command  3 3 3   Language- read & follow direction  1 1 1   Write a sentence  1 1 1   Copy design  1 0 1  Total score  18 13 18         Immunizations Immunization History  Administered Date(s) Administered   Fluad Quad(high Dose 65+) 01/06/2019, 01/20/2020   Influenza Split 02/06/2014, 01/20/2020   Influenza, High Dose Seasonal PF 01/05/2015, 03/11/2016, 12/26/2016, 02/12/2018   Influenza-Unspecified 01/31/2021   Moderna Covid-19 Vaccine Bivalent Booster 12yrs & up 01/31/2021   Moderna Sars-Covid-2 Vaccination 02/09/2020, 02/10/2020   PFIZER(Purple Top)SARS-COV-2 Vaccination 04/29/2019, 05/13/2019   Pneumococcal Conjugate-13 12/02/2012   Pneumococcal Polysaccharide-23 05/25/2016   Tdap 12/26/2016    TDAP status: Up to date  Flu Vaccine status: Up to date  Pneumococcal vaccine status: Up to date  Covid-19 vaccine status: Completed vaccines  Qualifies for Shingles Vaccine? Yes   Zostavax completed No  Shingrix Completed?: No.    Education has been provided regarding the importance of this vaccine. Patient has been advised to call insurance company to determine out of pocket expense if they have not yet received this vaccine. Advised may also receive vaccine at local pharmacy or Health Dept. Verbalized acceptance and understanding.  Screening Tests Health Maintenance  Topic Date Due   FOOT EXAM  Never done   OPHTHALMOLOGY EXAM  Never done   Zoster Vaccines- Shingrix (1 of 2) Never done   Diabetic kidney evaluation - Urine ACR  09/09/2015   HEMOGLOBIN A1C  05/14/2020   COVID-19  Vaccine (6 - Pfizer series) 06/03/2021   INFLUENZA VACCINE  11/09/2021   Diabetic kidney evaluation - GFR measurement  01/20/2022   TETANUS/TDAP  12/27/2026   Pneumonia Vaccine 90+ Years old  Completed   Hepatitis C Screening  Completed   HPV VACCINES  Aged Out   COLONOSCOPY (Pts 45-69yrs Insurance coverage will need to be confirmed)  Discontinued    Health Maintenance  Health Maintenance Due  Topic Date Due   FOOT EXAM  Never done   OPHTHALMOLOGY EXAM  Never done   Zoster Vaccines- Shingrix (1 of 2) Never done   Diabetic kidney evaluation - Urine ACR  09/09/2015   HEMOGLOBIN A1C  05/14/2020   COVID-19 Vaccine (6 - Pfizer series) 06/03/2021    Colorectal cancer screening: No longer required.   Lung Cancer Screening: (Low Dose CT Chest recommended if Age 21-80 years, 30 pack-year currently smoking OR have quit w/in 15years.) does not qualify.   Lung Cancer Screening Referral: NO  Additional Screening:  Hepatitis C Screening: does qualify; Completed 04/12/2018  Vision Screening: Recommended annual ophthalmology exams for early detection of glaucoma and other disorders of the eye. Is the patient up to date with their annual eye exam?  Yes  Who is the provider or what is the name of the office in which the patient attends annual eye exams? Jola Schmidt, MD. If pt is not established with a provider, would they like to be referred to a provider to establish care? No .   Dental Screening: Recommended annual dental exams for proper oral hygiene  Community Resource Referral / Chronic Care Management: CRR required this visit?  No   CCM required this visit?  No      Plan:     I have personally reviewed and noted the following in the patient's chart:   Medical and social history Use of alcohol, tobacco or illicit drugs  Current medications and supplements including opioid prescriptions. Patient is not currently taking opioid prescriptions. Functional ability and  status Nutritional status Physical activity Advanced directives List of other physicians Hospitalizations, surgeries, and ER visits in previous 12 months Vitals Screenings to include cognitive, depression, and falls Referrals and appointments  In addition, I have reviewed and discussed with patient certain preventive protocols, quality metrics, and best practice recommendations. A written personalized care plan for preventive services as well as general preventive health recommendations were provided to patient.     Sheral Flow, LPN   [id number]   Nurse Notes:  Patient has current diagnosis of cognitive impairment. Patient is followed by neurology for ongoing assessment.  Patient is unable to complete screening 6CIT or MMSE.  There were no vitals filed for this visit. There is no height or weight on file to calculate BMI.

## 2021-10-24 ENCOUNTER — Other Ambulatory Visit: Payer: Self-pay | Admitting: Internal Medicine

## 2021-10-24 DIAGNOSIS — D520 Dietary folate deficiency anemia: Secondary | ICD-10-CM

## 2021-11-03 ENCOUNTER — Ambulatory Visit: Payer: Medicare Other | Admitting: Adult Health

## 2021-11-09 ENCOUNTER — Encounter: Payer: Self-pay | Admitting: Adult Health

## 2021-11-09 ENCOUNTER — Ambulatory Visit (INDEPENDENT_AMBULATORY_CARE_PROVIDER_SITE_OTHER): Payer: Medicare Other | Admitting: Adult Health

## 2021-11-09 VITALS — BP 126/57 | HR 53 | Ht 64.0 in | Wt 119.0 lb

## 2021-11-09 DIAGNOSIS — I2510 Atherosclerotic heart disease of native coronary artery without angina pectoris: Secondary | ICD-10-CM | POA: Diagnosis not present

## 2021-11-09 DIAGNOSIS — G309 Alzheimer's disease, unspecified: Secondary | ICD-10-CM | POA: Diagnosis not present

## 2021-11-09 DIAGNOSIS — F0280 Dementia in other diseases classified elsewhere without behavioral disturbance: Secondary | ICD-10-CM

## 2021-11-09 NOTE — Patient Instructions (Addendum)
Your Plan:  Continue to monitor memory  Continue Aricept and Namenda     Thank you for coming to see [place] at [facility]. I hope we have been able to provide you high quality care today.  You may receive a patient satisfaction survey over the next few weeks. We would appreciate your feedback and comments so that we may continue to improve ourselves and the health of our patients.

## 2021-11-09 NOTE — Progress Notes (Signed)
PATIENT: Bruce Jordan. DOB: October 06, 1945  REASON FOR VISIT: follow up HISTORY FROM: patient PRIMARY NEUROLOGIST: Dr. Lucia Gaskins  Chief Complaint  Patient presents with   Follow-up    Rm 20 with wife- re     HISTORY OF PRESENT ILLNESS: Today 11/09/21:  Bruce Jordan is a 76 year old male with a history of memory disturbance most consistent with alzheimer disease. He returns today for follow-up. Lives with his wife. Able to complete ADLs independently. Completes household chores- makes his bed. Wife manages meds and appointments. Denies any trouble sleeping. Drives to familiar places close to home.   04/29/21: Bruce Jordan is a 76 year old male with a history of memory disturbance most consistent with Alzheimer's disease.  He returns today with his wife.  He continues to live at home with her.  Reports that he is able to complete all ADLs independently.  He completes household chores.  She does help him with his medications and appointments.  Denies any trouble sleeping.  No change in mood or behavior.  He continues to operate a motor vehicle but reports that he only drives to places close to where he lives.  His wife notes that she has noticed no issues with his driving.  He returns today for an evaluation.  HISTORY (copied from Dr. Trevor Bruce Jordan note)  Interval history 12/03/2020: At last appointment we found B12 133. I don't see an MRI completed. Last B12 normal 10/2020. Folate was low however at 4.7. bmp with slightly reduced gfr. Cbc unremarkable except plts 140 and lymphopenia. He did not follow up after last appointment. PCP has addressed the folate and asked him to supplement. Memory is getting worse. Repeating the formal memory testing. Dr. Clayborn Heron. MRI brain. Sister recently diagnosed with dementia, they think Alzheimers. He is still very social, walks every day, no falls, no physical changes, no depression. See prior extensive History below.    HPI 06/21/2018:  Bruce Jordan. is a  76 y.o. male here as requested by Etta Grandchild, MD for memory loss. PMHx osteoarthritis, impaired fasting glucose, hypertension, asthma, bradycardia, depression, hyperlipidemia, coronary artery disease, chronic ischemic heart disease.  He is here with his wife who provides much information.He doesn't remember things he used to remember. Started 3 years and slowly progressive. He was working in [place] and when he came home he had a difficult time using the remote. It is more short-term memory. If someone tells him something he forgets, such as conversations, forgets appointments, he remembers birthdays, wife has always taken care of the bills for 33 years, he is still driving , no accidents or getting lost, he repeats questions and stories in the same day such as what time they are closing will ask 4-5 times in one day. His coworker has noticed. He doesn't know his family history and unclear if there is a history of dementia, he has hearing impairment. More short-term memory loss. He has an older sister without any memory loss. They have 1-2 glasses of alcohol a day. Still having difficulty with the remote but it is new, he still works with tools, he cooks, he makes lunch and brings it to his wife, he will make grilled cheese, he is more beligerant than he used to be, he works at [employer] and he will be more irritable and confrontational but not inappropriate behavior, no delusions or hallucinations. He feels hearing loss may be a factor as well. He is on an antidepressant and 3 years  ago he had weight loss and wanted to sleep all the time and he feels better he does not feel sad most of the time. Socially nothing has changed. He takes his own medications but on a holiday it confused him when his edication was not in the right spots and wife had to help.No significant history of smoking. He feels refreshed in the morning, no snoring. wife provides much information. No other focal neurologic deficits, associated  symptoms, inciting events or modifiable factors.   Reviewed notes, labs and imaging from outside physicians, which showed:   Reviewed Dr. Ronnald Ramp notes.  Patient and wife complaining about short-term memory and has been declining over the last few years.  Denied any other associated events REVIEW OF SYSTEMS: Out of a complete 14 system review of symptoms, the patient complains only of the following symptoms, and all other reviewed systems are negative.  ALLERGIES: No Known Allergies  HOME MEDICATIONS: Outpatient Medications Prior to Visit  Medication Sig Dispense Refill   albuterol (PROVENTIL HFA;VENTOLIN HFA) 108 (90 Base) MCG/ACT inhaler Inhale 1-2 puffs into the lungs every 6 (six) hours as needed for wheezing or shortness of breath. 18 g 3   Ascorbic Acid (VITAMIN C) 100 MG tablet Take 100 mg by mouth daily.     aspirin EC 81 MG tablet Take 81 mg by mouth daily.     atorvastatin (LIPITOR) 40 MG tablet Take 1 tablet (40 mg total) by mouth daily. 90 tablet 1   Cetirizine HCl (ZYRTEC PO) Take 10 mg by mouth daily.     cholecalciferol (VITAMIN D) 400 units TABS tablet Take 400 Units by mouth daily.     citalopram (CELEXA) 10 MG tablet Take 1 tablet (10 mg total) by mouth daily. 90 tablet 1   clobetasol cream (TEMOVATE) [id number] % Apply 1 application topically 2 (two) times daily as needed (rash). 60 g 3   diclofenac sodium (VOLTAREN) 1 % GEL Apply 2 g topically 4 (four) times daily as needed (pain).      donepezil (ARICEPT) 10 MG tablet Take 1 tablet (10 mg total) by mouth at bedtime. 90 tablet 3   EPINEPHrine (EPIPEN IJ) Inject as directed as directed.     ferrous sulfate 325 (65 FE) MG tablet Take 325 mg by mouth daily with breakfast.     folic acid (FOLVITE) 1 MG tablet TAKE 1 TABLET BY MOUTH EVERY OTHER DAY 45 tablet 0   lisinopril (ZESTRIL) 20 MG tablet Take 1 tablet by mouth once daily 90 tablet 0   memantine (NAMENDA) 10 MG tablet Take 1 tablet (10 mg total) by mouth 2 (two) times daily.  180 tablet 3   Misc Natural Products (TART CHERRY ADVANCED PO) Take by mouth.     sildenafil (VIAGRA) 100 MG tablet TAKE 1 TABLET BY MOUTH ONCE DAILY AS NEEDED FOR ERECTILE DYSFUNCTION 5 tablet 3   TURMERIC PO Take by mouth.     montelukast (SINGULAIR) 10 MG tablet Take 1 tablet (10 mg total) by mouth at bedtime. 90 tablet 1   prednisoLONE acetate (PRED MILD) 0.12 % ophthalmic suspension Place 1 drop into both eyes 4 (four) times daily. 5 mL 0   No facility-administered medications prior to visit.    PAST MEDICAL HISTORY: Past Medical History:  Diagnosis Date   Benign prostatic hyperplasia without lower urinary tract symptoms 05/25/2016   Bradycardia, severe sinus 09/12/2017   Chronic ischemic heart disease    Chronic ITP (idiopathic thrombocytopenia) 05/16/2019   Coronary artery disease  involving native coronary artery of native heart without angina pectoris 05/25/2016   Dementia due to Alzheimer's disease, possible 12/15/2020   Dietary folate deficiency anemia 05/16/2019   Eczema 05/25/2016   Erectile dysfunction due to arterial insufficiency 99991111   Erosive oral lichen planus 0000000   Essential hypertension 05/25/2016   Estimated Creatinine Clearance: 39.4 mL/min (by C-G formula based on SCr of 1.41 mg/dL).   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   Vitamin B12 deficiency anemia 12/04/2018   Word finding difficulty 10/21/2020    PAST SURGICAL HISTORY: Past Surgical History:  Procedure Laterality Date    CARDIAC CATHETERIZATION      FAMILY HISTORY: Family History  Problem Relation Age of Onset   Hypertension Mother    Heart disease Mother    Heart disease Father    Cancer Father    Alzheimer's disease Sister    Dementia Sister    Early death Brother 21       MVA   Stroke Brother    Early death Brother 41       Epilepsy    SOCIAL HISTORY: Social History   Socioeconomic History   Marital status: Married    Spouse name: Not on file   Number of children: Not on file   Years of education: 12   Highest education level: High school graduate  Occupational History   Occupation: Retired    Comment: Mechanic/maintenance  Tobacco Use   Smoking status: Former   Smokeless tobacco: Never  Scientific laboratory technician Use: Never used  Substance and Sexual Activity   Alcohol use: Yes    Comment: a cocktail once in a while   Drug use: No   Sexual activity: Yes    Birth control/protection: None  Other Topics Concern   Not on file  Social History Narrative   Lives at home with wife    Right handed   Caffeine: 2 cups/day of coke    Social Determinants of Health   Financial Resource Strain: Low Risk  (10/22/2021)   Overall Financial Resource Strain (CARDIA)    Difficulty of Paying Living Expenses: Not hard at all  Food Insecurity: No Food Insecurity (10/22/2021)   Hunger Vital Sign    Worried About Running Out of Food in the Last Year: Never true    Ran Out of Food in the Last Year: Never true  Transportation Needs: No Transportation Needs (10/22/2021)   PRAPARE - Hydrologist (Medical): No    Lack of Transportation (Non-Medical): No  Physical Activity: Sufficiently Active (10/22/2021)   Exercise Vital Sign    Days of Exercise per Week: 5 days    Minutes of Exercise per Session: 30 min  Stress: No Stress Concern Present (10/22/2021)   [place]    Feeling of Stress : Not at all  Social  Connections: [place] (10/22/2021)   Social Connection and Isolation Panel [NHANES]    Frequency of Communication with Friends and  Family: More than three times a week    Frequency of Social Gatherings with Friends and Family: Three times a week    Attends Religious Services: More than 4 times per year    Active Member of Clubs or Organizations: No    Attends Banker Meetings: More than 4 times per year    Marital Status: Married  Catering manager Violence: Not At Risk (10/22/2021)   Humiliation, Afraid, Rape, and Kick questionnaire    Fear of Current or Ex-Partner: No    Emotionally Abused: No    Physically Abused: No    Sexually Abused: No      PHYSICAL EXAM  Vitals:   11/09/21 1323  BP: (!) 126/57  Pulse: (!) 53  Weight: 119 lb (54 kg)  Height: 5\' 4"  (1.626 m)   Body mass index is 20.43 kg/m.     11/09/2021    1:32 PM 10/22/2021    3:40 PM 04/29/2021    3:04 PM  MMSE - Mini Mental State Exam  Not completed:  Unable to complete   Orientation to time 2  1  Orientation to Place 1  4  Registration 3  3  Attention/ Calculation 0  1  Recall 2  0  Language- name 2 objects 2  2  Language- repeat 1  1  Language- follow 3 step command 3  3  Language- read & follow direction 1  1  Write a sentence 1  1  Copy design 0  1  Total score 16  18     Generalized: Well developed, in no acute distress   Neurological examination  Mentation: Alert oriented to time, place, history taking. Follows all commands speech and language fluent Cranial nerve II-XII: Pupils were equal round reactive to light. Extraocular movements were full, visual field were full on confrontational test. Facial sensation and strength were normal.  Head turning and shoulder shrug  were normal and symmetric. Motor: The motor testing reveals 5 over 5 strength of all 4 extremities. Good symmetric motor tone is noted throughout.  Sensory: Sensory testing is intact to soft touch on all 4  extremities. No evidence of extinction is noted.  Coordination: Cerebellar testing reveals good finger-nose-finger and heel-to-shin bilaterally.  Gait and station: Gait is normal.    DIAGNOSTIC DATA (LABS, IMAGING, TESTING) - I reviewed patient records, labs, notes, testing and imaging myself where available.  Lab Results  Component Value Date   WBC 5.7 05/31/2021   HGB 13.6 05/31/2021   HCT 40.2 05/31/2021   MCV 93.6 05/31/2021   PLT 157.0 05/31/2021      Component Value Date/Time   NA 136 01/20/2021 0829   K 4.8 01/20/2021 0829   CL 102 01/20/2021 0829   CO2 21 01/20/2021 0829   GLUCOSE 101 (H) 01/20/2021 0829   GLUCOSE 140 (H) 12/04/2020 1926   BUN 23 01/20/2021 0829   CREATININE 1.59 (H) 01/20/2021 0829   CREATININE 1.41 (H) 11/12/2019 0932   CALCIUM 9.4 01/20/2021 0829   PROT 6.5 01/20/2021 0829   ALBUMIN 4.5 01/20/2021 0829   AST 21 01/20/2021 0829   ALT 15 01/20/2021 0829   ALKPHOS 104 01/20/2021 0829   BILITOT 0.4 01/20/2021 0829   GFRNONAA 51 (L) 12/04/2020 1926   GFRNONAA 48 (L) 11/12/2019 0932   GFRAA 56 (L) 11/12/2019 0932   Lab Results  Component Value Date   CHOL 107 01/20/2021   HDL 45 01/20/2021   LDLCALC 46 01/20/2021   LDLDIRECT  77.0 10/04/2017   TRIG 82 01/20/2021   CHOLHDL 2.4 01/20/2021   Lab Results  Component Value Date   HGBA1C 5.4 11/12/2019   Lab Results  Component Value Date   [id number] 10/20/2020   Lab Results  Component Value Date   TSH 3.57 11/12/2019      ASSESSMENT AND PLAN 76 y.o. year old male  has a past medical history of Benign prostatic hyperplasia without lower urinary tract symptoms (05/25/2016), Bradycardia, severe sinus (09/12/2017), Chronic ischemic heart disease, Chronic ITP (idiopathic thrombocytopenia) (05/16/2019), Coronary artery disease involving native coronary artery of native heart without angina pectoris (05/25/2016), Dementia due to Alzheimer's disease, possible (12/15/2020), Dietary folate  deficiency anemia (05/16/2019), Eczema (05/25/2016), Erectile dysfunction due to arterial insufficiency (99991111), Erosive oral lichen planus (0000000), Essential hypertension (05/25/2016), Hyperlipidemia (05/25/2016), IFG (impaired fasting glucose), Iron deficiency anemia due to chronic blood loss (05/25/2016), Major depressive disorder (08/01/2014), Mild intermittent asthma with acute exacerbation (09/12/2017), Osteoarthritis, Prepatellar bursitis, right knee (02/24/2020), Primary osteoarthritis of both shoulders (05/25/2016), S/P CABG x 1 (05/14/2013), Stage 3b chronic kidney disease (05/16/2019), Type 2 diabetes mellitus with diabetic nephropathy (09/09/2014), Vitamin B12 deficiency anemia (12/04/2018), and Word finding difficulty (10/21/2020). here with:  1.  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  Advised wife she can call and we will send in 10 mg tablet Discussed driving-recommended that driving be restricted.  Patient and wife verbalized understanding. Follow-up in 6 months or sooner if needed     Ward Givens, MSN, NP-C 11/09/2021, 1:38 PM [facility] [address], [place], [place] [address] [phone]

## 2022-01-10 ENCOUNTER — Other Ambulatory Visit: Payer: Self-pay | Admitting: Internal Medicine

## 2022-01-10 DIAGNOSIS — D520 Dietary folate deficiency anemia: Secondary | ICD-10-CM

## 2022-01-10 DIAGNOSIS — E785 Hyperlipidemia, unspecified: Secondary | ICD-10-CM

## 2022-01-10 DIAGNOSIS — I10 Essential (primary) hypertension: Secondary | ICD-10-CM

## 2022-01-10 DIAGNOSIS — I2510 Atherosclerotic heart disease of native coronary artery without angina pectoris: Secondary | ICD-10-CM

## 2022-01-12 NOTE — Progress Notes (Signed)
Cardiology Office Note    Date:  01/14/2022   ID:  Bruce Amenta., DOB Oct 21, 1945, MRN [id number]  PCP:  Bruce Lima, MD  Cardiologist:  Bruce Demonbreun [place], MD    History of Present Illness:  Bruce Parlette. is a 76 y.o. male seen for follow up CAD. He has a history of CAD s/p CABG in 2008 in [place] for failed PCI. This was a Jordan to the LAD. In 2013 he had PCI of a tortuous RCA. In April 2016 he had a low risk Cardiolite without evidence of ischemia. He has a history of HTN and dyslipidemia. 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  He moved to [place] to be closer to family. He lives in  [place] with his wife.  He reports he is doing well.  He denies any chest pain, palpitations, dizziness. He does note some SOB only if he overdoes it. Is doing a lot of chores around the house and yard. He has dementia and is now on Aricept and Namenda. He tries to walk some daily.    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 18 g 3   Ascorbic Acid (VITAMIN C) 100 MG tablet Take 100 mg by mouth daily.     aspirin EC 81 MG tablet Take 81 mg by mouth daily.     Cetirizine HCl (ZYRTEC PO) Take 10 mg by mouth daily.     cholecalciferol (VITAMIN D) 400 units TABS tablet Take 400 Units by mouth daily.     citalopram (CELEXA) 10 MG tablet Take 1 tablet (10 mg total) by mouth daily. 90 tablet 1   clobetasol cream (TEMOVATE) [id number] % Apply 1 application topically 2 (two) times daily as needed (rash). 60 g 3   Cyanocobalamin (B-12) 1000 MCG CAPS      diclofenac sodium (VOLTAREN) 1 % GEL Apply 2 g topically 4 (four) times  daily as needed (pain).      donepezil (ARICEPT) 10 MG tablet Take 1 tablet (10 mg total) by mouth at bedtime. 90 tablet 3   EPINEPHrine (EPIPEN IJ) Inject as directed as directed.     ferrous sulfate 325 (65 FE) MG tablet Take 325 mg by mouth daily with breakfast.     folic acid (FOLVITE) 1 MG tablet TAKE 1 TABLET BY MOUTH EVERY OTHER DAY 45 tablet 0   memantine (NAMENDA) 10 MG tablet Take 1 tablet (10 mg total) by mouth 2 (two) times daily. 180 tablet 3   Misc Natural Products (TART CHERRY ADVANCED PO) Take by mouth.     sildenafil (VIAGRA) 100 MG tablet TAKE 1 TABLET BY MOUTH ONCE DAILY AS NEEDED FOR ERECTILE DYSFUNCTION 5 tablet 3   TURMERIC PO  Take by mouth.     atorvastatin (LIPITOR) 40 MG tablet Take 1 tablet by mouth once daily 90 tablet 0   lisinopril (ZESTRIL) 20 MG tablet Take 1 tablet by mouth once daily 90 tablet 0   No facility-administered medications prior to visit.     Allergies:   Patient has no known allergies.   Social History   Socioeconomic History   Marital status: Married    Spouse name: Not on file   Number of children: Not on file   Years of education: 12   Highest education level: High school graduate  Occupational History   Occupation: Retired    Comment: Mechanic/maintenance  Tobacco Use   Smoking status: Former   Smokeless tobacco: Never  Scientific laboratory technician Use: Never used  Substance and Sexual Activity   Alcohol use: Yes    Comment: a cocktail once in a while   Drug use: No   Sexual activity: Yes    Birth control/protection: None  Other Topics Concern   Not on file  Social History Narrative   Lives at home with wife    Right handed   Caffeine: 2 cups/day of coke    Social Determinants of Health   Financial Resource Strain: [place]  (10/22/2021)   Overall Financial Resource Strain (CARDIA)    Difficulty of Paying Living Expenses: Not hard at all  Food Insecurity: No Food Insecurity (10/22/2021)   Hunger Vital Sign    Worried About Running Out of Food in the Last Year: Never true    [place] in the Last Year: Never true  Transportation Needs: No Transportation Needs (10/22/2021)   PRAPARE - Hydrologist (Medical): No    Lack of Transportation (Non-Medical): No  Physical Activity: Sufficiently Active (10/22/2021)   Exercise Vital Sign    Days of Exercise per Week: 5 days    Minutes of Exercise per Session: 30 min  Stress: No Stress Concern Present (10/22/2021)   [place]    Feeling of Stress : Not at all  Social Connections: [place] (10/22/2021)   Social Connection  and Isolation Panel [NHANES]    Frequency of Communication with Friends and Family: More than three times a week    Frequency of Social Gatherings with Friends and Family: Three times a week    Attends Religious Services: More than 4 times per year    Active Member of Clubs or Organizations: No    Attends Music therapist: More than 4 times per year    Marital Status: Married     Family History:  The patient's family history includes Alzheimer's disease in his sister; Cancer in his father; Dementia in his sister; Early death (age of onset: 25) in his brother; Early death (age of onset: 73) in his brother; Heart disease in his father and mother; Hypertension in his mother; Stroke in his brother.   ROS:   Please see the history of present illness.    ROS All other systems reviewed and are negative.   PHYSICAL EXAM:   VS:  BP 134/64 (BP Location: Left Arm, Patient Position: Sitting, Cuff Size: Normal)   Pulse (!) 50   Ht 5\' 5"  (1.651 m)   Wt 128 lb 12.8 oz (58.4 kg)   SpO2 98%   BMI 21.43 kg/m    GENERAL:  Well appearing WM in NAD HEENT:  PERRL, EOMI, sclera are clear. Oropharynx is clear. NECK:  No jugular venous distention, carotid upstroke brisk and symmetric, no bruits, no thyromegaly or adenopathy LUNGS:  Clear to auscultation bilaterally CHEST:  Unremarkable HEART:  RRR with extrasystoles,  PMI not displaced or sustained,S1 and S2 within normal limits, no S3, no S4: no clicks, no rubs, no murmurs ABD:  Soft, nontender. BS +, no masses or bruits. No hepatomegaly, no splenomegaly EXT:  2 + pulses throughout, no edema, no cyanosis no clubbing SKIN:  Warm and dry.  No rashes NEURO:  Alert and oriented x 3. Cranial nerves II through XII intact. PSYCH:  Cognitively intact   Wt Readings from Last 3 Encounters:  01/14/22 128 lb 12.8 oz (58.4 kg)  11/09/21 119 lb (54 kg)  05/31/21 127 lb 6.4 oz (57.8 kg)      Studies/Labs Reviewed:   EKG:  EKG is  ordered today.   Sinus brady with rate 50,  LAD.I have personally reviewed and interpreted this study.    Recent Labs: 01/20/2021: ALT 15; BUN 23; Creatinine, Ser 1.59; Potassium 4.8; Sodium 136 05/31/2021: Hemoglobin 13.6; Platelets 157.0   Lipid Panel    Component Value Date/Time   CHOL 107 01/20/2021 0829   TRIG 82 01/20/2021 0829   HDL 45 01/20/2021 0829   CHOLHDL 2.4 01/20/2021 0829   CHOLHDL 3.2 11/12/2019 0932   VLDL 35.8 12/04/2018 1534   LDLCALC 46 01/20/2021 0829   LDLCALC 81 11/12/2019 0932   LDLDIRECT 77.0 10/04/2017 1607    Additional studies/ records that were reviewed today include:  Labs dated 08/24/15: glucose 177, creatinine 1.47. Otherwise CMET normal. Platelets 118K, otherwise CBC is normal. A1c 5.9%. TSH normal. Cholesterol 170, triglycerides 119, LDL 85, HDL 61. LDL particle number [id number]. Dated 05/26/16: cholesterol 144, triglycerides 98, HDL 62, LDL 62. A1c 5.7%. Creatinine 1.27. Hgb, TSH, ALT normal. Dated 10/04/17: cholesterol 162, triglycerides 240, HDL 70, LDL 77. A1c 5.7%. Creatinine 1.41. ALT normal. CBC normal.   ASSESSMENT:   CAD s/p CABG in 2008. Last Myoview in 2016 looked good. He is asymptomatic. Continue medical therapy with ASA, statin. HTN well controlled continue  lisinopril Hypercholesterolemia. On high dose statin. Will check labs today CKD stage 3. Check chemistries today Dementia - followed by Neurology.  Follow up in one year.    Medication Adjustments/Labs and Tests Ordered: Current medicines are reviewed at length with the patient today.  Concerns regarding medicines are outlined above.  Medication changes, Labs and Tests ordered today are listed in the Patient Instructions below. There are no Patient Instructions on file for this visit.   Signed, Milanna Kozlov Martin[place], MD  01/14/2022 9:32 AM    [facility]  [address], [place], [place], [address] [phone]

## 2022-01-14 ENCOUNTER — Ambulatory Visit: Payer: Medicare Other | Attending: Cardiology | Admitting: Cardiology

## 2022-01-14 ENCOUNTER — Encounter: Payer: Self-pay | Admitting: Cardiology

## 2022-01-14 DIAGNOSIS — I2510 Atherosclerotic heart disease of native coronary artery without angina pectoris: Secondary | ICD-10-CM | POA: Insufficient documentation

## 2022-01-14 DIAGNOSIS — I10 Essential (primary) hypertension: Secondary | ICD-10-CM | POA: Insufficient documentation

## 2022-01-14 DIAGNOSIS — E785 Hyperlipidemia, unspecified: Secondary | ICD-10-CM | POA: Insufficient documentation

## 2022-01-14 MED ORDER — LISINOPRIL 20 MG PO TABS: 20.0000 mg | ORAL_TABLET | Freq: Every day | ORAL | 3 refills | Status: DC

## 2022-01-14 MED ORDER — ATORVASTATIN CALCIUM 40 MG PO TABS: 40.0000 mg | ORAL_TABLET | Freq: Every day | ORAL | 3 refills | Status: DC

## 2022-01-15 LAB — BASIC METABOLIC PANEL
BUN/Creatinine Ratio: 15 (ref 10–24)
BUN: 24 mg/dL (ref 8–27)
CO2: 21 mmol/L (ref 20–29)
Calcium: 9.1 mg/dL (ref 8.6–10.2)
Chloride: 107 mmol/L — ABNORMAL HIGH (ref 96–106)
Creatinine, Ser: 1.63 mg/dL — ABNORMAL HIGH (ref 0.76–1.27)
Glucose: 92 mg/dL (ref 70–99)
Potassium: 4.4 mmol/L (ref 3.5–5.2)
Sodium: 144 mmol/L (ref 134–144)
eGFR: 43 mL/min/{1.73_m2} — ABNORMAL LOW (ref >59)

## 2022-01-15 LAB — LIPID PANEL
Chol/HDL Ratio: 3.6 ratio (ref 0.0–5.0)
Cholesterol, Total: 181 mg/dL (ref 100–199)
HDL: 50 mg/dL (ref >39)
LDL Chol Calc (NIH): 115 mg/dL — ABNORMAL HIGH (ref 0–99)
Triglycerides: 87 mg/dL (ref 0–149)
VLDL Cholesterol Cal: 16 mg/dL (ref 5–40)

## 2022-01-15 LAB — HEPATIC FUNCTION PANEL
ALT: 11 IU/L (ref 0–44)
AST: 15 IU/L (ref 0–40)
Albumin: 4.1 g/dL (ref 3.8–4.8)
Alkaline Phosphatase: 92 IU/L (ref 44–121)
Bilirubin Total: 0.5 mg/dL (ref 0.0–1.2)
Bilirubin, Direct: 0.11 mg/dL (ref 0.00–0.40)
Total Protein: 6 g/dL (ref 6.0–8.5)

## 2022-01-18 ENCOUNTER — Other Ambulatory Visit: Payer: Self-pay

## 2022-01-18 DIAGNOSIS — E785 Hyperlipidemia, unspecified: Secondary | ICD-10-CM

## 2022-01-18 DIAGNOSIS — I2510 Atherosclerotic heart disease of native coronary artery without angina pectoris: Secondary | ICD-10-CM

## 2022-01-18 MED ORDER — EZETIMIBE 10 MG PO TABS: 10.0000 mg | ORAL_TABLET | Freq: Every day | ORAL | 3 refills | Status: DC

## 2022-01-25 ENCOUNTER — Other Ambulatory Visit: Payer: Self-pay | Admitting: Neurology

## 2022-02-04 DIAGNOSIS — Z23 Encounter for immunization: Secondary | ICD-10-CM | POA: Diagnosis not present

## 2022-03-22 ENCOUNTER — Other Ambulatory Visit: Payer: Self-pay | Admitting: Internal Medicine

## 2022-03-22 DIAGNOSIS — F418 Other specified anxiety disorders: Secondary | ICD-10-CM

## 2022-04-17 ENCOUNTER — Encounter: Payer: Self-pay | Admitting: Adult Health

## 2022-04-19 ENCOUNTER — Other Ambulatory Visit: Payer: Self-pay | Admitting: Internal Medicine

## 2022-04-19 ENCOUNTER — Other Ambulatory Visit: Payer: Self-pay | Admitting: Adult Health

## 2022-04-19 DIAGNOSIS — D520 Dietary folate deficiency anemia: Secondary | ICD-10-CM

## 2022-04-25 ENCOUNTER — Encounter: Payer: Self-pay | Admitting: Internal Medicine

## 2022-04-25 NOTE — Progress Notes (Unsigned)
    Subjective:    Patient ID: Bruce Patti., male    DOB: Mar 19, 1946, 77 y.o.   MRN: [id number]      HPI Bruce Jordan is here for No chief complaint on file.    Headaches - daily headaches for one month, maybe more.     Normal brain mri 12/18/2020   Medications and allergies reviewed with patient and updated if appropriate.  Current Outpatient Medications on File Prior to Visit  Medication Sig Dispense Refill   albuterol (PROVENTIL HFA;VENTOLIN HFA) 108 (90 Base) MCG/ACT inhaler Inhale 1-2 puffs into the lungs every 6 (six) hours as needed for wheezing or shortness of breath. 18 g 3   Ascorbic Acid (VITAMIN C) 100 MG tablet Take 100 mg by mouth daily.     aspirin EC 81 MG tablet Take 81 mg by mouth daily.     atorvastatin (LIPITOR) 40 MG tablet Take 1 tablet (40 mg total) by mouth daily. 90 tablet 3   Cetirizine HCl (ZYRTEC PO) Take 10 mg by mouth daily.     cholecalciferol (VITAMIN D) 400 units TABS tablet Take 400 Units by mouth daily.     citalopram (CELEXA) 10 MG tablet Take 1 tablet by mouth once daily 90 tablet 0   clobetasol cream (TEMOVATE) 7.12 % Apply 1 application topically 2 (two) times daily as needed (rash). 60 g 3   Cyanocobalamin (B-12) 1000 MCG CAPS      diclofenac sodium (VOLTAREN) 1 % GEL Apply 2 g topically 4 (four) times daily as needed (pain).      donepezil (ARICEPT) 10 MG tablet TAKE 1 TABLET BY MOUTH AT BEDTIME 90 tablet 1   EPINEPHrine (EPIPEN IJ) Inject as directed as directed.     ezetimibe (ZETIA) 10 MG tablet Take 1 tablet (10 mg total) by mouth daily. 90 tablet 3   ferrous sulfate 325 (65 FE) MG tablet Take 325 mg by mouth daily with breakfast.     folic acid (FOLVITE) 1 MG tablet TAKE 1 TABLET BY MOUTH EVERY OTHER DAY 45 tablet 0   lisinopril (ZESTRIL) 20 MG tablet Take 1 tablet (20 mg total) by mouth daily. 90 tablet 3   memantine (NAMENDA) 10 MG tablet Take 1 tablet by mouth twice daily 180 tablet 0   Misc Natural Products (TART CHERRY ADVANCED  PO) Take by mouth.     sildenafil (VIAGRA) 100 MG tablet TAKE 1 TABLET BY MOUTH ONCE DAILY AS NEEDED FOR ERECTILE DYSFUNCTION 5 tablet 3   TURMERIC PO Take by mouth.     No current facility-administered medications on file prior to visit.    Review of Systems     Objective:  There were no vitals filed for this visit. BP Readings from Last 3 Encounters:  01/14/22 134/64  11/09/21 (!) 126/57  05/31/21 122/68   Wt Readings from Last 3 Encounters:  01/14/22 128 lb 12.8 oz (58.4 kg)  11/09/21 119 lb (54 kg)  05/31/21 127 lb 6.4 oz (57.8 kg)   There is no height or weight on file to calculate BMI.    Physical Exam         Assessment & Plan:    See Problem List for Assessment and Plan of chronic medical problems.

## 2022-04-26 ENCOUNTER — Ambulatory Visit (INDEPENDENT_AMBULATORY_CARE_PROVIDER_SITE_OTHER): Payer: Medicare Other | Admitting: Internal Medicine

## 2022-04-26 ENCOUNTER — Encounter: Payer: Self-pay | Admitting: Internal Medicine

## 2022-04-26 VITALS — BP 120/72 | HR 50 | Temp 97.9°F | Ht 65.0 in | Wt 129.0 lb

## 2022-04-26 DIAGNOSIS — R519 Headache, unspecified: Secondary | ICD-10-CM

## 2022-04-26 DIAGNOSIS — E785 Hyperlipidemia, unspecified: Secondary | ICD-10-CM | POA: Diagnosis not present

## 2022-04-26 DIAGNOSIS — I2510 Atherosclerotic heart disease of native coronary artery without angina pectoris: Secondary | ICD-10-CM | POA: Diagnosis not present

## 2022-04-26 LAB — LIPID PANEL
Chol/HDL Ratio: 2 ratio (ref 0.0–5.0)
Cholesterol, Total: 91 mg/dL — ABNORMAL LOW (ref 100–199)
HDL: 46 mg/dL (ref >39)
LDL Chol Calc (NIH): 29 mg/dL (ref 0–99)
Triglycerides: 78 mg/dL (ref 0–149)
VLDL Cholesterol Cal: 16 mg/dL (ref 5–40)

## 2022-04-26 LAB — HEPATIC FUNCTION PANEL
ALT: 22 IU/L (ref 0–44)
AST: 16 IU/L (ref 0–40)
Albumin: 4.5 g/dL (ref 3.8–4.8)
Alkaline Phosphatase: 128 IU/L — ABNORMAL HIGH (ref 44–121)
Bilirubin Total: 0.8 mg/dL (ref 0.0–1.2)
Bilirubin, Direct: 0.23 mg/dL (ref 0.00–0.40)
Total Protein: 6.6 g/dL (ref 6.0–8.5)

## 2022-04-26 MED ORDER — PREDNISONE 10 MG PO TABS: ORAL_TABLET | ORAL | 0 refills | Status: DC

## 2022-04-26 NOTE — Patient Instructions (Addendum)
Medications changes include :  prednisone 20 mg daily x 3 days, then 10 mg daily x 3 days    A ct scan of your head was ordered.    Return if symptoms worsen or fail to improve.

## 2022-04-28 ENCOUNTER — Ambulatory Visit
Admission: RE | Admit: 2022-04-28 | Discharge: 2022-04-28 | Disposition: A | Discharge status: Home / Self Care | Payer: Medicare Other | Source: Ambulatory Visit | Attending: Internal Medicine | Admitting: Internal Medicine

## 2022-04-28 DIAGNOSIS — R519 Headache, unspecified: Secondary | ICD-10-CM | POA: Diagnosis not present

## 2022-05-07 IMAGING — CT CT RENAL STONE PROTOCOL
2 of 5 series · 16 of 46 positions shown, 18 images · non-contrast
Comparison: None.

CLINICAL DATA: Hematuria.

EXAM:
CT ABDOMEN AND PELVIS WITHOUT CONTRAST
TECHNIQUE: Multidetector CT imaging of the abdomen and pelvis was performed
following the standard protocol without IV contrast.

[Series 4: renal stone 1.0 · axial · 0.76mm/px · z∈[+844,+1251]mm · 13 of 445 slices shown, 15 images]
[im 19/445  soft-tissue]
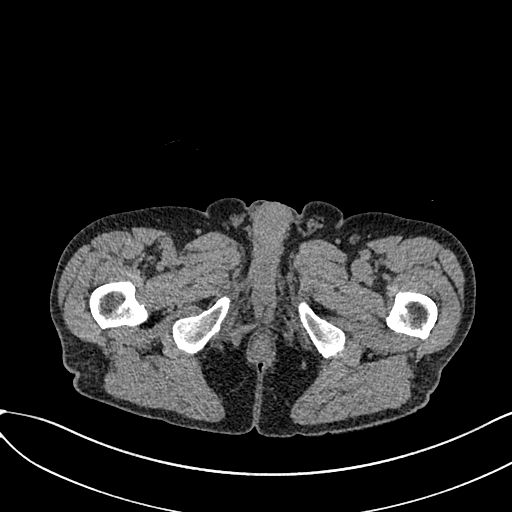
[im 19/445  bone]
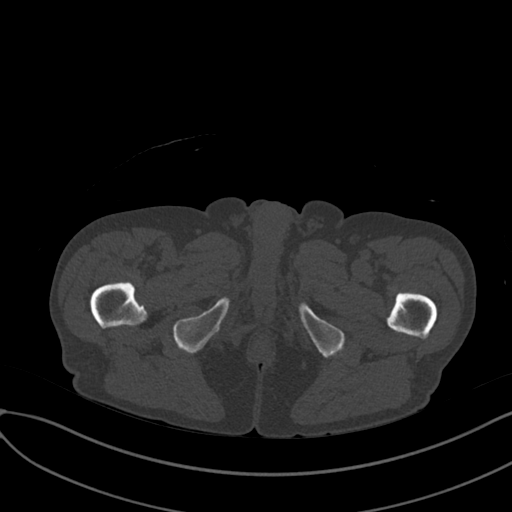
[im 56/445  soft-tissue]
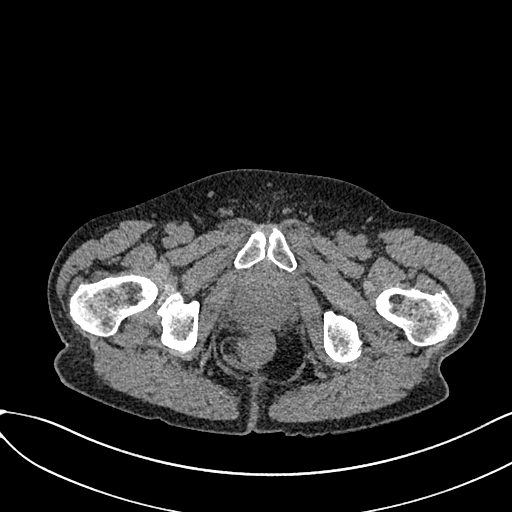
[im 93/445  soft-tissue]
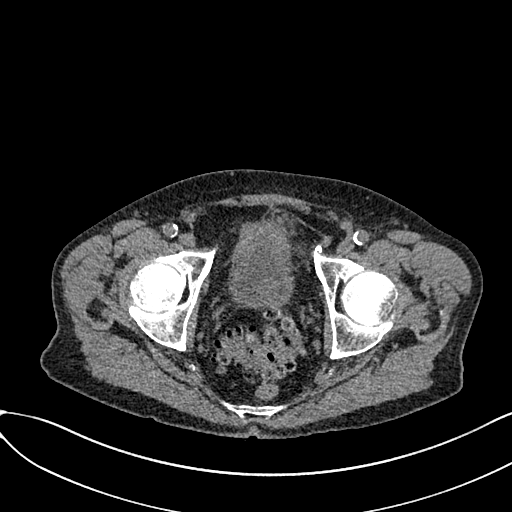
[im 130/445  soft-tissue]
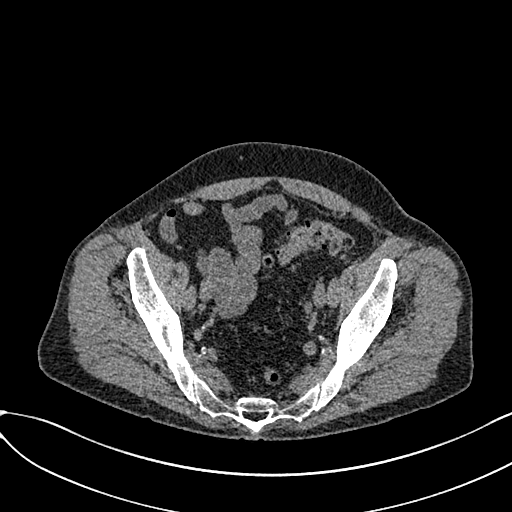
[im 149/445  soft-tissue]
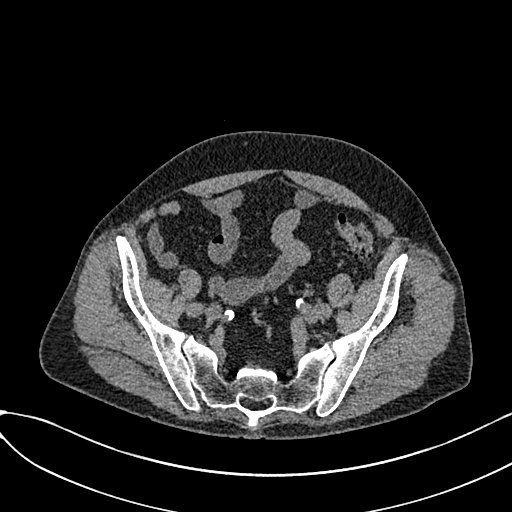
[im 186/445  soft-tissue]
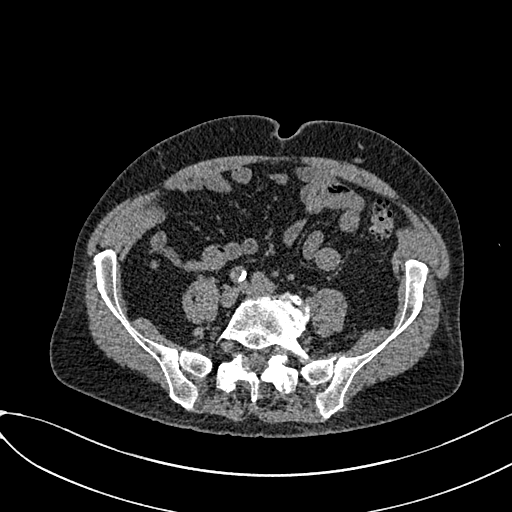
[im 223/445  soft-tissue]
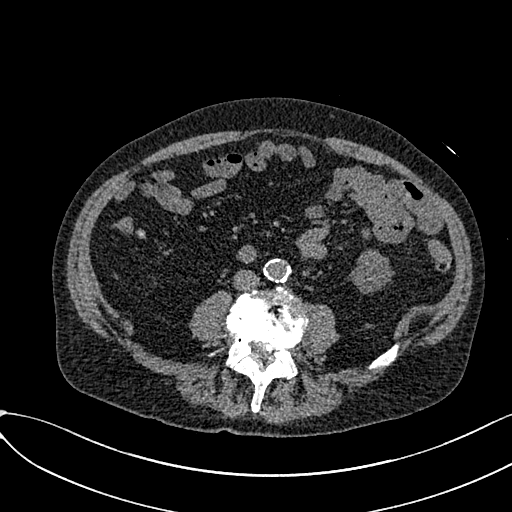
[im 260/445  soft-tissue]
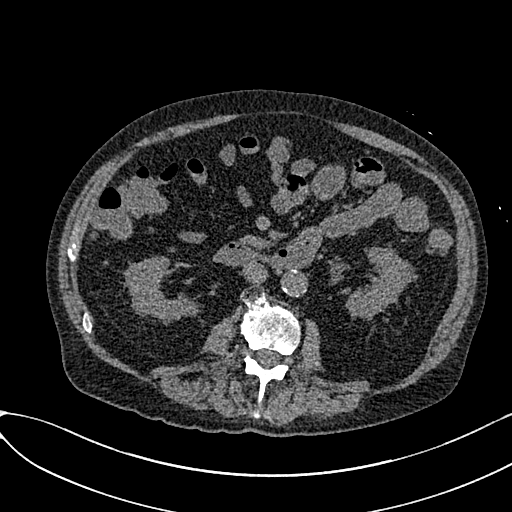
[im 297/445  soft-tissue]
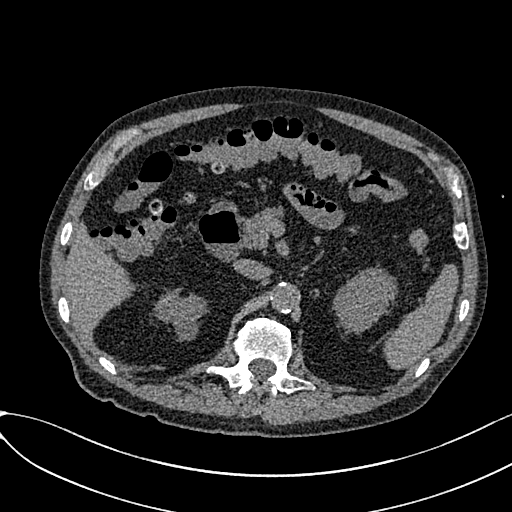
[im 297/445  bone]
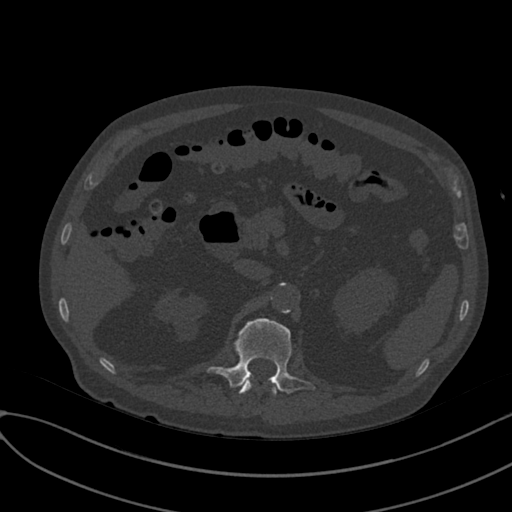
[im 315/445  soft-tissue]
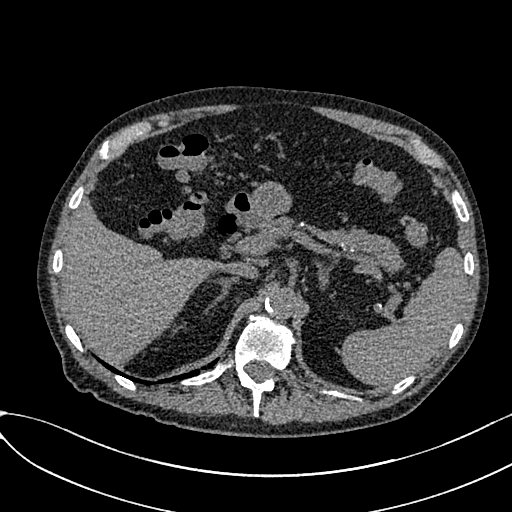
[im 352/445  soft-tissue]
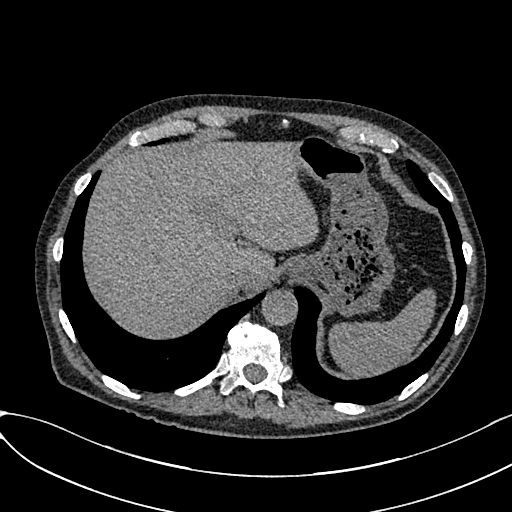
[im 389/445  soft-tissue]
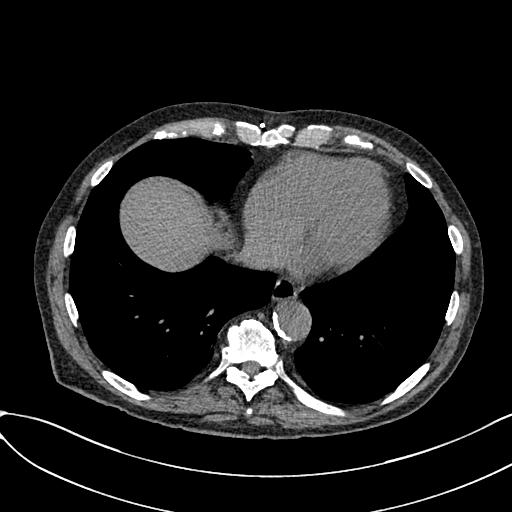
[im 426/445  soft-tissue]
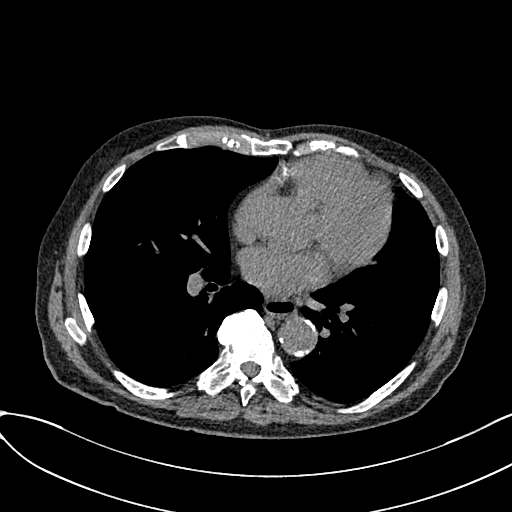

[Series 6: cor · coronal · 0.86mm/px · 3 of 147 slices shown]
[im 49/147  soft-tissue]
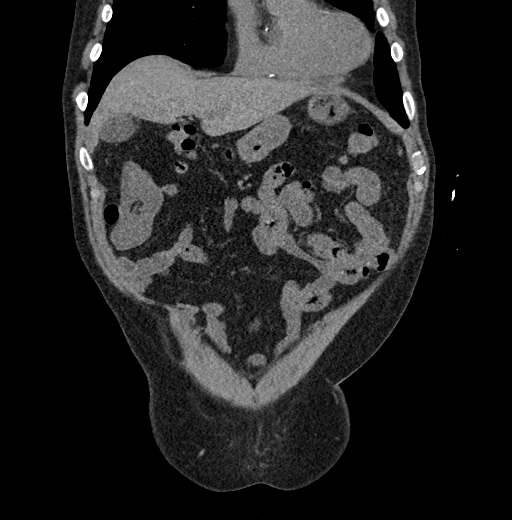
[im 65/147  soft-tissue]
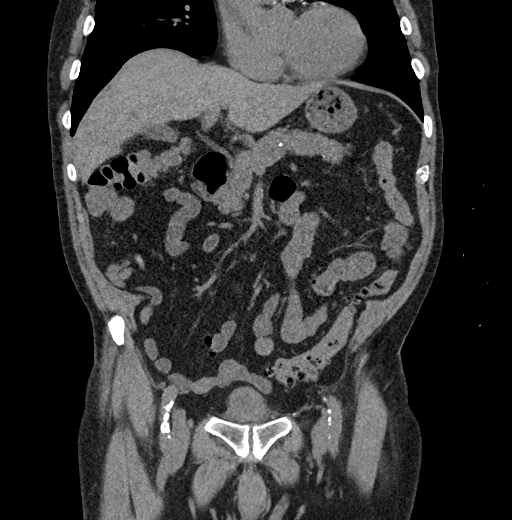
[im 82/147  soft-tissue]
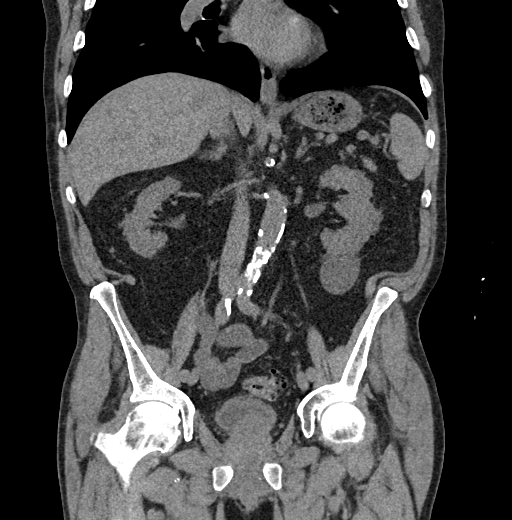

[16 of 46 positions shown; findings below may reference images not displayed]

FINDINGS: Evaluation of this exam is limited in the absence of intravenous
contrast.

Lower chest: The visualized lung bases are clear.

No intra-abdominal free air or free fluid.

Hepatobiliary: Subcentimeter right hepatic hypodense focus is too
small characterize. The liver is otherwise unremarkable. No
intrahepatic biliary ductal dilatation. The gallbladder is
unremarkable.

Pancreas: Unremarkable. No pancreatic ductal dilatation or
surrounding inflammatory changes.

Spleen: Normal in size without focal abnormality.

Adrenals/Urinary Tract: The adrenal glands are unremarkable there is
no hydronephrosis or nephrolithiasis on either side. There is a
cm exophytic lesion from the inferior pole of the left kidney which
demonstrates fluid attenuation most consistent. Additional 15 mm
hypodense lesion from the upper pole of the right kidney, also
likely a cyst. The visualized ureters appear unremarkable. The
urinary bladder is collapsed.

Stomach/Bowel: There is severe sigmoid diverticulosis without active
inflammatory changes. Scattered colonic diverticula. There is no
bowel obstruction or active inflammation. There is a 3.7 cm proximal
duodenal diverticulum and a 2.5 cm distal duodenal diverticulum.
There is loose stool throughout the colon compatible with diarrheal
state. Correlation with clinical exam and stool cultures
recommended. The appendix is normal.

Vascular/Lymphatic: Moderate aortoiliac atherosclerotic disease. The
IVC is unremarkable. No portal venous gas. There is no adenopathy.

Reproductive: Enlarged prostate gland with median lobe hypertrophy
indenting the base of the bladder. The prostate gland measures
cm in transverse axial diameter. The seminal vesicles are symmetric.

Other: Ventral hernia repair mesh.

Musculoskeletal: Osteopenia with degenerative changes of the spine.
No acute osseous pathology.
IMPRESSION: 1. No hydronephrosis or nephrolithiasis.
2. Diarrheal state. Correlation with clinical exam and stool
cultures recommended. No bowel obstruction. Normal appendix.
3. Colonic diverticulosis.
4. Enlarged prostate gland with median lobe hypertrophy indenting
the base of the bladder.
5. Aortic Atherosclerosis (VTIEH-XR4.4).

## 2022-05-08 DIAGNOSIS — K5730 Diverticulosis of large intestine without perforation or abscess without bleeding: Secondary | ICD-10-CM | POA: Diagnosis not present

## 2022-05-08 DIAGNOSIS — S20219A Contusion of unspecified front wall of thorax, initial encounter: Secondary | ICD-10-CM | POA: Diagnosis not present

## 2022-05-10 ENCOUNTER — Encounter: Payer: Self-pay | Admitting: Internal Medicine

## 2022-05-18 ENCOUNTER — Ambulatory Visit (INDEPENDENT_AMBULATORY_CARE_PROVIDER_SITE_OTHER): Payer: Medicare Other | Admitting: Internal Medicine

## 2022-05-18 ENCOUNTER — Encounter: Payer: Self-pay | Admitting: Internal Medicine

## 2022-05-18 VITALS — BP 142/78 | HR 51 | Temp 97.6°F | Resp 16 | Ht 65.0 in | Wt 133.4 lb

## 2022-05-18 DIAGNOSIS — I10 Essential (primary) hypertension: Secondary | ICD-10-CM | POA: Diagnosis not present

## 2022-05-18 DIAGNOSIS — Z23 Encounter for immunization: Secondary | ICD-10-CM | POA: Diagnosis not present

## 2022-05-18 DIAGNOSIS — N1832 Chronic kidney disease, stage 3b: Secondary | ICD-10-CM | POA: Diagnosis not present

## 2022-05-18 DIAGNOSIS — D513 Other dietary vitamin B12 deficiency anemia: Secondary | ICD-10-CM | POA: Diagnosis not present

## 2022-05-18 DIAGNOSIS — D500 Iron deficiency anemia secondary to blood loss (chronic): Secondary | ICD-10-CM

## 2022-05-18 LAB — CBC WITH DIFFERENTIAL/PLATELET
Basophils Absolute: 0 10*3/uL (ref 0.0–0.1)
Basophils Relative: 0.2 % (ref 0.0–3.0)
Eosinophils Absolute: 0.2 10*3/uL (ref 0.0–0.7)
Eosinophils Relative: 2.1 % (ref 0.0–5.0)
HCT: 37.5 % — ABNORMAL LOW (ref 39.0–52.0)
Hemoglobin: 12.7 g/dL — ABNORMAL LOW (ref 13.0–17.0)
Lymphocytes Relative: 3.5 % — ABNORMAL LOW (ref 12.0–46.0)
Lymphs Abs: 0.3 10*3/uL — ABNORMAL LOW (ref 0.7–4.0)
MCHC: 33.9 g/dL (ref 30.0–36.0)
MCV: 94.1 fl (ref 78.0–100.0)
Monocytes Absolute: 0.6 10*3/uL (ref 0.1–1.0)
Monocytes Relative: 6.2 % (ref 3.0–12.0)
Neutro Abs: 8.1 10*3/uL — ABNORMAL HIGH (ref 1.4–7.7)
Neutrophils Relative %: 88 % — ABNORMAL HIGH (ref 43.0–77.0)
Platelets: 113 10*3/uL — ABNORMAL LOW (ref 150.0–400.0)
RBC: 3.99 Mil/uL — ABNORMAL LOW (ref 4.22–5.81)
RDW: 14.2 % (ref 11.5–15.5)
WBC: 9.2 10*3/uL (ref 4.0–10.5)

## 2022-05-18 LAB — BASIC METABOLIC PANEL
BUN: 19 mg/dL (ref 6–23)
CO2: 26 mEq/L (ref 19–32)
Calcium: 9.4 mg/dL (ref 8.4–10.5)
Chloride: 108 mEq/L (ref 96–112)
Creatinine, Ser: 1.75 mg/dL — ABNORMAL HIGH (ref 0.40–1.50)
GFR: 36.78 mL/min — ABNORMAL LOW (ref >60.00)
Glucose, Bld: 160 mg/dL — ABNORMAL HIGH (ref 70–99)
Potassium: 4.2 mEq/L (ref 3.5–5.1)
Sodium: 144 mEq/L (ref 135–145)

## 2022-05-18 LAB — VITAMIN B12: Vitamin B-12: 532 pg/mL (ref 211–911)

## 2022-05-18 LAB — FOLATE: Folate: >23.8 ng/mL (ref >5.9)

## 2022-05-18 LAB — IBC + FERRITIN
Ferritin: 996.9 ng/mL — ABNORMAL HIGH (ref 22.0–322.0)
Iron: 72 ug/dL (ref 42–165)
Saturation Ratios: 28.6 % (ref 20.0–50.0)
TIBC: 252 ug/dL (ref 250.0–450.0)
Transferrin: 180 mg/dL — ABNORMAL LOW (ref 212.0–360.0)
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
## 2022-05-18 NOTE — Patient Instructions (Signed)
Hypertension, Adult High blood pressure (hypertension) is when the force of blood pumping through the arteries is too strong. The arteries are the blood vessels that carry blood from the heart throughout the body. Hypertension forces the heart to work harder to pump blood and may cause arteries to become narrow or stiff. Untreated or uncontrolled hypertension can lead to a heart attack, heart failure, a stroke, kidney disease, and other problems. A blood pressure reading consists of a higher number over a lower number. Ideally, your blood pressure should be below 120/80. The first ("top") number is called the systolic pressure. It is a measure of the pressure in your arteries as your heart beats. The second ("bottom") number is called the diastolic pressure. It is a measure of the pressure in your arteries as the heart relaxes. What are the causes? The exact cause of this condition is not known. There are some conditions that result in high blood pressure. What increases the risk? Certain factors may make you more likely to develop high blood pressure. Some of these risk factors are under your control, including: Smoking. Not getting enough exercise or physical activity. Being overweight. Having too much fat, sugar, calories, or salt (sodium) in your diet. Drinking too much alcohol. Other risk factors include: Having a personal history of heart disease, diabetes, high cholesterol, or kidney disease. Stress. Having a family history of high blood pressure and high cholesterol. Having obstructive sleep apnea. Age. The risk increases with age. What are the signs or symptoms? High blood pressure may not cause symptoms. Very high blood pressure (hypertensive crisis) may cause: Headache. Fast or irregular heartbeats (palpitations). Shortness of breath. Nosebleed. Nausea and vomiting. Vision changes. Severe chest pain, dizziness, and seizures. How is this diagnosed? This condition is diagnosed by  measuring your blood pressure while you are seated, with your arm resting on a flat surface, your legs uncrossed, and your feet flat on the floor. The cuff of the blood pressure monitor will be placed directly against the skin of your upper arm at the level of your heart. Blood pressure should be measured at least twice using the same arm. Certain conditions can cause a difference in blood pressure between your right and left arms. If you have a high blood pressure reading during one visit or you have normal blood pressure with other risk factors, you may be asked to: Return on a different day to have your blood pressure checked again. Monitor your blood pressure at home for 1 week or longer. If you are diagnosed with hypertension, you may have other blood or imaging tests to help your health care provider understand your overall risk for other conditions. How is this treated? This condition is treated by making healthy lifestyle changes, such as eating healthy foods, exercising more, and reducing your alcohol intake. You may be referred for counseling on a healthy diet and physical activity. Your health care provider may prescribe medicine if lifestyle changes are not enough to get your blood pressure under control and if: Your systolic blood pressure is above 130. Your diastolic blood pressure is above 80. Your personal target blood pressure may vary depending on your medical conditions, your age, and other factors. Follow these instructions at home: Eating and drinking  Eat a diet that is high in fiber and potassium, and low in sodium, added sugar, and fat. An example of this eating plan is called the DASH diet. DASH stands for Dietary Approaches to Stop Hypertension. To eat this way: Eat   plenty of fresh fruits and vegetables. Try to fill one half of your plate at each meal with fruits and vegetables. Eat whole grains, such as whole-wheat pasta, brown rice, or whole-grain bread. Fill about one  fourth of your plate with whole grains. Eat or drink low-fat dairy products, such as skim milk or low-fat yogurt. Avoid fatty cuts of meat, processed or cured meats, and poultry with skin. Fill about one fourth of your plate with lean proteins, such as fish, chicken without skin, beans, eggs, or tofu. Avoid pre-made and processed foods. These tend to be higher in sodium, added sugar, and fat. Reduce your daily sodium intake. Many people with hypertension should eat less than 1,500 mg of sodium a day. Do not drink alcohol if: Your health care provider tells you not to drink. You are pregnant, may be pregnant, or are planning to become pregnant. If you drink alcohol: Limit how much you have to: 0-1 drink a day for women. 0-2 drinks a day for men. Know how much alcohol is in your drink. In the [place] one drink equals one 12 oz bottle of beer (355 mL), one 5 oz glass of wine (148 mL), or one 1 oz glass of hard liquor (44 mL). Lifestyle  Work with your health care provider to maintain a healthy body weight or to lose weight. Ask what an ideal weight is for you. Get at least 30 minutes of exercise that causes your heart to beat faster (aerobic exercise) most days of the week. Activities may include walking, swimming, or biking. Include exercise to strengthen your muscles (resistance exercise), such as Pilates or lifting weights, as part of your weekly exercise routine. Try to do these types of exercises for 30 minutes at least 3 days a week. Do not use any products that contain nicotine or tobacco. These products include cigarettes, chewing tobacco, and vaping devices, such as e-cigarettes. If you need help quitting, ask your health care provider. Monitor your blood pressure at home as told by your health care provider. Keep all follow-up visits. This is important. Medicines Take over-the-counter and prescription medicines only as told by your health care provider. Follow directions carefully. Blood  pressure medicines must be taken as prescribed. Do not skip doses of blood pressure medicine. Doing this puts you at risk for problems and can make the medicine less effective. Ask your health care provider about side effects or reactions to medicines that you should watch for. Contact a health care provider if you: Think you are having a reaction to a medicine you are taking. Have headaches that keep coming back (recurring). Feel dizzy. Have swelling in your ankles. Have trouble with your vision. Get help right away if you: Develop a severe headache or confusion. Have unusual weakness or numbness. Feel faint. Have severe pain in your chest or abdomen. Vomit repeatedly. Have trouble breathing. These symptoms may be an emergency. Get help right away. Call 911. Do not wait to see if the symptoms will go away. Do not drive yourself to the hospital. Summary Hypertension is when the force of blood pumping through your arteries is too strong. If this condition is not controlled, it may put you at risk for serious complications. Your personal target blood pressure may vary depending on your medical conditions, your age, and other factors. For most people, a normal blood pressure is less than 120/80. Hypertension is treated with lifestyle changes, medicines, or a combination of both. Lifestyle changes include losing weight, eating a healthy,   low-sodium diet, exercising more, and limiting alcohol. This information is not intended to replace advice given to you by your health care provider. Make sure you discuss any questions you have with your health care provider. Document Revised: 02/02/2021 Document Reviewed: 02/02/2021 Elsevier Patient Education  2023 Elsevier Inc.  

## 2022-05-19 IMAGING — CR DG ORBITS FOR FOREIGN BODY
2 series · 2 of 2 positions shown · non-contrast
Comparison: None.

CLINICAL DATA: Metal working/exposure; clearance prior to MRI

EXAM:
ORBITS FOR FOREIGN BODY - 2 VIEW

[w orbit pa (1 of 2)]
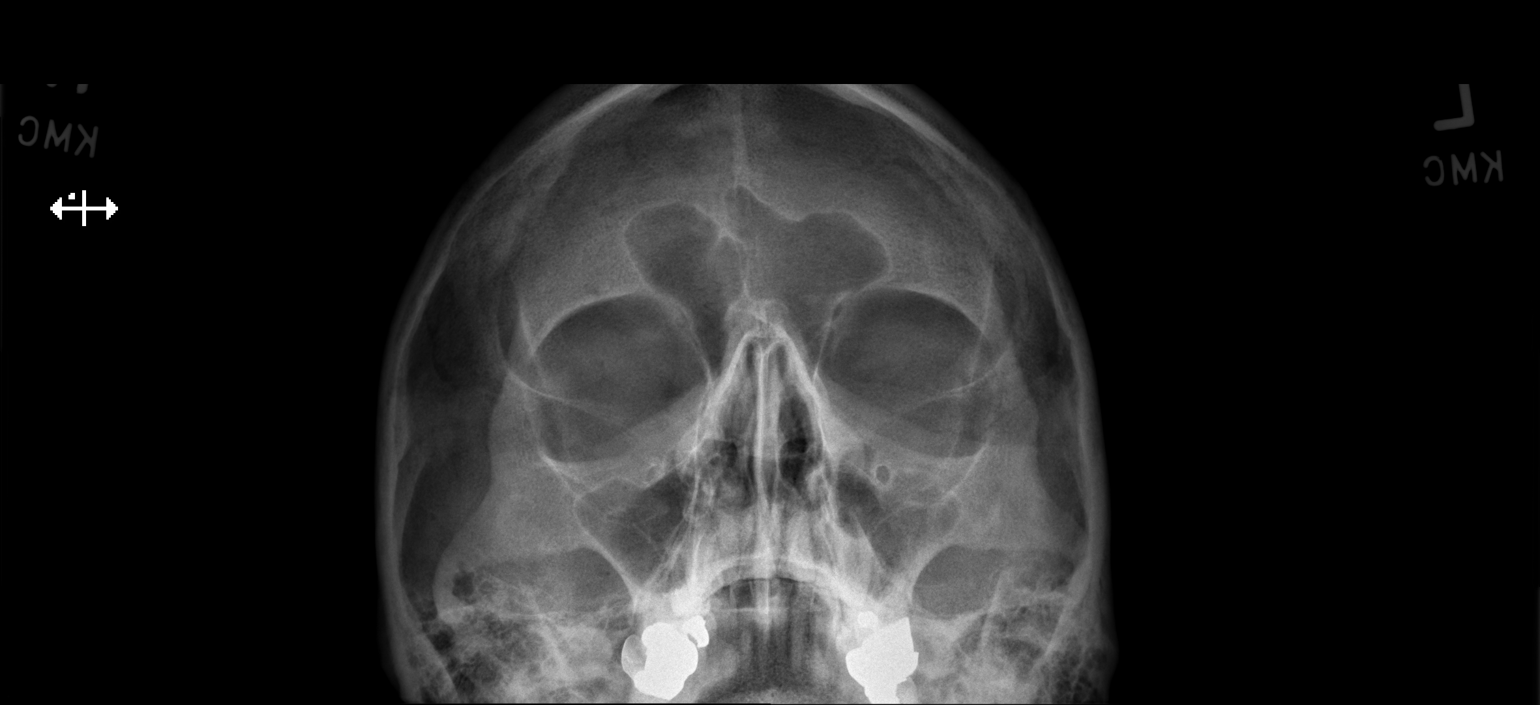

[w orbit pa (2 of 2)]
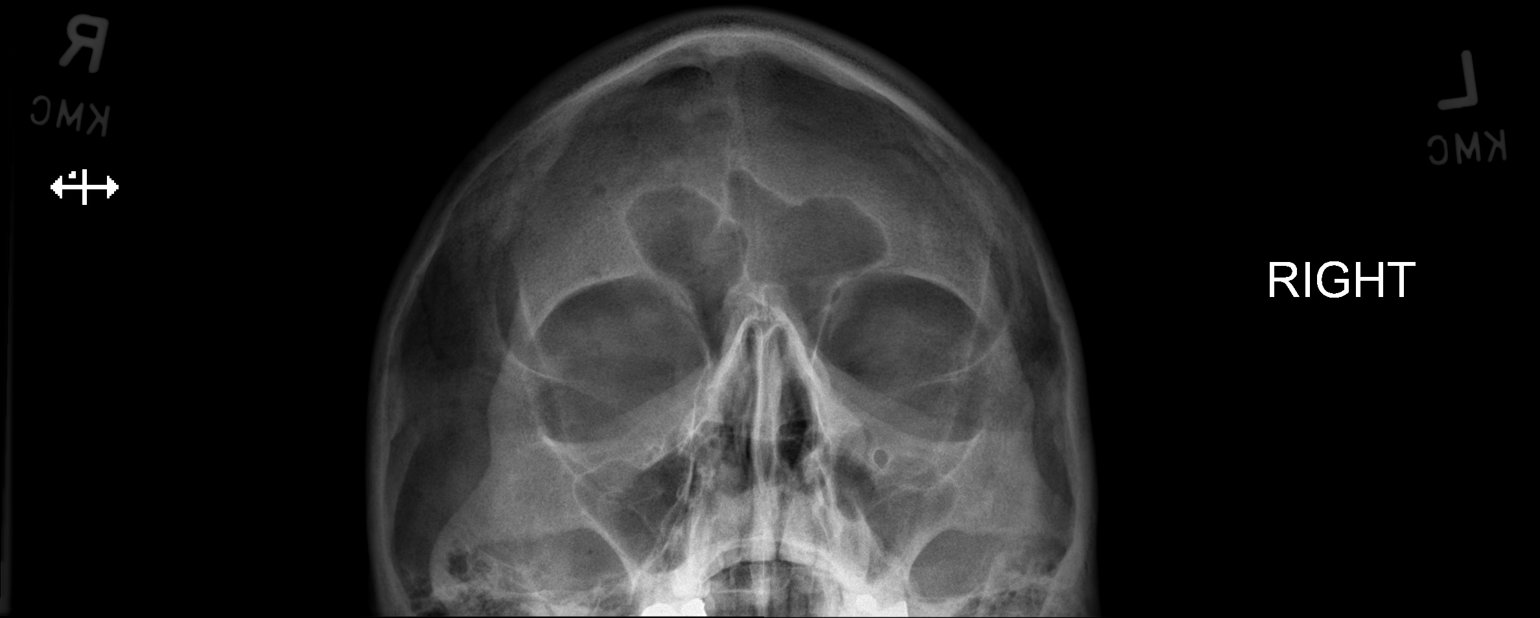

[2 of 2 positions shown; findings below may reference images not displayed]

FINDINGS: There is no evidence of metallic foreign body within the orbits. No
significant bone abnormality identified.
IMPRESSION: No evidence of metallic foreign body within the orbits.

## 2022-05-30 NOTE — Progress Notes (Unsigned)
PATIENT: Bruce Jordan. DOB: 1945-09-10  REASON FOR VISIT: follow up HISTORY FROM: patient PRIMARY NEUROLOGIST: Dr. Jaynee Eagles  No chief complaint on file.    HISTORY OF PRESENT ILLNESS: Today 05/30/22:  11/09/21: Bruce Jordan is a 77 year old male with a history of memory disturbance most consistent with alzheimer disease. He returns today for follow-up. Lives with his wife. Able to complete ADLs independently. Completes household chores- makes his bed. Wife manages meds and appointments. Denies any trouble sleeping. Drives to familiar places close to home.   04/29/21: Bruce Jordan is a 77 year old male with a history of memory disturbance most consistent with Alzheimer's disease.  He returns today with his wife.  He continues to live at home with her.  Reports that he is able to complete all ADLs independently.  He completes household chores.  She does help him with his medications and appointments.  Denies any trouble sleeping.  No change in mood or behavior.  He continues to operate a motor vehicle but reports that he only drives to places close to where he lives.  His wife notes that she has noticed no issues with his driving.  He returns today for an evaluation.  HISTORY (copied from Dr. Cathren Laine note)  Interval history 12/03/2020: At last appointment we found B12 133. I don't see an MRI completed. Last B12 normal 10/2020. Folate was low however at 4.7. bmp with slightly reduced gfr. Cbc unremarkable except plts 140 and lymphopenia. He did not follow up after last appointment. PCP has addressed the folate and asked him to supplement. Memory is getting worse. Repeating the formal memory testing. Dr. Alphonzo Severance. MRI brain. Sister recently diagnosed with dementia, they think Alzheimers. He is still very social, walks every day, no falls, no physical changes, no depression. See prior extensive History below.    HPI 06/21/2018:  Bruce Jordan. is a 77 y.o. male here as requested by Janith Lima, MD for memory loss. PMHx osteoarthritis, impaired fasting glucose, hypertension, asthma, bradycardia, depression, hyperlipidemia, coronary artery disease, chronic ischemic heart disease.  He is here with his wife who provides much information.He doesn't remember things he used to remember. Started 3 years and slowly progressive. He was working in [place] and when he came home he had a difficult time using the remote. It is more short-term memory. If someone tells him something he forgets, such as conversations, forgets appointments, he remembers birthdays, wife has always taken care of the bills for 33 years, he is still driving , no accidents or getting lost, he repeats questions and stories in the same day such as what time they are closing will ask 4-5 times in one day. His coworker has noticed. He doesn't know his family history and unclear if there is a history of dementia, he has hearing impairment. More short-term memory loss. He has an older sister without any memory loss. They have 1-2 glasses of alcohol a day. Still having difficulty with the remote but it is new, he still works with tools, he cooks, he makes lunch and brings it to his wife, he will make grilled cheese, he is more beligerant than he used to be, he works at [employer] and he will be more irritable and confrontational but not inappropriate behavior, no delusions or hallucinations. He feels hearing loss may be a factor as well. He is on an antidepressant and 3 years ago he had weight loss and wanted to sleep all the time  and he feels better he does not feel sad most of the time. Socially nothing has changed. He takes his own medications but on a holiday it confused him when his edication was not in the right spots and wife had to help.No significant history of smoking. He feels refreshed in the morning, no snoring. wife provides much information. No other focal neurologic deficits, associated symptoms, inciting events or modifiable  factors.   Reviewed notes, labs and imaging from outside physicians, which showed:   Reviewed Dr. Ronnald Ramp notes.  Patient and wife complaining about short-term memory and has been declining over the last few years.  Denied any other associated events REVIEW OF SYSTEMS: Out of a complete 14 system review of symptoms, the patient complains only of the following symptoms, and all other reviewed systems are negative.  ALLERGIES: No Known Allergies  HOME MEDICATIONS: Outpatient Medications Prior to Visit  Medication Sig Dispense Refill   albuterol (PROVENTIL HFA;VENTOLIN HFA) 108 (90 Base) MCG/ACT inhaler Inhale 1-2 puffs into the lungs every 6 (six) hours as needed for wheezing or shortness of breath. 18 g 3   Ascorbic Acid (VITAMIN C) 100 MG tablet Take 100 mg by mouth daily.     aspirin EC 81 MG tablet Take 81 mg by mouth daily.     atorvastatin (LIPITOR) 40 MG tablet Take 1 tablet (40 mg total) by mouth daily. 90 tablet 3   Cetirizine HCl (ZYRTEC PO) Take 10 mg by mouth daily.     cholecalciferol (VITAMIN D) 400 units TABS tablet Take 400 Units by mouth daily.     citalopram (CELEXA) 10 MG tablet Take 1 tablet by mouth once daily 90 tablet 0   clobetasol cream (TEMOVATE) [id number] % Apply 1 application topically 2 (two) times daily as needed (rash). 60 g 3   Cyanocobalamin (B-12) 1000 MCG CAPS      diclofenac sodium (VOLTAREN) 1 % GEL Apply 2 g topically 4 (four) times daily as needed (pain).      donepezil (ARICEPT) 10 MG tablet TAKE 1 TABLET BY MOUTH AT BEDTIME 90 tablet 1   EPINEPHrine (EPIPEN IJ) Inject as directed as directed.     ezetimibe (ZETIA) 10 MG tablet Take 1 tablet (10 mg total) by mouth daily. 90 tablet 3   ferrous sulfate 325 (65 FE) MG tablet Take 325 mg by mouth daily with breakfast.     folic acid (FOLVITE) 1 MG tablet TAKE 1 TABLET BY MOUTH EVERY OTHER DAY 45 tablet 0   lisinopril (ZESTRIL) 20 MG tablet Take 1 tablet (20 mg total) by mouth daily. 90 tablet 3   memantine  (NAMENDA) 10 MG tablet Take 1 tablet by mouth twice daily 180 tablet 0   Misc Natural Products (TART CHERRY ADVANCED PO) Take by mouth.     predniSONE (DELTASONE) 10 MG tablet Take 20 mg daily PO with breakfast x 3 days and then 10 mg daily PO with breakfast x 3 days 9 tablet 0   sildenafil (VIAGRA) 100 MG tablet TAKE 1 TABLET BY MOUTH ONCE DAILY AS NEEDED FOR ERECTILE DYSFUNCTION 5 tablet 3   TURMERIC PO Take by mouth.     No facility-administered medications prior to visit.    PAST MEDICAL HISTORY: Past Medical History:  Diagnosis Date   Benign prostatic hyperplasia without lower urinary tract symptoms 05/25/2016   Bradycardia, severe sinus 09/12/2017   Chronic ischemic heart disease    Chronic ITP (idiopathic thrombocytopenia) 05/16/2019   Coronary artery disease involving native coronary  artery of native heart without angina pectoris 05/25/2016   Dementia due to Alzheimer's disease, possible 12/15/2020   Dietary folate deficiency anemia 05/16/2019   Eczema 05/25/2016   Erectile dysfunction due to arterial insufficiency 99991111   Erosive oral lichen planus 0000000   Essential hypertension 05/25/2016   Estimated Creatinine Clearance: 39.4 mL/min (by C-G formula based on SCr of 1.41 mg/dL).   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   Vitamin B12 deficiency anemia 12/04/2018   Word finding difficulty 10/21/2020    PAST SURGICAL HISTORY: Past Surgical History:  Procedure Laterality Date   CARDIAC CATHETERIZATION       FAMILY HISTORY: Family History  Problem Relation Age of Onset   Hypertension Mother    Heart disease Mother    Heart disease Father    Cancer Father    Alzheimer's disease Sister    Dementia Sister    Early death Brother 51       MVA   Stroke Brother    Early death Brother 49       Epilepsy    SOCIAL HISTORY: Social History   Socioeconomic History   Marital status: Married    Spouse name: Not on file   Number of children: Not on file   Years of education: 12   Highest education level: High school graduate  Occupational History   Occupation: Retired    Comment: Mechanic/maintenance  Tobacco Use   Smoking status: Former   Smokeless tobacco: Never  Scientific laboratory technician Use: Never used  Substance and Sexual Activity   Alcohol use: Yes    Comment: a cocktail once in a while   Drug use: No   Sexual activity: Yes    Birth control/protection: None  Other Topics Concern   Not on file  Social History Narrative   Lives at home with wife    Right handed   Caffeine: 2 cups/day of coke    Social Determinants of Health   Financial Resource Strain: Low Risk  (10/22/2021)   Overall Financial Resource Strain (CARDIA)    Difficulty of Paying Living Expenses: Not hard at all  Food Insecurity: No Food Insecurity (10/22/2021)   Hunger Vital Sign    Worried About Running Out of Food in the Last Year: Never true    Ran Out of Food in the Last Year: Never true  Transportation Needs: No Transportation Needs (10/22/2021)   PRAPARE - Hydrologist (Medical): No    Lack of Transportation (Non-Medical): No  Physical Activity: Sufficiently Active (10/22/2021)   Exercise Vital Sign    Days of Exercise per Week: 5 days    Minutes of Exercise per Session: 30 min  Stress: No Stress Concern Present (10/22/2021)   [place]    Feeling of Stress : Not at all  Social Connections: [place] (10/22/2021)   Social Connection and Isolation Panel [NHANES]    Frequency of Communication with Friends and Family: More than  three times a week    Frequency of Social Gatherings with Friends and Family: Three times a week    Attends Religious Services: More than 4 times per year    Active Member of Clubs or Organizations: No    Attends Archivist Meetings: More than 4 times per year    Marital Status: Married  Human resources officer Violence: Not At Risk (10/22/2021)   Humiliation, Afraid, Rape, and Kick questionnaire    Fear of Current or Ex-Partner: No    Emotionally Abused: No    Physically Abused: No    Sexually Abused: No      PHYSICAL EXAM  There were no vitals filed for this visit.  There is no height or weight on file to calculate BMI.     11/09/2021    1:32 PM 10/22/2021    3:40 PM 04/29/2021    3:04 PM  MMSE - Mini Mental State Exam  Not completed:  Unable to complete   Orientation to time 2  1  Orientation to Place 1  4  Registration 3  3  Attention/ Calculation 0  1  Recall 2  0  Language- name 2 objects 2  2  Language- repeat 1  1  Language- follow 3 step command 3  3  Language- read & follow direction 1  1  Write a sentence 1  1  Copy design 0  1  Total score 16  18     Generalized: Well developed, in no acute distress   Neurological examination  Mentation: Alert oriented to time, place, history taking. Follows all commands speech and language fluent Cranial nerve II-XII: Pupils were equal round reactive to light. Extraocular movements were full, visual field were full on confrontational test. Facial sensation and strength were normal.  Head turning and shoulder shrug  were normal and symmetric. Motor: The motor testing reveals 5 over 5 strength of all 4 extremities. Good symmetric motor tone is noted throughout.  Sensory: Sensory testing is intact to soft touch on all 4 extremities. No evidence of extinction is noted.  Coordination:  Cerebellar testing reveals good finger-nose-finger and heel-to-shin bilaterally.  Gait and station: Gait is normal.    DIAGNOSTIC DATA (LABS, IMAGING, TESTING) - I reviewed patient records, labs, notes, testing and imaging myself where available.  Lab Results  Component Value Date   WBC 9.2 05/18/2022   HGB 12.7 (L) 05/18/2022   HCT 37.5 (L) 05/18/2022   MCV 94.1 05/18/2022   PLT 113.0 (L) 05/18/2022      Component Value Date/Time   NA 144 05/18/2022 1423   NA 144 01/14/2022 0955   K 4.2 05/18/2022 1423   CL 108 05/18/2022 1423   CO2 26 05/18/2022 1423   GLUCOSE 160 (H) 05/18/2022 1423   BUN 19 05/18/2022 1423   BUN 24 01/14/2022 0955   CREATININE 1.75 (H) 05/18/2022 1423   CREATININE 1.41 (H) 11/12/2019 0932   CALCIUM 9.4 05/18/2022 1423   PROT 6.6 04/26/2022 0951   ALBUMIN 4.5 04/26/2022 0951   AST 16 04/26/2022 0951   ALT 22 04/26/2022 0951   ALKPHOS 128 (H) 04/26/2022 0951   BILITOT 0.8 04/26/2022 0951   GFRNONAA 51 (L) 12/04/2020 1926   GFRNONAA 48 (L) 11/12/2019 0932   GFRAA 56 (L) 11/12/2019 0932   Lab Results  Component Value Date   CHOL 91 (L) 04/26/2022   HDL 46 04/26/2022   LDLCALC 29 04/26/2022   LDLDIRECT 77.0 10/04/2017   TRIG 78  04/26/2022   CHOLHDL 2.0 04/26/2022   Lab Results  Component Value Date   HGBA1C 5.4 11/12/2019   Lab Results  Component Value Date   [id number] 05/18/2022   Lab Results  Component Value Date   TSH 3.57 11/12/2019      ASSESSMENT AND PLAN 77 y.o. year old male  has a past medical history of Benign prostatic hyperplasia without lower urinary tract symptoms (05/25/2016), Bradycardia, severe sinus (09/12/2017), Chronic ischemic heart disease, Chronic ITP (idiopathic thrombocytopenia) (05/16/2019), Coronary artery disease involving native coronary artery of native heart without angina pectoris (05/25/2016), Dementia due to Alzheimer's disease, possible (12/15/2020), Dietary folate deficiency anemia (05/16/2019),  Eczema (05/25/2016), Erectile dysfunction due to arterial insufficiency (99991111), Erosive oral lichen planus (0000000), Essential hypertension (05/25/2016), Hyperlipidemia (05/25/2016), IFG (impaired fasting glucose), Iron deficiency anemia due to chronic blood loss (05/25/2016), Major depressive disorder (08/01/2014), Mild intermittent asthma with acute exacerbation (09/12/2017), Osteoarthritis, Prepatellar bursitis, right knee (02/24/2020), Primary osteoarthritis of both shoulders (05/25/2016), S/P CABG x 1 (05/14/2013), Stage 3b chronic kidney disease (05/16/2019), Type 2 diabetes mellitus with diabetic nephropathy (09/09/2014), Vitamin B12 deficiency anemia (12/04/2018), and Word finding difficulty (10/21/2020). here with:  1.  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  Advised wife she can call and we will send in 10 mg tablet Discussed driving-recommended that driving be restricted.  Patient and wife verbalized understanding. Follow-up in 6 months or sooner if needed     Ward Givens, MSN, NP-C 05/30/2022, 5:01 PM [facility] [address], [place], [place] [address] [phone]

## 2022-05-31 ENCOUNTER — Encounter: Payer: Self-pay | Admitting: Adult Health

## 2022-05-31 ENCOUNTER — Ambulatory Visit (INDEPENDENT_AMBULATORY_CARE_PROVIDER_SITE_OTHER): Payer: Medicare Other | Admitting: Adult Health

## 2022-05-31 VITALS — BP 123/52 | HR 54 | Ht 65.0 in | Wt 131.2 lb

## 2022-05-31 DIAGNOSIS — G309 Alzheimer's disease, unspecified: Secondary | ICD-10-CM

## 2022-05-31 DIAGNOSIS — F0280 Dementia in other diseases classified elsewhere without behavioral disturbance: Secondary | ICD-10-CM

## 2022-05-31 MED ORDER — DONEPEZIL HCL 10 MG PO TABS: 10.0000 mg | ORAL_TABLET | Freq: Every day | ORAL | 1 refills | Status: DC

## 2022-05-31 MED ORDER — MEMANTINE HCL 10 MG PO TABS: 10.0000 mg | ORAL_TABLET | Freq: Two times a day (BID) | ORAL | 3 refills | Status: AC

## 2022-06-16 ENCOUNTER — Other Ambulatory Visit: Payer: Self-pay | Admitting: Internal Medicine

## 2022-06-16 DIAGNOSIS — F418 Other specified anxiety disorders: Secondary | ICD-10-CM

## 2022-07-14 NOTE — Progress Notes (Signed)
Cardiology Office Note    Date:  07/14/2022   ID:  Bruce Faries., DOB 01/25/1946, MRN [id number]  PCP:  Etta Grandchild, MD  Cardiologist:  Nayah Lukens [place], MD    History of Present Illness:  Bruce Causby. is a 77 y.o. male seen for follow up CAD. He has a history of CAD s/p CABG in 2008 in [place] for failed PCI. This was a LIMA to the LAD. In 2013 he had PCI of a tortuous RCA. In April 2016 he had a low risk Cardiolite without evidence of ischemia. He has a history of HTN and dyslipidemia. 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  He moved to [place] to be closer to family. He lives in  an Apartment with his wife.  He reports he is doing well.  He denies any chest pain, palpitations, dizziness. No SOB.  Is doing a lot of chores around the house and yard. He has dementia and is now on Aricept and Namenda.  He now reports he is planning on moving to [facility] this summer to be close to family.    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 18 g 3   Ascorbic Acid (VITAMIN C) 100 MG tablet Take 100 mg by mouth daily.     aspirin EC 81 MG tablet Take 81 mg by mouth daily.     atorvastatin (LIPITOR) 40 MG tablet Take 1 tablet (40 mg total) by mouth daily. 90 tablet 3   Cetirizine HCl (ZYRTEC PO) Take 10 mg by mouth daily.     cholecalciferol (VITAMIN D) 400 units TABS tablet Take 400 Units by mouth daily.     citalopram (CELEXA) 10 MG tablet Take 1 tablet by mouth once daily 90 tablet 0   clobetasol cream (TEMOVATE) 0.05 % Apply 1 application topically 2 (two) times daily as needed (rash). 60 g 3  Cyanocobalamin (B-12) 1000 MCG CAPS      diclofenac sodium (VOLTAREN) 1 % GEL Apply 2 g topically 4 (four) times daily as needed (pain).      donepezil (ARICEPT) 10 MG tablet Take 1 tablet (10 mg total) by mouth at bedtime. 90 tablet 1   EPINEPHrine (EPIPEN IJ) Inject as directed as directed.     ezetimibe (ZETIA) 10 MG tablet Take 1 tablet (10 mg total) by mouth daily. 90 tablet 3   ferrous sulfate 325 (65 FE) MG tablet Take 325 mg by mouth daily with breakfast.     folic acid (FOLVITE) 1 MG tablet TAKE 1 TABLET BY MOUTH EVERY OTHER DAY 45 tablet 0   lisinopril (ZESTRIL) 20 MG tablet Take 1 tablet (20 mg total) by mouth daily. 90 tablet 3    memantine (NAMENDA) 10 MG tablet Take 1 tablet (10 mg total) by mouth 2 (two) times daily. 180 tablet 3   Misc Natural Products (TART CHERRY ADVANCED PO) Take by mouth.     predniSONE (DELTASONE) 10 MG tablet Take 20 mg daily PO with breakfast x 3 days and then 10 mg daily PO with breakfast x 3 days 9 tablet 0   sildenafil (VIAGRA) 100 MG tablet TAKE 1 TABLET BY MOUTH ONCE DAILY AS NEEDED FOR ERECTILE DYSFUNCTION 5 tablet 3   TURMERIC PO Take by mouth.     No facility-administered medications prior to visit.     Allergies:   Patient has no known allergies.   Social History   Socioeconomic History   Marital status: Married    Spouse name: Not on file   Number of children: Not on file   Years of education: 12   Highest education level: High school graduate  Occupational History   Occupation: Retired    Comment: Mechanic/maintenance  Tobacco Use   Smoking status: Former   Smokeless tobacco: Never  Building services engineer Use: Never used  Substance and Sexual Activity   Alcohol use: Yes    Comment: a cocktail once in a while   Drug use: No   Sexual activity: Yes    Birth control/protection: None  Other Topics Concern   Not on file  Social History Narrative   Lives at home with wife    Right handed   Caffeine: 2 cups/day of coke    Social Determinants of Health   Financial Resource Strain: Low Risk  (10/22/2021)   Overall Financial Resource Strain (CARDIA)    Difficulty of Paying Living Expenses: Not hard at all  Food Insecurity: No Food Insecurity (10/22/2021)   Hunger Vital Sign    Worried About Running Out of Food in the Last Year: Never true    Ran Out of Food in the Last Year: Never true  Transportation Needs: No Transportation Needs (10/22/2021)   PRAPARE - Administrator, Civil Service (Medical): No    Lack of Transportation (Non-Medical): No  Physical Activity: Sufficiently Active (10/22/2021)   Exercise Vital Sign    Days of Exercise per Week: 5 days     Minutes of Exercise per Session: 30 min  Stress: No Stress Concern Present (10/22/2021)   [employer] of Occupational Health - Occupational Stress Questionnaire    Feeling of Stress : Not at all  Social Connections: Socially Integrated (10/22/2021)   Social Connection and Isolation Panel [NHANES]    Frequency of Communication with Friends and Family: More than three times a week    Frequency of  Social Gatherings with Friends and Family: Three times a week    Attends Religious Services: More than 4 times per year    Active Member of Clubs or Organizations: No    Attends Engineer, structural: More than 4 times per year    Marital Status: Married     Family History:  The patient's family history includes Alzheimer's disease in his sister; Cancer in his father; Dementia in his sister; Early death (age of onset: 19) in his brother; Early death (age of onset: 27) in his brother; Heart disease in his father and mother; Hypertension in his mother; Stroke in his brother.   ROS:   Please see the history of present illness.    ROS All other systems reviewed and are negative.   PHYSICAL EXAM:   VS:  There were no vitals taken for this visit.   GENERAL:  Well appearing WM in NAD HEENT:  PERRL, EOMI, sclera are clear. Oropharynx is clear. NECK:  No jugular venous distention, carotid upstroke brisk and symmetric, no bruits, no thyromegaly or adenopathy LUNGS:  Clear to auscultation bilaterally CHEST:  Unremarkable HEART:  RRR with extrasystoles,  PMI not displaced or sustained,S1 and S2 within normal limits, no S3, no S4: no clicks, no rubs, no murmurs ABD:  Soft, nontender. BS +, no masses or bruits. No hepatomegaly, no splenomegaly EXT:  2 + pulses throughout, no edema, no cyanosis no clubbing SKIN:  Warm and dry.  No rashes NEURO:  Alert and oriented x 3. Cranial nerves II through XII intact. PSYCH:  Cognitively intact   Wt Readings from Last 3 Encounters:  05/31/22 131 lb  3.2 oz (59.5 kg)  05/18/22 133 lb 6.4 oz (60.5 kg)  04/26/22 129 lb (58.5 kg)      Studies/Labs Reviewed:   EKG:  EKG is  ordered today.  Sinus brady with rate 51,  LAD. Otherwise normal. I have personally reviewed and interpreted this study.    Recent Labs: 04/26/2022: ALT 22 05/18/2022: BUN 19; Creatinine, Ser 1.75; Hemoglobin 12.7; Platelets 113.0; Potassium 4.2; Sodium 144   Lipid Panel    Component Value Date/Time   CHOL 91 (L) 04/26/2022 0951   TRIG 78 04/26/2022 0951   HDL 46 04/26/2022 0951   CHOLHDL 2.0 04/26/2022 0951   CHOLHDL 3.2 11/12/2019 0932   VLDL 35.8 12/04/2018 1534   LDLCALC 29 04/26/2022 0951   LDLCALC 81 11/12/2019 0932   LDLDIRECT 77.0 10/04/2017 1607    Additional studies/ records that were reviewed today include:  Labs dated 08/24/15: glucose 177, creatinine 1.47. Otherwise CMET normal. Platelets 118K, otherwise CBC is normal. A1c 5.9%. TSH normal. Cholesterol 170, triglycerides 119, LDL 85, HDL 61. LDL particle number 995. Dated 05/26/16: cholesterol 144, triglycerides 98, HDL 62, LDL 62. A1c 5.7%. Creatinine 1.27. Hgb, TSH, ALT normal. Dated 10/04/17: cholesterol 162, triglycerides 240, HDL 70, LDL 77. A1c 5.7%. Creatinine 1.41. ALT normal. CBC normal.   ASSESSMENT:   CAD s/p CABG in 2008. Last Myoview in 2016 looked good. He is asymptomatic. Continue medical therapy with ASA, statin. HTN well controlled continue  lisinopril Hypercholesterolemia. On high dose statin. Labs excellent in January with LDL 29.  CKD stage 3. Last creatinine 1.75 Dementia - followed by Neurology.  Follow up PRN. He is going to get established with cardiologist in [facility]    Medication Adjustments/Labs and Tests Ordered: Current medicines are reviewed at length with the patient today.  Concerns regarding medicines are outlined above.  Medication changes,  Labs and Tests ordered today are listed in the Patient Instructions below. There are no Patient Instructions on file for this  visit.   Signed, Rennie Hack [place], MD  07/14/2022 1:00 PM    [facility] [address] [place], [place], [address] [phone]

## 2022-07-19 ENCOUNTER — Encounter: Payer: Self-pay | Admitting: Cardiology

## 2022-07-19 ENCOUNTER — Encounter: Payer: Self-pay | Admitting: Internal Medicine

## 2022-07-19 ENCOUNTER — Other Ambulatory Visit: Payer: Self-pay | Admitting: Internal Medicine

## 2022-07-19 ENCOUNTER — Ambulatory Visit: Payer: Medicare Other | Attending: Cardiology | Admitting: Cardiology

## 2022-07-19 VITALS — BP 132/68 | HR 51 | Ht 65.0 in | Wt 130.2 lb

## 2022-07-19 DIAGNOSIS — H1013 Acute atopic conjunctivitis, bilateral: Secondary | ICD-10-CM | POA: Insufficient documentation

## 2022-07-19 DIAGNOSIS — E785 Hyperlipidemia, unspecified: Secondary | ICD-10-CM | POA: Diagnosis not present

## 2022-07-19 DIAGNOSIS — I2510 Atherosclerotic heart disease of native coronary artery without angina pectoris: Secondary | ICD-10-CM | POA: Diagnosis not present

## 2022-07-19 DIAGNOSIS — J301 Allergic rhinitis due to pollen: Secondary | ICD-10-CM

## 2022-07-19 DIAGNOSIS — I10 Essential (primary) hypertension: Secondary | ICD-10-CM | POA: Diagnosis not present

## 2022-07-19 MED ORDER — BEPOTASTINE BESILATE 1.5 % OP SOLN
1.0000 [drp] | Freq: Two times a day (BID) | OPHTHALMIC | 1 refills | Status: AC
Start: 2022-07-19 — End: ?

## 2022-07-19 MED ORDER — MONTELUKAST SODIUM 10 MG PO TABS
10.0000 mg | ORAL_TABLET | Freq: Every day | ORAL | 1 refills | Status: DC
Start: 2022-07-19 — End: 2023-01-19

## 2022-07-19 NOTE — Patient Instructions (Signed)
Medication Instructions:  Continue same medications   Lab Work: None ordered   Testing/Procedures: None ordered   Follow-Up: At [facility], you and your health needs are our priority.  As part of our continuing mission to provide you with exceptional heart care, we have created designated Provider Care Teams.  These Care Teams include your primary Cardiologist (physician) and Advanced Practice Providers (APPs -  Physician Assistants and Nurse Practitioners) who all work together to provide you with the care you need, when you need it.  We recommend signing up for the patient portal called "MyChart".  Sign up information is provided on this After Visit Summary.  MyChart is used to connect with patients for Virtual Visits (Telemedicine).  Patients are able to view lab/test results, encounter notes, upcoming appointments, etc.  Non-urgent messages can be sent to your provider as well.   To learn more about what you can do with MyChart, go to [email or web address].    Your next appointment:  As Needed   Good Luck with your move to [facility] !    Provider:  Dr.Jordan

## 2022-07-25 ENCOUNTER — Other Ambulatory Visit: Payer: Self-pay | Admitting: Internal Medicine

## 2022-07-25 DIAGNOSIS — D520 Dietary folate deficiency anemia: Secondary | ICD-10-CM

## 2022-09-19 ENCOUNTER — Ambulatory Visit (INDEPENDENT_AMBULATORY_CARE_PROVIDER_SITE_OTHER): Payer: Medicare Other | Admitting: Internal Medicine

## 2022-09-19 ENCOUNTER — Encounter: Payer: Self-pay | Admitting: Internal Medicine

## 2022-09-19 VITALS — BP 138/68 | HR 61 | Temp 98.1°F | Resp 16 | Ht 65.0 in | Wt 130.0 lb

## 2022-09-19 DIAGNOSIS — I10 Essential (primary) hypertension: Secondary | ICD-10-CM

## 2022-09-19 DIAGNOSIS — D513 Other dietary vitamin B12 deficiency anemia: Secondary | ICD-10-CM | POA: Diagnosis not present

## 2022-09-19 DIAGNOSIS — D693 Immune thrombocytopenic purpura: Secondary | ICD-10-CM | POA: Diagnosis not present

## 2022-09-19 DIAGNOSIS — N1832 Chronic kidney disease, stage 3b: Secondary | ICD-10-CM | POA: Diagnosis not present

## 2022-09-19 DIAGNOSIS — R739 Hyperglycemia, unspecified: Secondary | ICD-10-CM

## 2022-09-19 DIAGNOSIS — D520 Dietary folate deficiency anemia: Secondary | ICD-10-CM | POA: Diagnosis not present

## 2022-09-19 DIAGNOSIS — D500 Iron deficiency anemia secondary to blood loss (chronic): Secondary | ICD-10-CM | POA: Diagnosis not present

## 2022-09-19 DIAGNOSIS — D539 Nutritional anemia, unspecified: Secondary | ICD-10-CM | POA: Diagnosis not present

## 2022-09-19 NOTE — Patient Instructions (Signed)

## 2022-09-19 NOTE — Progress Notes (Unsigned)
Subjective:  Patient ID: Bruce Crumrine., male    DOB: [birth year]  Age: 77 y.o. MRN: [id number]  CC: No chief complaint on file.   HPI Bruce Mo. presents for ***  Outpatient Medications Prior to Visit  Medication Sig Dispense Refill   albuterol (PROVENTIL HFA;VENTOLIN HFA) 108 (90 Base) MCG/ACT inhaler Inhale 1-2 puffs into the lungs every 6 (six) hours as needed for wheezing or shortness of breath. 18 g 3   Ascorbic Acid (VITAMIN C) 100 MG tablet Take 100 mg by mouth daily.     aspirin EC 81 MG tablet Take 81 mg by mouth daily.     atorvastatin (LIPITOR) 40 MG tablet Take 1 tablet (40 mg total) by mouth daily. 90 tablet 3   Bepotastine Besilate (BEPREVE) 1.5 % SOLN Place 1 drop into both eyes 2 (two) times daily. 10 mL 1   Cetirizine HCl (ZYRTEC PO) Take 10 mg by mouth daily.     cholecalciferol (VITAMIN D) 400 units TABS tablet Take 400 Units by mouth daily.     citalopram (CELEXA) 10 MG tablet Take 1 tablet by mouth once daily 90 tablet 0   clobetasol cream (TEMOVATE) 0.05 % Apply 1 application topically 2 (two) times daily as needed (rash). 60 g 3   Cyanocobalamin (B-12) 1000 MCG CAPS      diclofenac sodium (VOLTAREN) 1 % GEL Apply 2 g topically 4 (four) times daily as needed (pain).      donepezil (ARICEPT) 10 MG tablet Take 1 tablet (10 mg total) by mouth at bedtime. 90 tablet 1   EPINEPHrine (EPIPEN IJ) Inject as directed as directed.     ezetimibe (ZETIA) 10 MG tablet Take 1 tablet (10 mg total) by mouth daily. 90 tablet 3   ferrous sulfate 325 (65 FE) MG tablet Take 325 mg by mouth daily with breakfast.     folic acid (FOLVITE) 1 MG tablet TAKE 1 TABLET BY MOUTH EVERY OTHER DAY 45 tablet 0   lisinopril (ZESTRIL) 20 MG tablet Take 1 tablet (20 mg total) by mouth daily. 90 tablet 3   memantine (NAMENDA) 10 MG tablet Take 1 tablet (10 mg total) by mouth 2 (two) times daily. 180 tablet 3   Misc Natural Products (TART CHERRY ADVANCED PO) Take by mouth.      montelukast (SINGULAIR) 10 MG tablet Take 1 tablet (10 mg total) by mouth at bedtime. 90 tablet 1   Multiple Vitamins-Minerals (PRESERVISION AREDS 2+MULTI VIT PO) Take 1 tablet by mouth in the morning and at bedtime.     sildenafil (VIAGRA) 100 MG tablet TAKE 1 TABLET BY MOUTH ONCE DAILY AS NEEDED FOR ERECTILE DYSFUNCTION 5 tablet 3   TURMERIC PO Take by mouth.     No facility-administered medications prior to visit.    ROS Review of Systems  Objective:  BP 138/68 (BP Location: Left Arm, Patient Position: Sitting, Cuff Size: Normal)   Pulse 61   Temp 98.1 F (36.7 C) (Oral)   Resp 16   Ht 5\' 5"  (1.651 m)   Wt 130 lb (59 kg)   SpO2 96%   BMI 21.63 kg/m   BP Readings from Last 3 Encounters:  09/19/22 138/68  07/19/22 132/68  05/31/22 (!) 123/52    Wt Readings from Last 3 Encounters:  09/19/22 130 lb (59 kg)  07/19/22 130 lb 3.2 oz (59.1 kg)  05/31/22 131 lb 3.2 oz (59.5 kg)    Physical Exam  Lab Results  Component  Value Date   WBC 9.2 05/18/2022   HGB 12.7 (L) 05/18/2022   HCT 37.5 (L) 05/18/2022   PLT 113.0 (L) 05/18/2022   GLUCOSE 160 (H) 05/18/2022   CHOL 91 (L) 04/26/2022   TRIG 78 04/26/2022   HDL 46 04/26/2022   LDLDIRECT 77.0 10/04/2017   LDLCALC 29 04/26/2022   ALT 22 04/26/2022   AST 16 04/26/2022   NA 144 05/18/2022   K 4.2 05/18/2022   CL 108 05/18/2022   CREATININE 1.75 (H) 05/18/2022   BUN 19 05/18/2022   CO2 26 05/18/2022   TSH 3.57 11/12/2019   PSA 3.49 12/04/2018   INR 1.0 05/31/2021   HGBA1C 5.4 11/12/2019    CT HEAD WO CONTRAST ( )  Result Date: 04/28/2022 CLINICAL DATA:  New onset headache for 1 month.  History of dementia EXAM: CT HEAD WITHOUT CONTRAST TECHNIQUE: Contiguous axial images were obtained from the base of the skull through the vertex without intravenous contrast. RADIATION DOSE REDUCTION: This exam was performed according to the departmental dose-optimization program which includes automated exposure control, adjustment  of the mA and/or kV according to patient size and/or use of iterative reconstruction technique. COMPARISON:  Brain MRI 12/16/2020 FINDINGS: Brain: No evidence of acute infarction, hemorrhage, hydrocephalus, extra-axial collection or mass lesion/mass effect. Generalized cerebral volume loss. Vascular: No hyperdense vessel or unexpected calcification. Skull: Normal. Negative for fracture or focal lesion. Sinuses/Orbits: No acute finding.  Clear sinuses. IMPRESSION: No acute finding or explanation for headaches, stable appearance of the brain since 2022. Electronically Signed   By: Tiburcio Pea M.D.   On: 04/28/2022 10:13    Assessment & Plan:  Chronic ITP (idiopathic thrombocytopenia)  Dietary folate deficiency anemia  Iron deficiency anemia due to chronic blood loss  Stage 3b chronic kidney disease (HCC)  Vitamin B12 deficiency anemia     Follow-up: No follow-ups on file.  Sanda Linger, MD

## 2022-09-29 ENCOUNTER — Telehealth: Payer: Self-pay | Admitting: Internal Medicine

## 2022-09-29 DIAGNOSIS — Z0279 Encounter for issue of other medical certificate: Secondary | ICD-10-CM

## 2022-09-29 NOTE — Telephone Encounter (Signed)
Patient said he spoke with Dr. Yetta Barre about issues with his shoulders. He said he forgot to mention he had issues with his upper arms as well. He would like a call back at [phone].   He also dropped off an envelope for Dr. Yetta Barre that has been placed in his box.

## 2022-10-03 ENCOUNTER — Other Ambulatory Visit: Payer: Self-pay | Admitting: Internal Medicine

## 2022-10-03 DIAGNOSIS — F418 Other specified anxiety disorders: Secondary | ICD-10-CM

## 2022-10-24 ENCOUNTER — Other Ambulatory Visit: Payer: Self-pay | Admitting: Internal Medicine

## 2022-10-24 DIAGNOSIS — D520 Dietary folate deficiency anemia: Secondary | ICD-10-CM

## 2022-11-30 ENCOUNTER — Ambulatory Visit: Payer: Medicare Other | Admitting: Adult Health

## 2022-11-30 ENCOUNTER — Encounter: Payer: Self-pay | Admitting: Adult Health

## 2022-11-30 VITALS — BP 115/54 | HR 53 | Ht 65.0 in | Wt 126.0 lb

## 2022-11-30 DIAGNOSIS — F0280 Dementia in other diseases classified elsewhere without behavioral disturbance: Secondary | ICD-10-CM

## 2022-11-30 DIAGNOSIS — G309 Alzheimer's disease, unspecified: Secondary | ICD-10-CM | POA: Diagnosis not present

## 2022-11-30 NOTE — Patient Instructions (Signed)
Your Plan:  Continue Aricept and namenda If your symptoms worsen or you develop new symptoms please let us know.   Thank you for coming to see [place] at [facility]. I hope we have been able to provide you high quality care today.  You may receive a patient satisfaction survey over the next few weeks. We would appreciate your feedback and comments so that we may continue to improve ourselves and the health of our patients.

## 2022-11-30 NOTE — Progress Notes (Signed)
PATIENT: Bruce Jordan. DOB: 1945/11/30  REASON FOR VISIT: follow up HISTORY FROM: patient PRIMARY NEUROLOGIST: Dr. Lucia Gaskins  Chief Complaint  Patient presents with   RM 4    Patient is here with his wife for 6 month memory follow-up. pt states things have been fine. Patient states he hasn't fallen. Pt's wife states he sleeps a lot. They feel it may be due to boredom. MMSE 10/30 Animals 8.     HISTORY OF PRESENT ILLNESS: Today 11/30/22:  Bruce Jordan. is a 77 y.o. male with a history of Alzheimer's disease. Returns today for follow-up.  Overall he feels that he has remained stable.  Wife reports that he sleeps a lot. Patient reports that he doesn't have any friends so he sleeps because he is bored.  Able to complete all ADLs independently.  His wife continues to manage medications appointments and finances.No change in mood or behavior. Remains on Aricept and Namenda.    2/20/24[name]. is a 77 y.o. male with a history of alzheimer's disease. Returns today for follow-up.  Feels that his memory is the same. Wife feels that some days or better than others. Able to complete complete ADLS independently. wife Manages medications, appointments and finances. Sleeping ok. Denies any change in mood or behavior. Operates a motor vehicle without difficulty. Currently on Aricept and Namenda.  Patient's wife reports that they will be moving to [facility] with her daughter.  They plan to move August.   11/09/21: [name] is a 77 year old male with a history of memory disturbance most consistent with alzheimer disease. He returns today for follow-up. Lives with his wife. Able to complete ADLs independently. Completes household chores- makes his bed. Wife manages meds and appointments. Denies any trouble sleeping. Drives to familiar places close to home.   04/29/21: [name] is a 77 year old male with a history of memory disturbance most consistent with Alzheimer's disease.  He  returns today with his wife.  He continues to live at home with her.  Reports that he is able to complete all ADLs independently.  He completes household chores.  She does help him with his medications and appointments.  Denies any trouble sleeping.  No change in mood or behavior.  He continues to operate a motor vehicle but reports that he only drives to places close to where he lives.  His wife notes that she has noticed no issues with his driving.  He returns today for an evaluation.  HISTORY (copied from Dr. Trevor Mace note)  Interval history 12/03/2020: At last appointment we found B12 133. I don't see an MRI completed. Last B12 normal 10/2020. Folate was low however at 4.7. bmp with slightly reduced gfr. Cbc unremarkable except plts 140 and lymphopenia. He did not follow up after last appointment. PCP has addressed the folate and asked him to supplement. Memory is getting worse. Repeating the formal memory testing. Dr. Clayborn Heron. MRI brain. Sister recently diagnosed with dementia, they think Alzheimers. He is still very social, walks every day, no falls, no physical changes, no depression. See prior extensive History below.    HPI 06/21/2018:  Bruce Jordan. is a 77 y.o. male here as requested by Etta Grandchild, MD for memory loss. PMHx osteoarthritis, impaired fasting glucose, hypertension, asthma, bradycardia, depression, hyperlipidemia, coronary artery disease, chronic ischemic heart disease.  He is here with his wife who provides much information.He doesn't remember things he used to remember. Started  3 years and slowly progressive. He was working in [place] and when he came home he had a difficult time using the remote. It is more short-term memory. If someone tells him something he forgets, such as conversations, forgets appointments, he remembers birthdays, wife has always taken care of the bills for 33 years, he is still driving , no accidents or getting lost, he repeats questions and stories in  the same day such as what time they are closing will ask 4-5 times in one day. His coworker has noticed. He doesn't know his family history and unclear if there is a history of dementia, he has hearing impairment. More short-term memory loss. He has an older sister without any memory loss. They have 1-2 glasses of alcohol a day. Still having difficulty with the remote but it is new, he still works with tools, he cooks, he makes lunch and brings it to his wife, he will make grilled cheese, he is more beligerant than he used to be, he works at [employer] and he will be more irritable and confrontational but not inappropriate behavior, no delusions or hallucinations. He feels hearing loss may be a factor as well. He is on an antidepressant and 3 years ago he had weight loss and wanted to sleep all the time and he feels better he does not feel sad most of the time. Socially nothing has changed. He takes his own medications but on a holiday it confused him when his edication was not in the right spots and wife had to help.No significant history of smoking. He feels refreshed in the morning, no snoring. wife provides much information. No other focal neurologic deficits, associated symptoms, inciting events or modifiable factors.   Reviewed notes, labs and imaging from outside physicians, which showed:   Reviewed Dr. Yetta Barre notes.  Patient and wife complaining about short-term memory and has been declining over the last few years.  Denied any other associated events REVIEW OF SYSTEMS: Out of a complete 14 system review of symptoms, the patient complains only of the following symptoms, and all other reviewed systems are negative.  ALLERGIES: Not on File  HOME MEDICATIONS: Outpatient Medications Prior to Visit  Medication Sig Dispense Refill   albuterol (PROVENTIL HFA;VENTOLIN HFA) 108 (90 Base) MCG/ACT inhaler Inhale 1-2 puffs into the lungs every 6 (six) hours as needed for wheezing or shortness of breath. 18  g 3   Ascorbic Acid (VITAMIN C) 100 MG tablet Take 100 mg by mouth daily.     aspirin EC 81 MG tablet Take 81 mg by mouth daily.     atorvastatin (LIPITOR) 40 MG tablet Take 1 tablet (40 mg total) by mouth daily. 90 tablet 3   Bepotastine Besilate (BEPREVE) 1.5 % SOLN Place 1 drop into both eyes 2 (two) times daily. 10 mL 1   Cetirizine HCl (ZYRTEC PO) Take 10 mg by mouth daily.     cholecalciferol (VITAMIN D) 400 units TABS tablet Take 400 Units by mouth daily.     citalopram (CELEXA) 10 MG tablet Take 1 tablet by mouth once daily 90 tablet 1   clobetasol cream (TEMOVATE) 0.05 % Apply 1 application topically 2 (two) times daily as needed (rash). 60 g 3   Cyanocobalamin (B-12) 1000 MCG CAPS      diclofenac sodium (VOLTAREN) 1 % GEL Apply 2 g topically 4 (four) times daily as needed (pain).      donepezil (ARICEPT) 10 MG tablet Take 1 tablet (10 mg total) by  mouth at bedtime. 90 tablet 1   EPINEPHrine (EPIPEN IJ) Inject as directed as directed.     ezetimibe (ZETIA) 10 MG tablet Take 1 tablet (10 mg total) by mouth daily. 90 tablet 3   ferrous sulfate 325 (65 FE) MG tablet Take 325 mg by mouth daily with breakfast.     folic acid (FOLVITE) 1 MG tablet TAKE 1 TABLET BY MOUTH EVERY OTHER DAY 45 tablet 0   lisinopril (ZESTRIL) 20 MG tablet Take 1 tablet (20 mg total) by mouth daily. 90 tablet 3   memantine (NAMENDA) 10 MG tablet Take 1 tablet (10 mg total) by mouth 2 (two) times daily. 180 tablet 3   Misc Natural Products (TART CHERRY ADVANCED PO) Take by mouth.     Multiple Vitamins-Minerals (PRESERVISION AREDS 2+MULTI VIT PO) Take 1 tablet by mouth in the morning and at bedtime.     sildenafil (VIAGRA) 100 MG tablet TAKE 1 TABLET BY MOUTH ONCE DAILY AS NEEDED FOR ERECTILE DYSFUNCTION 5 tablet 3   TURMERIC PO Take by mouth.     montelukast (SINGULAIR) 10 MG tablet Take 1 tablet (10 mg total) by mouth at bedtime. 90 tablet 1   No facility-administered medications prior to visit.    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   Vitamin B12 deficiency anemia 12/04/2018   Word finding difficulty 10/21/2020    PAST SURGICAL HISTORY: Past Surgical History:  Procedure Laterality Date   CARDIAC CATHETERIZATION      FAMILY HISTORY: Family History  Problem Relation Age of Onset   Hypertension Mother    Heart disease Mother    Heart disease Father    Cancer Father    Alzheimer's disease Sister    Dementia Sister    Early death Brother 45       MVA   Stroke Brother    Early death Brother 9       Epilepsy    SOCIAL HISTORY: Social History   Socioeconomic History   Marital status: Married    Spouse name: Not on file   Number of children: Not on file   Years of education: 12    Highest education level: High school graduate  Occupational History   Occupation: Retired    Comment: Mechanic/maintenance  Tobacco Use   Smoking status: Former   Smokeless tobacco: Never  Advertising account planner  Vaping status: Never Used  Substance and Sexual Activity   Alcohol use: Yes    Comment: a cocktail once in a while   Drug use: No   Sexual activity: Yes    Birth control/protection: None  Other Topics Concern   Not on file  Social History Narrative   Lives at home with wife    Right handed   Caffeine: 2 cups/day of coke    Social Determinants of Health   Financial Resource Strain: Low Risk  (10/22/2021)   Overall Financial Resource Strain (CARDIA)    Difficulty of Paying Living Expenses: Not hard at all  Food Insecurity: No Food Insecurity (10/22/2021)   Hunger Vital Sign    Worried About Running Out of Food in the Last Year: Never true    Ran Out of Food in the Last Year: Never true  Transportation Needs: No Transportation Needs (10/22/2021)   PRAPARE - Administrator, Civil Service (Medical): No    Lack of Transportation (Non-Medical): No  Physical Activity: Sufficiently Active (10/22/2021)   Exercise Vital Sign    Days of Exercise per Week: 5 days    Minutes of Exercise per Session: 30 min  Stress: No Stress Concern Present (10/22/2021)   [employer] of Occupational Health - Occupational Stress Questionnaire    Feeling of Stress : Not at all  Social Connections: Socially Integrated (10/22/2021)   Social Connection and Isolation Panel [NHANES]    Frequency of Communication with Friends and Family: More than three times a week    Frequency of Social Gatherings with Friends and Family: Three times a week    Attends Religious Services: More than 4 times per year    Active Member of Clubs or Organizations: No    Attends Banker Meetings: More than 4 times per year    Marital Status: Married  Catering manager Violence: Not At Risk (10/22/2021)    Humiliation, Afraid, Rape, and Kick questionnaire    Fear of Current or Ex-Partner: No    Emotionally Abused: No    Physically Abused: No    Sexually Abused: No      PHYSICAL EXAM  Vitals:   11/30/22 1453  BP: (!) 115/54  Pulse: (!) 53  Weight: 126 lb (57.2 kg)  Height: 5\' 5"  (1.651 m)    Body mass index is 20.97 kg/m.     11/30/2022    3:01 PM 05/31/2022    2:28 PM 11/09/2021    1:32 PM  MMSE - Mini Mental State Exam  Orientation to time 0 1 2  Orientation to Place 1 2 1   Registration 3 3 3   Attention/ Calculation 0 0 0  Recall 0 3 2  Language- name 2 objects 2 2 2   Language- repeat 0 1 1  Language- follow 3 step command 3 3 3   Language- read & follow direction 0 1 1  Write a sentence 1 0 1  Copy design 0 0 0  Total score 10 16 16      Generalized: Well developed, in no acute distress   Neurological examination  Mentation: Alert oriented to time, place, history taking. Follows all commands speech and language fluent Cranial nerve II-XII: Pupils were equal round reactive to light. Extraocular movements were full, visual field were full on confrontational test. Facial sensation and strength were normal.  Head turning and shoulder shrug  were normal and symmetric. Motor: The motor testing reveals 5 over 5 strength of all 4 extremities. Good  symmetric motor Bruce is noted throughout.  Sensory: Sensory testing is intact to soft touch on all 4 extremities. No evidence of extinction is noted.  Coordination: Cerebellar testing reveals good finger-nose-finger and heel-to-shin bilaterally.  Gait and station: Gait is normal.    DIAGNOSTIC DATA (LABS, IMAGING, TESTING) - I reviewed patient records, labs, notes, testing and imaging myself where available.  Lab Results  Component Value Date   WBC 9.2 05/18/2022   HGB 12.7 (L) 05/18/2022   HCT 37.5 (L) 05/18/2022   MCV 94.1 05/18/2022   PLT 113.0 (L) 05/18/2022      Component Value Date/Time   NA 144 05/18/2022 1423    NA 144 01/14/2022 0955   K 4.2 05/18/2022 1423   CL 108 05/18/2022 1423   CO2 26 05/18/2022 1423   GLUCOSE 160 (H) 05/18/2022 1423   BUN 19 05/18/2022 1423   BUN 24 01/14/2022 0955   CREATININE 1.75 (H) 05/18/2022 1423   CREATININE 1.41 (H) 11/12/2019 0932   CALCIUM 9.4 05/18/2022 1423   PROT 6.6 04/26/2022 0951   ALBUMIN 4.5 04/26/2022 0951   AST 16 04/26/2022 0951   ALT 22 04/26/2022 0951   ALKPHOS 128 (H) 04/26/2022 0951   BILITOT 0.8 04/26/2022 0951   GFRNONAA 51 (L) 12/04/2020 1926   GFRNONAA 48 (L) 11/12/2019 0932   GFRAA 56 (L) 11/12/2019 0932   Lab Results  Component Value Date   CHOL 91 (L) 04/26/2022   HDL 46 04/26/2022   LDLCALC 29 04/26/2022   LDLDIRECT 77.0 10/04/2017   TRIG 78 04/26/2022   CHOLHDL 2.0 04/26/2022   Lab Results  Component Value Date   HGBA1C 5.4 11/12/2019   Lab Results  Component Value Date   VITAMINB12 532 05/18/2022   Lab Results  Component Value Date   TSH 3.57 11/12/2019      ASSESSMENT AND PLAN 77 y.o. year old male  has a past medical history of Benign prostatic hyperplasia without lower urinary tract symptoms (05/25/2016), Bradycardia, severe sinus (09/12/2017), Chronic ischemic heart disease, Chronic ITP (idiopathic thrombocytopenia) (05/16/2019), Coronary artery disease involving native coronary artery of native heart without angina pectoris (05/25/2016), Dementia due to Alzheimer's disease, possible (12/15/2020), Dietary folate deficiency anemia (05/16/2019), Eczema (05/25/2016), Erectile dysfunction due to arterial insufficiency (08/09/2016), Erosive oral lichen planus (05/25/2016), Essential hypertension (05/25/2016), Hyperlipidemia (05/25/2016), IFG (impaired fasting glucose), Iron deficiency anemia due to chronic blood loss (05/25/2016), Major depressive disorder (08/01/2014), Mild intermittent asthma with acute exacerbation (09/12/2017), Osteoarthritis, Prepatellar bursitis, right knee (02/24/2020), Primary osteoarthritis of both  shoulders (05/25/2016), S/P CABG x 1 (05/14/2013), Stage 3b chronic kidney disease (05/16/2019), Type 2 diabetes mellitus with diabetic nephropathy (09/09/2014), Vitamin B12 deficiency anemia (12/04/2018), and Word finding difficulty (10/21/2020). here with:  1.  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

## 2023-01-12 ENCOUNTER — Other Ambulatory Visit: Payer: Self-pay | Admitting: Internal Medicine

## 2023-01-12 DIAGNOSIS — D520 Dietary folate deficiency anemia: Secondary | ICD-10-CM

## 2023-01-13 ENCOUNTER — Emergency Department (HOSPITAL_BASED_OUTPATIENT_CLINIC_OR_DEPARTMENT_OTHER)
Admission: EM | Admit: 2023-01-13 | Discharge: 2023-01-13 | Disposition: A | Discharge status: Home / Self Care | Payer: Medicare Other | Attending: Emergency Medicine | Admitting: Emergency Medicine

## 2023-01-13 ENCOUNTER — Other Ambulatory Visit: Payer: Self-pay

## 2023-01-13 ENCOUNTER — Emergency Department (HOSPITAL_BASED_OUTPATIENT_CLINIC_OR_DEPARTMENT_OTHER): Payer: Medicare Other

## 2023-01-13 DIAGNOSIS — Z79899 Other long term (current) drug therapy: Secondary | ICD-10-CM | POA: Insufficient documentation

## 2023-01-13 DIAGNOSIS — M25532 Pain in left wrist: Secondary | ICD-10-CM | POA: Diagnosis not present

## 2023-01-13 DIAGNOSIS — M19032 Primary osteoarthritis, left wrist: Secondary | ICD-10-CM | POA: Diagnosis not present

## 2023-01-13 DIAGNOSIS — I2510 Atherosclerotic heart disease of native coronary artery without angina pectoris: Secondary | ICD-10-CM | POA: Insufficient documentation

## 2023-01-13 DIAGNOSIS — Z7982 Long term (current) use of aspirin: Secondary | ICD-10-CM | POA: Insufficient documentation

## 2023-01-13 DIAGNOSIS — N189 Chronic kidney disease, unspecified: Secondary | ICD-10-CM | POA: Diagnosis not present

## 2023-01-13 DIAGNOSIS — I129 Hypertensive chronic kidney disease with stage 1 through stage 4 chronic kidney disease, or unspecified chronic kidney disease: Secondary | ICD-10-CM | POA: Insufficient documentation

## 2023-01-13 MED ORDER — DICLOFENAC SODIUM 1 % TD GEL: 2.0000 g | Freq: Four times a day (QID) | TRANSDERMAL | 0 refills | Status: AC | PRN

## 2023-01-13 NOTE — ED Notes (Signed)
Portable Xray at bedside.

## 2023-01-13 NOTE — ED Provider Notes (Signed)
[place] EMERGENCY DEPARTMENT AT [facility] Provider Note   CSN: [id number] Arrival date & time: 01/13/23  1054     History  Chief Complaint  Patient presents with   Wrist Pain    L    Bruce Jordan. is a 77 y.o. male.  Patient with history of CAD, hypertension, hyperlipidemia, CKD presents today with complaints of left wrist pain. He states that same has been ongoing for 2 days. Denies any known trauma or injury. Denies any history of similar symptoms previously. He does note a history of gout, but states that he has only ever had gout flares in his great toe and his last flare was many years ago. He is not currently on any gout prevention medications. Denies any fevers or chills. No other joint pain. No swelling to the wrist area.  The history is provided by the patient. No language interpreter was used.  Wrist Pain       Home Medications Prior to Admission medications   Medication Sig Start Date End Date Taking? Authorizing Provider  albuterol (PROVENTIL HFA;VENTOLIN HFA) 108 (90 Base) MCG/ACT inhaler Inhale 1-2 puffs into the lungs every 6 (six) hours as needed for wheezing or shortness of breath. 09/12/17   Etta Grandchild, MD  Ascorbic Acid (VITAMIN C) 100 MG tablet Take 100 mg by mouth daily.    [provider]  aspirin EC 81 MG tablet Take 81 mg by mouth daily.    [provider]  atorvastatin (LIPITOR) 40 MG tablet Take 1 tablet (40 mg total) by mouth daily. 01/14/22   [place], Peter M, MD  Bepotastine Besilate (BEPREVE) 1.5 % SOLN Place 1 drop into both eyes 2 (two) times daily. 07/19/22   Etta Grandchild, MD  Cetirizine HCl (ZYRTEC PO) Take 10 mg by mouth daily.    [provider]  cholecalciferol (VITAMIN D) 400 units TABS tablet Take 400 Units by mouth daily.    [provider]  citalopram (CELEXA) 10 MG tablet Take 1 tablet by mouth once daily 10/03/22   Etta Grandchild, MD  clobetasol cream (TEMOVATE) 0.05 % Apply 1  application topically 2 (two) times daily as needed (rash). 03/09/20   Etta Grandchild, MD  Cyanocobalamin (B-12) 1000 MCG CAPS  04/11/17   [provider]  diclofenac sodium (VOLTAREN) 1 % GEL Apply 2 g topically 4 (four) times daily as needed (pain).     [provider]  donepezil (ARICEPT) 10 MG tablet Take 1 tablet (10 mg total) by mouth at bedtime. 05/31/22   Butch Penny, NP  EPINEPHrine (EPIPEN IJ) Inject as directed as directed.    [provider]  ezetimibe (ZETIA) 10 MG tablet Take 1 tablet (10 mg total) by mouth daily. 01/18/22 01/13/23  [place], Peter M, MD  ferrous sulfate 325 (65 FE) MG tablet Take 325 mg by mouth daily with breakfast.    [provider]  folic acid (FOLVITE) 1 MG tablet TAKE 1 TABLET BY MOUTH EVERY OTHER DAY 01/12/23   Etta Grandchild, MD  lisinopril (ZESTRIL) 20 MG tablet Take 1 tablet (20 mg total) by mouth daily. 01/14/22   [place], Peter M, MD  memantine (NAMENDA) 10 MG tablet Take 1 tablet (10 mg total) by mouth 2 (two) times daily. 05/31/22   Butch Penny, NP  Misc Natural Products (TART CHERRY ADVANCED PO) Take by mouth.    [provider]  montelukast (SINGULAIR) 10 MG tablet Take 1 tablet (10 mg  total) by mouth at bedtime. 07/19/22   Etta Grandchild, MD  Multiple Vitamins-Minerals (PRESERVISION AREDS 2+MULTI VIT PO) Take 1 tablet by mouth in the morning and at bedtime. 01/10/19   [provider]  sildenafil (VIAGRA) 100 MG tablet TAKE 1 TABLET BY MOUTH ONCE DAILY AS NEEDED FOR ERECTILE DYSFUNCTION 10/18/21   Etta Grandchild, MD  TURMERIC PO Take by mouth.    [provider]      Allergies    Patient has no allergy information on record.    Review of Systems   Review of Systems  Musculoskeletal:  Positive for arthralgias.  All other systems reviewed and are negative.   Physical Exam Updated Vital Signs BP (!) 146/60   Pulse (!) 52   Temp (!) 97.5 F (36.4 C)   Resp 16   SpO2 100%   Physical Exam Vitals and nursing note reviewed.  Constitutional:      General: He is not in acute distress.    Appearance: Normal appearance. He is normal weight. He is not ill-appearing, toxic-appearing or diaphoretic.  HENT:     Head: Normocephalic and atraumatic.  Cardiovascular:     Rate and Rhythm: Normal rate.  Pulmonary:     Effort: Pulmonary effort is normal. No respiratory distress.  Musculoskeletal:        General: Normal range of motion.     Cervical back: Normal range of motion.     Comments: TTP over the posterior left wrist area without erythema, warmth, fluctuance, induration or swelling. ROM grossly intact, mildly limited due to pain to both flexion and extension of the wrist. ROM to the left hand and fingers fully intact. Capillary refill less than 2 seconds. Radial and ulnar pulses intact and 2+. Distal sensation intact. No snuffbox tenderness. Negative Finkelstein's test  Skin:    General: Skin is warm and dry.  Neurological:     General: No focal deficit present.     Mental Status: He is alert.  Psychiatric:        Mood and Affect: Mood normal.        Behavior: Behavior normal.     ED Results / Procedures / Treatments   Labs (all labs ordered are listed, but only abnormal results are displayed) Labs Reviewed - No data to display  EKG None  Radiology DG Wrist Complete Left  Result Date: 01/13/2023 CLINICAL DATA:  left wrist pain.  No known injury. EXAM: LEFT WRIST - COMPLETE 3+ VIEW COMPARISON:  None Available. FINDINGS: Examination is limited due to flexed fingers. No acute fracture or dislocation. No aggressive osseous lesion. Mild diffuse degenerative changes of imaged joints with asymmetric moderate-to-severe involvement of first carpometacarpal joint. Chondrocalcinosis of triangular fibrocartilage complex noted. No radiopaque foreign bodies. Vascular calcifications seen. Soft tissues are within normal limits. IMPRESSION: 1. Limited exam. 2. Degenerative  changes. No acute fracture or dislocation. Electronically Signed   By: Jules Schick M.D.   On: 01/13/2023 13:58    Procedures Procedures    Medications Ordered in ED Medications - No data to display  ED Course/ Medical Decision Making/ A&P                                 Medical Decision Making Amount and/or Complexity of Data Reviewed Radiology: ordered.   Patient presents today with complaints of atraumatic left wrist pain x 2 days.  He is afebrile, nontoxic-appearing, and in no  acute distress with reassuring vital signs.  Physical exam per above reveals TTP over the left posterior wrist without swelling, erythema, or warmth. ROM grossly intact with some limitations due to pain.  Negative Finkelstein's test and Tinel's test.  Good pulses and sensation and capillary refill.  Patient overall well appearing. X-ray imaging obtained which has resulted and reveals  1. Limited exam. 2. Degenerative changes. No acute fracture or dislocation.  I have personally reviewed and interpreted this imaging and agree with radiology interpretation.   Given patients benign exam, low suspicion for septic arthritis or gout. Suspect symptoms due to inflammatory arthritis vs chronic degenerative changes. Patient given wrist brace to wear with symptomatic improvement. Recommend RICE and tylenol with voltaren cream as well. Given referral to orthopedics for follow-up. Evaluation and diagnostic testing in the emergency department does not suggest an emergent condition requiring admission or immediate intervention beyond what has been performed at this time.  Plan for discharge with close PCP follow-up.  Patient is understanding and amenable with plan, educated on red flag symptoms that would prompt immediate return.  Patient discharged in stable condition.  Final Clinical Impression(s) / ED Diagnoses Final diagnoses:  Left wrist pain    Rx / DC Orders ED Discharge Orders          Ordered    diclofenac  sodium (VOLTAREN) 1 % GEL  4 times daily PRN        01/13/23 1444          An After Visit Summary was printed and given to the patient.     Vear Clock 01/13/23 1444    Linwood Dibbles, MD 01/16/23 1726

## 2023-01-13 NOTE — ED Notes (Signed)
PA at bedside.

## 2023-01-13 NOTE — ED Triage Notes (Signed)
Pt c/o L wrist pain onset yesterday. No known injury/ trauma, +ROM to joint, + cap refill  Hx gout

## 2023-01-13 NOTE — Discharge Instructions (Addendum)
As discussed, your workup in the ER today was reassuring for acute findings.  X-ray imaging of your wrist did not show any fracture or dislocation.  I have given you a brace to wear for support, you may remove this to shower.  Please follow-up with your orthopedist at your earliest convenience for continued evaluation and management of this.  If you do not have close follow-up with an orthopedist, I have given you a referral with a number to call.  Return if development of any new or worsening symptoms.

## 2023-01-13 NOTE — ED Notes (Signed)
Pt given discharge instructions. Opportunities given for questions. Pt verbalizes understanding. Wrist brace applied to L wrist.  Jillyn Hidden, RN

## 2023-01-19 ENCOUNTER — Encounter: Payer: Self-pay | Admitting: Internal Medicine

## 2023-01-19 ENCOUNTER — Ambulatory Visit: Payer: Medicare Other | Admitting: Internal Medicine

## 2023-01-19 VITALS — BP 122/68 | HR 63 | Temp 98.1°F | Resp 16 | Ht 65.0 in | Wt 122.0 lb

## 2023-01-19 DIAGNOSIS — M10032 Idiopathic gout, left wrist: Secondary | ICD-10-CM | POA: Insufficient documentation

## 2023-01-19 DIAGNOSIS — Z23 Encounter for immunization: Secondary | ICD-10-CM

## 2023-01-19 DIAGNOSIS — M25432 Effusion, left wrist: Secondary | ICD-10-CM | POA: Insufficient documentation

## 2023-01-19 DIAGNOSIS — D500 Iron deficiency anemia secondary to blood loss (chronic): Secondary | ICD-10-CM

## 2023-01-19 DIAGNOSIS — M25532 Pain in left wrist: Secondary | ICD-10-CM

## 2023-01-19 DIAGNOSIS — R739 Hyperglycemia, unspecified: Secondary | ICD-10-CM | POA: Diagnosis not present

## 2023-01-19 DIAGNOSIS — I10 Essential (primary) hypertension: Secondary | ICD-10-CM | POA: Diagnosis not present

## 2023-01-19 DIAGNOSIS — N184 Chronic kidney disease, stage 4 (severe): Secondary | ICD-10-CM | POA: Diagnosis not present

## 2023-01-19 LAB — CBC WITH DIFFERENTIAL/PLATELET
Basophils Absolute: 0 10*3/uL (ref 0.0–0.1)
Basophils Relative: 0.6 % (ref 0.0–3.0)
Eosinophils Absolute: 0.2 10*3/uL (ref 0.0–0.7)
Eosinophils Relative: 5 % (ref 0.0–5.0)
HCT: 38.4 % — ABNORMAL LOW (ref 39.0–52.0)
Hemoglobin: 12.6 g/dL — ABNORMAL LOW (ref 13.0–17.0)
Lymphocytes Relative: 7.4 % — ABNORMAL LOW (ref 12.0–46.0)
Lymphs Abs: 0.3 10*3/uL — ABNORMAL LOW (ref 0.7–4.0)
MCHC: 32.8 g/dL (ref 30.0–36.0)
MCV: 96.2 fL (ref 78.0–100.0)
Monocytes Absolute: 0.6 10*3/uL (ref 0.1–1.0)
Monocytes Relative: 13 % — ABNORMAL HIGH (ref 3.0–12.0)
Neutro Abs: 3.4 10*3/uL (ref 1.4–7.7)
Neutrophils Relative %: 74 % (ref 43.0–77.0)
Platelets: 150 10*3/uL (ref 150.0–400.0)
RBC: 3.99 Mil/uL — ABNORMAL LOW (ref 4.22–5.81)
RDW: 14.4 % (ref 11.5–15.5)
WBC: 4.6 10*3/uL (ref 4.0–10.5)

## 2023-01-19 LAB — IBC + FERRITIN
Ferritin: 1248.1 ng/mL — ABNORMAL HIGH (ref 22.0–322.0)
Iron: 71 ug/dL (ref 42–165)
Saturation Ratios: 32.1 % (ref 20.0–50.0)
TIBC: 221.2 ug/dL — ABNORMAL LOW (ref 250.0–450.0)
Transferrin: 158 mg/dL — ABNORMAL LOW (ref 212.0–360.0)

## 2023-01-19 LAB — BASIC METABOLIC PANEL
BUN: 22 mg/dL (ref 6–23)
CO2: 24 meq/L (ref 19–32)
Calcium: 9.3 mg/dL (ref 8.4–10.5)
Chloride: 110 meq/L (ref 96–112)
Creatinine, Ser: 1.72 mg/dL — ABNORMAL HIGH (ref 0.40–1.50)
GFR: 37.37 mL/min — ABNORMAL LOW (ref >60.00)
Glucose, Bld: 142 mg/dL — ABNORMAL HIGH (ref 70–99)
Potassium: 3.5 meq/L (ref 3.5–5.1)
Sodium: 145 meq/L (ref 135–145)

## 2023-01-19 LAB — URIC ACID: Uric Acid, Serum: 6.7 mg/dL (ref 4.0–7.8)

## 2023-01-19 LAB — HEMOGLOBIN A1C: Hgb A1c MFr Bld: 5.6 % (ref 4.6–6.5)

## 2023-01-19 LAB — C-REACTIVE PROTEIN: CRP: 1.3 mg/dL (ref 0.5–20.0)

## 2023-01-19 MED ORDER — COLCHICINE 0.6 MG PO TABS
0.6000 mg | ORAL_TABLET | Freq: Every day | ORAL | 0 refills | Status: DC
Start: 2023-01-19 — End: 2023-02-03

## 2023-01-19 MED ORDER — METHYLPREDNISOLONE ACETATE 80 MG/ML IJ SUSP
80.0000 mg | Freq: Once | INTRAMUSCULAR | Status: AC
Start: 2023-01-19 — End: 2023-01-19
  Administered 2023-01-19: 80 mg via INTRAMUSCULAR

## 2023-01-19 NOTE — Progress Notes (Signed)
Subjective:  Patient ID: Bruce Doupe., male    DOB: 1946-02-09  Age: 77 y.o. MRN: [id number]  CC: Anemia and Osteoarthritis   HPI Bruce Dickes. presents for f/up ---  Discussed the use of AI scribe software for clinical note transcription with the patient, who gave verbal consent to proceed.  History of Present Illness   The patient presents with bilateral shoulder and wrist pain that started recently. He denies any known injury or trauma to these areas. The left wrist is notably swollen and red, with symptoms persisting for a week. Despite the discomfort, the patient has not been taking any regular pain medication, occasionally resorting to Tylenol. The patient had previously visited the emergency room due to the left wrist's condition, where an X-ray was performed, revealing no fractures. The patient was given a brace for the wrist, which he has not been using due to discomfort.  The patient denies any systemic symptoms such as fever, chills, night sweats, chest pain, or shortness of breath. He has been maintaining his usual activities, including yard work.       Outpatient Medications Prior to Visit  Medication Sig Dispense Refill   albuterol (PROVENTIL HFA;VENTOLIN HFA) 108 (90 Base) MCG/ACT inhaler Inhale 1-2 puffs into the lungs every 6 (six) hours as needed for wheezing or shortness of breath. 18 g 3   Ascorbic Acid (VITAMIN C) 100 MG tablet Take 100 mg by mouth daily.     aspirin EC 81 MG tablet Take 81 mg by mouth daily.     atorvastatin (LIPITOR) 40 MG tablet Take 1 tablet (40 mg total) by mouth daily. 90 tablet 3   Bepotastine Besilate (BEPREVE) 1.5 % SOLN Place 1 drop into both eyes 2 (two) times daily. 10 mL 1   Cetirizine HCl (ZYRTEC PO) Take 10 mg by mouth daily.     cholecalciferol (VITAMIN D) 400 units TABS tablet Take 400 Units by mouth daily.     citalopram (CELEXA) 10 MG tablet Take 1 tablet by mouth once daily 90 tablet 1   clobetasol cream (TEMOVATE)  0.05 % Apply 1 application topically 2 (two) times daily as needed (rash). 60 g 3   Cyanocobalamin (B-12) 1000 MCG CAPS      diclofenac sodium (VOLTAREN) 1 % GEL Apply 2 g topically 4 (four) times daily as needed (pain). 150 g 0   donepezil (ARICEPT) 10 MG tablet Take 1 tablet (10 mg total) by mouth at bedtime. 90 tablet 1   EPINEPHrine (EPIPEN IJ) Inject as directed as directed.     ferrous sulfate 325 (65 FE) MG tablet Take 325 mg by mouth daily with breakfast.     folic acid (FOLVITE) 1 MG tablet TAKE 1 TABLET BY MOUTH EVERY OTHER DAY 45 tablet 0   lisinopril (ZESTRIL) 20 MG tablet Take 1 tablet (20 mg total) by mouth daily. 90 tablet 3   memantine (NAMENDA) 10 MG tablet Take 1 tablet (10 mg total) by mouth 2 (two) times daily. 180 tablet 3   Misc Natural Products (TART CHERRY ADVANCED PO) Take by mouth.     Multiple Vitamins-Minerals (PRESERVISION AREDS 2+MULTI VIT PO) Take 1 tablet by mouth in the morning and at bedtime.     sildenafil (VIAGRA) 100 MG tablet TAKE 1 TABLET BY MOUTH ONCE DAILY AS NEEDED FOR ERECTILE DYSFUNCTION 5 tablet 3   TURMERIC PO Take by mouth.     ezetimibe (ZETIA) 10 MG tablet Take 1 tablet (10 mg total)  by mouth daily. 90 tablet 3   montelukast (SINGULAIR) 10 MG tablet Take 1 tablet (10 mg total) by mouth at bedtime. 90 tablet 1   No facility-administered medications prior to visit.    ROS Review of Systems  Constitutional: Negative.  Negative for diaphoresis and fatigue.  HENT: Negative.    Respiratory:  Negative for cough, chest tightness, shortness of breath and wheezing.   Cardiovascular:  Negative for chest pain, palpitations and leg swelling.  Gastrointestinal:  Negative for abdominal pain, diarrhea and nausea.  Musculoskeletal:  Positive for arthralgias. Negative for myalgias.  Skin: Negative.  Negative for color change and rash.  Neurological: Negative.  Negative for dizziness and weakness.  Hematological:  Negative for adenopathy. Does not  bruise/bleed easily.  Psychiatric/Behavioral:  Positive for confusion and decreased concentration. Negative for sleep disturbance. The patient is not nervous/anxious.     Objective:  BP 122/68 (BP Location: Right Arm, Patient Position: Sitting, Cuff Size: Large)   Pulse 63   Temp 98.1 F (36.7 C) (Oral)   Resp 16   Ht 5\' 5"  (1.651 m)   Wt 122 lb (55.3 kg)   SpO2 98%   BMI 20.30 kg/m   BP Readings from Last 3 Encounters:  01/19/23 122/68  01/13/23 139/87  11/30/22 (!) 115/54    Wt Readings from Last 3 Encounters:  01/19/23 122 lb (55.3 kg)  11/30/22 126 lb (57.2 kg)  09/19/22 130 lb (59 kg)    Physical Exam Vitals reviewed.  HENT:     Nose: Nose normal.     Mouth/Throat:     Mouth: Mucous membranes are moist.  Eyes:     General: No scleral icterus.    Conjunctiva/sclera: Conjunctivae normal.  Cardiovascular:     Rate and Rhythm: Normal rate and regular rhythm.     Heart sounds: No murmur heard.    No friction rub.  Pulmonary:     Effort: Pulmonary effort is normal.     Breath sounds: No stridor. No wheezing, rhonchi or rales.  Abdominal:     General: Abdomen is flat.     Palpations: There is no mass.     Tenderness: There is no abdominal tenderness. There is no guarding.     Hernia: No hernia is present.  Musculoskeletal:        General: Swelling and tenderness present. No deformity.     Right wrist: Normal.     Left wrist: Swelling and tenderness present. No effusion. Normal range of motion.     Cervical back: Neck supple.     Right lower leg: No edema.     Left lower leg: No edema.     Comments: Erythema, warmth, swelling, ttp over the dorsum of left wrist.  Lymphadenopathy:     Cervical: No cervical adenopathy.  Skin:    General: Skin is warm and dry.  Neurological:     General: No focal deficit present.     Mental Status: He is alert. Mental status is at baseline.  Psychiatric:        Mood and Affect: Mood normal.     Lab Results  Component  Value Date   WBC 4.6 01/19/2023   HGB 12.6 (L) 01/19/2023   HCT 38.4 (L) 01/19/2023   PLT 150.0 01/19/2023   GLUCOSE 142 (H) 01/19/2023   CHOL 91 (L) 04/26/2022   TRIG 78 04/26/2022   HDL 46 04/26/2022   LDLDIRECT 77.0 10/04/2017   LDLCALC 29 04/26/2022   ALT 22 04/26/2022  AST 16 04/26/2022   NA 145 01/19/2023   K 3.5 01/19/2023   CL 110 01/19/2023   CREATININE 1.72 (H) 01/19/2023   BUN 22 01/19/2023   CO2 24 01/19/2023   TSH 3.57 11/12/2019   PSA 3.49 12/04/2018   INR 1.0 05/31/2021   HGBA1C 5.6 01/19/2023    DG Wrist Complete Left  Result Date: 01/13/2023 CLINICAL DATA:  left wrist pain.  No known injury. EXAM: LEFT WRIST - COMPLETE 3+ VIEW COMPARISON:  None Available. FINDINGS: Examination is limited due to flexed fingers. No acute fracture or dislocation. No aggressive osseous lesion. Mild diffuse degenerative changes of imaged joints with asymmetric moderate-to-severe involvement of first carpometacarpal joint. Chondrocalcinosis of triangular fibrocartilage complex noted. No radiopaque foreign bodies. Vascular calcifications seen. Soft tissues are within normal limits. IMPRESSION: 1. Limited exam. 2. Degenerative changes. No acute fracture or dislocation. Electronically Signed   By: Jules Schick M.D.   On: 01/13/2023 13:58    Assessment & Plan:   Pain and swelling of wrist, left- Sx's, exam, xray, and labs are c/w acute gouty arthritis. -     Uric acid; Future -     C-reactive protein; Future  Essential hypertension- BP is well controlled -     Basic metabolic panel; Future  Iron deficiency anemia due to chronic blood loss -     CBC with Differential/Platelet; Future -     IBC + Ferritin; Future  Acute idiopathic gout of left wrist -     methylPREDNISolone Acetate -     Colchicine; Take 1 tablet (0.6 mg total) by mouth daily.  Dispense: 90 tablet; Refill: 0  Chronic hyperglycemia -     Hemoglobin A1c; Future  Flu vaccine need -     Flu Vaccine Trivalent  High Dose (Fluad)  CKD (chronic kidney disease) stage 4, GFR 15-29 ml/min (HCC)- Will avoid nephrotoxic agents.     Follow-up: Return in about 3 months (around 04/21/2023).  Sanda Linger, MD

## 2023-01-19 NOTE — Patient Instructions (Signed)
Gout  Gout is a condition that causes painful swelling of the joints. Gout is a type of inflammation of the joints (arthritis). This condition is caused by having too much uric acid in the body. Uric acid is a chemical that forms when the body breaks down substances called purines. Purines are important for building body proteins. When the body has too much uric acid, sharp crystals can form and build up inside the joints. This causes pain and swelling. Gout attacks can happen quickly and may be very painful (acute gout). Over time, the attacks can affect more joints and become more frequent (chronic gout). Gout can also cause uric acid to build up under the skin and inside the kidneys. What are the causes? This condition is caused by too much uric acid in your blood. This can happen because: Your kidneys do not remove enough uric acid from your blood. This is the most common cause. Your body makes too much uric acid. This can happen with some cancers and cancer treatments. It can also occur if your body is breaking down too many red blood cells (hemolytic anemia). You eat too many foods that are high in purines. These foods include organ meats and some seafood. Alcohol, especially beer, is also high in purines. A gout attack may be triggered by trauma or stress. What increases the risk? The following factors may make you more likely to develop this condition: Having a family history of gout. Being male and middle-aged. Being male and having gone through menopause. Taking certain medicines, including aspirin, cyclosporine, diuretics, levodopa, and niacin. Having an organ transplant. Having certain conditions, such as: Being obese. Lead poisoning. Kidney disease. A skin condition called psoriasis. Other factors include: Losing weight too quickly. Being dehydrated. Frequently drinking alcohol, especially beer. Frequently drinking beverages that are sweetened with a type of sugar called  fructose. What are the signs or symptoms? An attack of acute gout happens quickly. It usually occurs in just one joint. The most common place is the big toe. Attacks often start at night. Other joints that may be affected include joints of the feet, ankle, knee, fingers, wrist, or elbow. Symptoms of this condition may include: Severe pain. Warmth. Swelling. Stiffness. Tenderness. The affected joint may be very painful to touch. Shiny, red, or purple skin. Chills and fever. Chronic gout may cause symptoms more frequently. More joints may be involved. You may also have white or yellow lumps (tophi) on your hands or feet or in other areas near your joints. How is this diagnosed? This condition is diagnosed based on your symptoms, your medical history, and a physical exam. You may have tests, such as: Blood tests to measure uric acid levels. Removal of joint fluid with a thin needle (aspiration) to look for uric acid crystals. X-rays to look for joint damage. How is this treated? Treatment for this condition has two phases: treating an acute attack and preventing future attacks. Acute gout treatment may include medicines to reduce pain and swelling, including: NSAIDs, such as ibuprofen. Steroids. These are strong anti-inflammatory medicines that can be taken by mouth (orally) or injected into a joint. Colchicine. This medicine relieves pain and swelling when it is taken soon after an attack. It can be given by mouth or through an IV. Preventive treatment may include: Daily use of smaller doses of NSAIDs or colchicine. Use of a medicine that reduces uric acid levels in your blood, such as allopurinol. Changes to your diet. You may need to see   a dietitian about what to eat and drink to prevent gout. Follow these instructions at home: During a gout attack  If directed, put ice on the affected area. To do this: Put ice in a plastic bag. Place a towel between your skin and the bag. Leave the  ice on for 20 minutes, 2-3 times a day. Remove the ice if your skin turns bright red. This is very important. If you cannot feel pain, heat, or cold, you have a greater risk of damage to the area. Raise (elevate) the affected joint above the level of your heart as often as possible. Rest the joint as much as possible. If the affected joint is in your leg, you may be given crutches to use. Follow instructions from your health care provider about eating or drinking restrictions. Avoiding future gout attacks Follow a low-purine diet as told by your dietitian or health care provider. Avoid foods and drinks that are high in purines, including liver, kidney, anchovies, asparagus, herring, mushrooms, mussels, and beer. Maintain a healthy weight or lose weight if you are overweight. If you want to lose weight, talk with your health care provider. Do not lose weight too quickly. Start or maintain an exercise program as told by your health care provider. Eating and drinking Avoid drinking beverages that contain fructose. Drink enough fluids to keep your urine pale yellow. If you drink alcohol: Limit how much you have to: 0-1 drink a day for women who are not pregnant. 0-2 drinks a day for men. Know how much alcohol is in a drink. In the [place] one drink equals one 12 oz bottle of beer (355 mL), one 5 oz glass of wine (148 mL), or one 1 oz glass of hard liquor (44 mL). General instructions Take over-the-counter and prescription medicines only as told by your health care provider. Ask your health care provider if the medicine prescribed to you requires you to avoid driving or using machinery. Return to your normal activities as told by your health care provider. Ask your health care provider what activities are safe for you. Keep all follow-up visits. This is important. Where to find more information National Institutes of Health: www.niams.nih.gov Contact a health care provider if you have: Another  gout attack. Continuing symptoms of a gout attack after 10 days of treatment. Side effects from your medicines. Chills or a fever. Burning pain when you urinate. Pain in your lower back or abdomen. Get help right away if you: Have severe or uncontrolled pain. Cannot urinate. Summary Gout is painful swelling of the joints caused by having too much uric acid in the body. The most common site for gout to occur is in the big toe, but it can affect other joints in the body. Medicines and dietary changes can help to prevent and treat gout attacks. This information is not intended to replace advice given to you by your health care provider. Make sure you discuss any questions you have with your health care provider. Document Revised: 12/30/2020 Document Reviewed: 12/30/2020 Elsevier Patient Education  2024 Elsevier Inc.  

## 2023-01-22 ENCOUNTER — Other Ambulatory Visit: Payer: Self-pay | Admitting: Cardiology

## 2023-01-22 DIAGNOSIS — E785 Hyperlipidemia, unspecified: Secondary | ICD-10-CM

## 2023-01-22 DIAGNOSIS — I2510 Atherosclerotic heart disease of native coronary artery without angina pectoris: Secondary | ICD-10-CM

## 2023-01-22 DIAGNOSIS — I10 Essential (primary) hypertension: Secondary | ICD-10-CM

## 2023-01-23 ENCOUNTER — Telehealth: Payer: Self-pay

## 2023-01-23 NOTE — Progress Notes (Signed)
Care Guide Note  01/23/2023 Name: Bruce Jordan. MRN: [id number] DOB: February 03, 1946  Referred by: Etta Grandchild, MD Reason for referral : Care Coordination (Outreach to schedule with Pharm d )   [name]. is a 77 y.o. year old male who is a primary care patient of Etta Grandchild, MD. [name] was referred to the pharmacist for assistance related to HTN.    Successful contact was made with the patient to discuss pharmacy services including being ready for the pharmacist to call at least 5 minutes before the scheduled appointment time, to have medication bottles and any blood sugar or blood pressure readings ready for review. The patient agreed to meet with the pharmacist via with the pharmacist via telephone visit on (date/time).  02/03/2023  Penne Lash, RMA Care Guide [facility]  [place], [place] [address] Direct Dial: [phone] [email or web address]

## 2023-01-23 NOTE — Progress Notes (Signed)
Care Guide Note  01/23/2023 Name: Merton Osler. MRN: [id number] DOB: [birth year]  Referred by: Etta Grandchild, MD Reason for referral : Care Coordination (Outreach to schedule with Pharm d )   Niklas Fairfield. is a 77 y.o. year old male who is a primary care patient of Etta Grandchild, MD. [name] was referred to the pharmacist for assistance related to HTN.    An unsuccessful telephone outreach was attempted today to contact the patient who was referred to the pharmacy team for assistance with medication management. Additional attempts will be made to contact the patient.   Penne Lash, RMA Care Guide [facility]  [place], [place] [address] Direct Dial: [phone] [email or web address]

## 2023-02-03 ENCOUNTER — Other Ambulatory Visit: Payer: Medicare Other | Admitting: Pharmacist

## 2023-02-03 ENCOUNTER — Other Ambulatory Visit: Payer: Medicare Other

## 2023-02-03 DIAGNOSIS — M10032 Idiopathic gout, left wrist: Secondary | ICD-10-CM

## 2023-02-03 MED ORDER — COLCHICINE 0.6 MG PO TABS
ORAL_TABLET | ORAL | Status: AC
Start: 2023-02-03 — End: ?

## 2023-02-03 NOTE — Patient Instructions (Signed)
It was a pleasure speaking with you today!  Bruce Jordan can stop daily colchicine if his gout symptoms has been resolved for at least 1-2 days.  You can keep colchicine on hand to use for a gout flare in the future. He should take 2 tablets (1.2 mg) at first sign of a gout flare, then take 1 tablet (0.6 mg) 1 hour later then stop. If gout symptoms are not improved after 1-2, make appt with PCP for steroid injection.  We must avoid long term use of colchicine due to his kidney function.  Feel free to call with any questions or concerns!  Arbutus Leas, PharmD, BCPS [facility] Clinical Pharmacist [facility] [phone]

## 2023-02-03 NOTE — Progress Notes (Cosign Needed Addendum)
02/03/2023 Name: Bruce Jordan. MRN: [id number] DOB: [birth year]  Chief Complaint  Patient presents with   Gout   Medication Management    Bruce Jordan. is a 77 y.o. year old male who presented for a telephone visit.   They were referred to the pharmacist by their PCP for assistance in managing  gout and CKD . Spoke to pt's wife, Victorino Dike.   Subjective:  Care Team: Primary Care Provider: Etta Grandchild, MD ; Next Scheduled Visit: 04/24/2023   Medication Access/Adherence  Current Pharmacy:  [facility] [address] [place] - [address] [place] [address] Phone: [phone] Fax: [phone]   Patient reports affordability concerns with their medications: No  Patient reports access/transportation concerns to their pharmacy: No  Patient reports adherence concerns with their medications:  No     Gout: Current medications: colchicine 0.6 mg daily  Pt's wife reports gout symptoms have completely resolved.   Objective:  Lab Results  Component Value Date   LABURIC 6.7 01/19/2023     Lab Results  Component Value Date   HGBA1C 5.6 01/19/2023    Lab Results  Component Value Date   CREATININE 1.72 (H) 01/19/2023   BUN 22 01/19/2023   NA 145 01/19/2023   K 3.5 01/19/2023   CL 110 01/19/2023   CO2 24 01/19/2023    Lab Results  Component Value Date   CHOL 91 (L) 04/26/2022   HDL 46 04/26/2022   LDLCALC 29 04/26/2022   LDLDIRECT 77.0 10/04/2017   TRIG 78 04/26/2022   CHOLHDL 2.0 04/26/2022    Medications Reviewed Today   Medications were not reviewed in this encounter       Assessment/Plan:   Gout: Currently controlled. Due to calculated CrCl of 27 ml/min, recommended patient stop colchicine if symptoms have been resolved for 1-2 days. Can use colchicine PRN if gout flare occurs in the future. Dosing recommendation for renal dose adjustment is to take 1.2 mg at the first sign of a flare and  then one dose of 0.6 mg one hour later then stop OR take 0.3 mg at first sign of flare and then repeat in 3 days if symptoms persist.  If gout flare occurs in the future and symptoms persist for 2 or more days after taking colchicine, recommend scheduling an appt to get steroid injection. Reviewed foods to avoid/limit to prevent gout flares  Follow Up Plan: PRN  Arbutus Leas, PharmD, BCPS Clinical Pharmacist [facility] [phone]

## 2023-02-07 ENCOUNTER — Other Ambulatory Visit: Payer: Self-pay | Admitting: Adult Health

## 2023-02-09 NOTE — Telephone Encounter (Signed)
Pt's wife called wanting to know when this will be filled because they will be leaving out of town tomorrow.

## 2023-02-26 DIAGNOSIS — Z23 Encounter for immunization: Secondary | ICD-10-CM | POA: Diagnosis not present

## 2023-02-28 ENCOUNTER — Ambulatory Visit (INDEPENDENT_AMBULATORY_CARE_PROVIDER_SITE_OTHER): Payer: Medicare Other

## 2023-02-28 DIAGNOSIS — Z0000 Encounter for general adult medical examination without abnormal findings: Secondary | ICD-10-CM | POA: Diagnosis not present

## 2023-02-28 NOTE — Progress Notes (Signed)
Subjective:   Bruce Jordan. is a 77 y.o. male who presents for Medicare Annual/Subsequent preventive examination.  Visit Complete: Virtual I connected with  [name]   Wife assisted with exam on 02/28/23 by a video and audio enabled telemedicine application and verified that I am speaking with the correct person using two identifiers.  Patient Location: Home  Provider Location: Home Office  I discussed the limitations of evaluation and management by telemedicine. The patient expressed understanding and agreed to proceed.  Vital Signs: Because this visit was a virtual/telehealth visit, some criteria may be missing or patient reported. Any vitals not documented were not able to be obtained and vitals that have been documented are patient reported.    Cardiac Risk Factors include: advanced age (>33men, >51 women);male gender;hypertension     Objective:    Today's Vitals   02/28/23 1514  PainSc: 5    There is no height or weight on file to calculate BMI.     02/28/2023    3:17 PM 10/22/2021    3:20 PM 10/20/2020    1:02 PM 05/13/2017    6:31 PM  Advanced Directives  Does Patient Have a Medical Advance Directive? Yes Yes Yes No  Type of Estate agent of [employer] Power of [place];Living will Living will;Healthcare Power of Attorney   Does patient want to make changes to medical advance directive?  No - Patient declined No - Patient declined   Copy of Healthcare Power of Attorney in Chart? No - copy requested No - copy requested No - copy requested     Current Medications (verified) Outpatient Encounter Medications as of 02/28/2023  Medication Sig   albuterol (PROVENTIL HFA;VENTOLIN HFA) 108 (90 Base) MCG/ACT inhaler Inhale 1-2 puffs into the lungs every 6 (six) hours as needed for wheezing or shortness of breath.   Ascorbic Acid (VITAMIN C) 100 MG tablet Take 100 mg by mouth daily.   aspirin EC 81 MG tablet Take 81 mg by mouth  daily.   atorvastatin (LIPITOR) 40 MG tablet Take 1 tablet by mouth once daily   Bepotastine Besilate (BEPREVE) 1.5 % SOLN Place 1 drop into both eyes 2 (two) times daily.   Cetirizine HCl (ZYRTEC PO) Take 10 mg by mouth daily.   cholecalciferol (VITAMIN D) 400 units TABS tablet Take 400 Units by mouth daily.   citalopram (CELEXA) 10 MG tablet Take 1 tablet by mouth once daily   clobetasol cream (TEMOVATE) 0.05 % Apply 1 application topically 2 (two) times daily as needed (rash).   colchicine 0.6 MG tablet Take 2 tablets at first sign of gout flare then 1 tablet one hour later then stop. If symptoms to do not improve after 1-2 days, see PCP.   Cyanocobalamin (B-12) 1000 MCG CAPS    diclofenac sodium (VOLTAREN) 1 % GEL Apply 2 g topically 4 (four) times daily as needed (pain).   donepezil (ARICEPT) 10 MG tablet TAKE 1 TABLET BY MOUTH AT BEDTIME   EPINEPHrine (EPIPEN IJ) Inject as directed as directed.   ferrous sulfate 325 (65 FE) MG tablet Take 325 mg by mouth daily with breakfast.   folic acid (FOLVITE) 1 MG tablet TAKE 1 TABLET BY MOUTH EVERY OTHER DAY   lisinopril (ZESTRIL) 20 MG tablet Take 1 tablet by mouth once daily   memantine (NAMENDA) 10 MG tablet Take 1 tablet (10 mg total) by mouth 2 (two) times daily.   Misc Natural Products (TART CHERRY ADVANCED PO)  Take by mouth.   Multiple Vitamins-Minerals (PRESERVISION AREDS 2+MULTI VIT PO) Take 1 tablet by mouth in the morning and at bedtime.   sildenafil (VIAGRA) 100 MG tablet TAKE 1 TABLET BY MOUTH ONCE DAILY AS NEEDED FOR ERECTILE DYSFUNCTION   TURMERIC PO Take by mouth.   ezetimibe (ZETIA) 10 MG tablet Take 1 tablet (10 mg total) by mouth daily.   No facility-administered encounter medications on file as of 02/28/2023.    Allergies (verified) Patient has no allergy information on record.   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   Vitamin B12 deficiency anemia 12/04/2018   Word finding difficulty 10/21/2020   Past Surgical History:  Procedure Laterality Date   CARDIAC CATHETERIZATION     Family History  Problem Relation Age of Onset   Hypertension Mother    Heart disease Mother    Heart disease Father    Cancer Father    Alzheimer's disease Sister    Dementia Sister    Early death Brother 79       MVA   Stroke Brother    Early death Brother 49       Epilepsy   Social History   Socioeconomic History   Marital status: Married    Spouse name: Not on file   Number of children: Not on file   Years of education: 12   Highest education level: 11th grade  Occupational History   Occupation: Retired    Comment: Mechanic/maintenance  Tobacco Use   Smoking status: Former   Smokeless tobacco: Never  Advertising account planner   Vaping status: Never  Used  Substance and Sexual Activity   Alcohol use: Yes    Comment: a cocktail once in a while   Drug use: No   Sexual activity: Yes    Birth control/protection: None  Other Topics Concern   Not on file  Social History Narrative   Lives at home with wife    Right handed   Caffeine: 2 cups/day of coke    Social Determinants of Health   Financial Resource Strain: Low Risk  (02/28/2023)   Overall Financial Resource Strain (CARDIA)    Difficulty of Paying Living Expenses: Not hard at all  Food Insecurity: No Food Insecurity (02/28/2023)   Hunger Vital Sign    Worried About Running Out of Food in the Last Year: Never true  Ran Out of Food in the Last Year: Never true  Transportation Needs: No Transportation Needs (02/28/2023)   PRAPARE - Administrator, Civil Service (Medical): No    Lack of Transportation (Non-Medical): No  Physical Activity: Inactive (02/28/2023)   Exercise Vital Sign    Days of Exercise per Week: 0 days    Minutes of Exercise per Session: 0 min  Stress: No Stress Concern Present (02/28/2023)   [employer] of Occupational Health - Occupational Stress Questionnaire    Feeling of Stress : Not at all  Social Connections: Socially Isolated (02/28/2023)   Social Connection and Isolation Panel [NHANES]    Frequency of Communication with Friends and Family: Once a week    Frequency of Social Gatherings with Friends and Family: Never    Attends Religious Services: Never    Database administrator or Organizations: No    Attends Engineer, structural: Never    Marital Status: Married    Tobacco Counseling Counseling given: Not Answered   Clinical Intake:  Pre-visit preparation completed: Yes  Pain : 0-10 Pain Score: 5  Pain Type: Chronic pain Pain Location: Wrist Pain Orientation: Right, Left Pain Descriptors / Indicators: Burning, Constant, Aching Pain Onset: More than a month ago Pain Frequency: Constant Effect of Pain on  Daily Activities: voltaren     Diabetes: No  How often do you need to have someone help you when you read instructions, pamphlets, or other written materials from your doctor or pharmacy?: 4 - Often  Interpreter Needed?: No  Information entered by :: Remi Haggard LPN   Activities of Daily Living    02/28/2023    3:17 PM  In your present state of health, do you have any difficulty performing the following activities:  Hearing? 1  Vision? 0  Difficulty concentrating or making decisions? 1  Walking or climbing stairs? 0  Dressing or bathing? 0  Doing errands, shopping? 1  Preparing Food and eating ? Y  Using the Toilet? N  In the past six months, have you accidently leaked urine? N  Do you have problems with loss of bowel control? N  Managing your Medications? Y  Managing your Finances? Y  Housekeeping or managing your Housekeeping? Y    Patient Care Team: Etta Grandchild, MD as PCP - General (Internal Medicine) Sinda Du, MD as Consulting Physician (Ophthalmology) Butch Penny, NP as Registered Nurse (Neurology) Alfredo Martinez, MD as Consulting Physician (Urology) [place], Peter M, MD as Consulting Physician (Cardiology)  Indicate any recent Medical Services you may have received from other than [facility] providers in the past year (date may be approximate).     Assessment:   This is a routine wellness examination for Zollie.  Hearing/Vision screen Hearing Screening - Comments:: Bilateral hearing aids Vision Screening - Comments:: [facility] Up to date   Goals Addressed   None    Depression Screen    02/28/2023    3:20 PM 05/18/2022    1:45 PM 04/26/2022    3:21 PM 10/22/2021    3:23 PM 05/31/2021   11:51 AM 10/20/2020    1:01 PM 02/24/2020    4:53 PM  PHQ 2/9 Scores  PHQ - 2 Score 1 0 0 0 0 0 0  PHQ- 9 Score 3 0 0    0    Fall Risk    02/28/2023    3:16 PM 05/18/2022    1:45 PM 04/26/2022    3:21 PM 10/22/2021  3:21 PM 05/31/2021   11:51 AM  Fall Risk    Falls in the past year? 0 0 1 0 0  Number falls in past yr: 0 0 0 0 0  Injury with Fall? 0 0 0 0 0  Risk for fall due to :  No Fall Risks No Fall Risks No Fall Risks   Follow up Falls evaluation completed;Education provided;Falls prevention discussed Falls evaluation completed Falls evaluation completed Falls evaluation completed     MEDICARE RISK AT HOME: Medicare Risk at Home Any stairs in or around the home?: No If so, are there any without handrails?: No Adequate lighting in your home to reduce risk of falls?: Yes Life alert?: No Use of a cane, walker or w/c?: No Grab bars in the bathroom?: Yes Shower chair or bench in shower?: No Elevated toilet seat or a handicapped toilet?: No  TIMED UP AND GO:  Was the test performed?  No    Cognitive Function:    11/30/2022    3:01 PM 05/31/2022    2:28 PM 11/09/2021    1:32 PM 10/22/2021    3:40 PM 04/29/2021    3:04 PM  MMSE - Mini Mental State Exam  Not completed:    Unable to complete   Orientation to time 0 1 2  1   Orientation to Place 1 2 1  4   Registration 3 3 3  3   Attention/ Calculation 0 0 0  1  Recall 0 3 2  0  Language- name 2 objects 2 2 2  2   Language- repeat 0 1 1  1   Language- follow 3 step command 3 3 3  3   Language- read & follow direction 0 1 1  1   Write a sentence 1 0 1  1  Copy design 0 0 0  1  Total score 10 16 16  18         02/28/2023    3:20 PM  6CIT Screen  What Year? 4 points  What month? 0 points  What time? 3 points  Count back from 20 0 points  Months in reverse 4 points  Repeat phrase 10 points  Total Score 21 points    Immunizations Immunization History  Administered Date(s) Administered   Fluad Quad(high Dose 65+) 01/06/2019, 01/20/2020   Fluad Trivalent(High Dose 65+) 01/19/2023   Influenza Split 02/06/2014, 01/20/2020   Influenza, High Dose Seasonal PF 01/05/2015, 03/11/2016, 12/26/2016, 02/12/2018   Influenza-Unspecified 01/31/2021, 02/04/2022   Moderna Covid-19 Vaccine  Bivalent Booster 31yrs & up 01/31/2021   Moderna Sars-Covid-2 Vaccination 02/09/2020, 02/10/2020   PFIZER(Purple Top)SARS-COV-2 Vaccination 04/29/2019, 05/13/2019, 02/26/2023   Pfizer Covid-19 Vaccine Bivalent Booster 27yrs & up 02/04/2022   Pneumococcal Conjugate-13 12/02/2012   Pneumococcal Polysaccharide-23 05/25/2016, 05/18/2022   Tdap 12/26/2016    TDAP status: Up to date  Flu Vaccine status: Up to date  Pneumococcal vaccine status: Up to date  Covid-19 vaccine status: Completed vaccines  Qualifies for Shingles Vaccine? Yes   Zostavax completed No   Shingrix Completed?: No.    Education has been provided regarding the importance of this vaccine. Patient has been advised to call insurance company to determine out of pocket expense if they have not yet received this vaccine. Advised may also receive vaccine at local pharmacy or Health Dept. Verbalized acceptance and understanding.  Screening Tests Health Maintenance  Topic Date Due   Diabetic kidney evaluation - Urine ACR  03/01/2023 (Originally 08/26/1963)   OPHTHALMOLOGY EXAM  03/10/2023 (Originally 08/26/1955)  Zoster Vaccines- Shingrix (1 of 2) 05/31/2023 (Originally 08/26/1993)   FOOT EXAM  02/28/2024 (Originally 08/26/1955)   COVID-19 Vaccine (8 - 2023-24 season) 04/23/2023   HEMOGLOBIN A1C  07/20/2023   Diabetic kidney evaluation - eGFR measurement  01/19/2024   Medicare Annual Wellness (AWV)  02/28/2024   DTaP/Tdap/Td (2 - Td or Tdap) 12/27/2026   Pneumonia Vaccine 52+ Years old  Completed   INFLUENZA VACCINE  Completed   Hepatitis C Screening  Completed   HPV VACCINES  Aged Out   Colonoscopy  Discontinued    Health Maintenance  There are no preventive care reminders to display for this patient.   Colorectal cancer screening: No longer required.   Lung Cancer Screening: (Low Dose CT Chest recommended if Age 83-80 years, 20 pack-year currently smoking OR have quit w/in 15years.) does not qualify.   Lung Cancer  Screening Referral:   Additional Screening:  Hepatitis C Screening:  qualify; Completed 2020  Vision Screening: Recommended annual ophthalmology exams for early detection of glaucoma and other disorders of the eye. Is the patient up to date with their annual eye exam?  Yes  Who is the provider or what is the name of the office in which the patient attends annual eye exams? [facility] If pt is not established with a provider, would they like to be referred to a provider to establish care? No .   Dental Screening: Recommended annual dental exams for proper oral hygiene    Community Resource Referral / Chronic Care Management: CRR required this visit?  No   CCM required this visit?  No     Plan:     I have personally reviewed and noted the following in the patient's chart:   Medical and social history Use of alcohol, tobacco or illicit drugs  Current medications and supplements including opioid prescriptions. Patient is not currently taking opioid prescriptions. Functional ability and status Nutritional status Physical activity Advanced directives List of other physicians Hospitalizations, surgeries, and ER visits in previous 12 months Vitals Screenings to include cognitive, depression, and falls Referrals and appointments  In addition, I have reviewed and discussed with patient certain preventive protocols, quality metrics, and best practice recommendations. A written personalized care plan for preventive services as well as general preventive health recommendations were provided to patient.     Remi Haggard, LPN   [id number]/[id number]   After Visit Summary: (MyChart) Due to this being a telephonic visit, the after visit summary with patients personalized plan was offered to patient via MyChart   Nurse Notes:

## 2023-02-28 NOTE — Patient Instructions (Signed)
[name] , Thank you for taking time to come for your Medicare Wellness Visit. I appreciate your ongoing commitment to your health goals. Please review the following plan we discussed and let me know if I can assist you in the future.   Screening recommendations/referrals: Colonoscopy: no longer required Recommended yearly ophthalmology/optometry visit for glaucoma screening and checkup Recommended yearly dental visit for hygiene and checkup  Vaccinations: Influenza vaccine: up to date Pneumococcal vaccine: up to date Tdap vaccine: up to date Shingles vaccine: Education provided    Advanced directives   yes not on file    Preventive Care 65 Years and Older, Male Preventive care refers to lifestyle choices and visits with your health care provider that can promote health and wellness. What does preventive care include? A yearly physical exam. This is also called an annual well check. Dental exams once or twice a year. Routine eye exams. Ask your health care provider how often you should have your eyes checked. Personal lifestyle choices, including: Daily care of your teeth and gums. Regular physical activity. Eating a healthy diet. Avoiding tobacco and drug use. Limiting alcohol use. Practicing safe sex. Taking low doses of aspirin every day. Taking vitamin and mineral supplements as recommended by your health care provider. What happens during an annual well check? The services and screenings done by your health care provider during your annual well check will depend on your age, overall health, lifestyle risk factors, and family history of disease. Counseling  Your health care provider may ask you questions about your: Alcohol use. Tobacco use. Drug use. Emotional well-being. Home and relationship well-being. Sexual activity. Eating habits. History of falls. Memory and ability to understand (cognition). Work and work Astronomer. Screening  You may have the  following tests or measurements: Height, weight, and BMI. Blood pressure. Lipid and cholesterol levels. These may be checked every 5 years, or more frequently if you are over 29 years old. Skin check. Lung cancer screening. You may have this screening every year starting at age 7 if you have a 30-pack-year history of smoking and currently smoke or have quit within the past 15 years. Fecal occult blood test (FOBT) of the stool. You may have this test every year starting at age 13. Flexible sigmoidoscopy or colonoscopy. You may have a sigmoidoscopy every 5 years or a colonoscopy every 10 years starting at age 56. Prostate cancer screening. Recommendations will vary depending on your family history and other risks. Hepatitis C blood test. Hepatitis B blood test. Sexually transmitted disease (STD) testing. Diabetes screening. This is done by checking your blood sugar (glucose) after you have not eaten for a while (fasting). You may have this done every 1-3 years. Abdominal aortic aneurysm (AAA) screening. You may need this if you are a current or former smoker. Osteoporosis. You may be screened starting at age 13 if you are at high risk. Talk with your health care provider about your test results, treatment options, and if necessary, the need for more tests. Vaccines  Your health care provider may recommend certain vaccines, such as: Influenza vaccine. This is recommended every year. Tetanus, diphtheria, and acellular pertussis (Tdap, Td) vaccine. You may need a Td booster every 10 years. Zoster vaccine. You may need this after age 79. Pneumococcal 13-valent conjugate (PCV13) vaccine. One dose is recommended after age 7. Pneumococcal polysaccharide (PPSV23) vaccine. One dose is recommended after age 55. Talk to your health care provider about which screenings and vaccines you need and how often  you need them. This information is not intended to replace advice given to you by your health care  provider. Make sure you discuss any questions you have with your health care provider. Document Released: 04/24/2015 Document Revised: 12/16/2015 Document Reviewed: 01/27/2015 Elsevier Interactive Patient Education  2017 [employer].  Fall Prevention in the Home Falls can cause injuries. They can happen to people of all ages. There are many things you can do to make your home safe and to help prevent falls. What can I do on the outside of my home? Regularly fix the edges of walkways and driveways and fix any cracks. Remove anything that might make you trip as you walk through a door, such as a raised step or threshold. Trim any bushes or trees on the path to your home. Use bright outdoor lighting. Clear any walking paths of anything that might make someone trip, such as rocks or tools. Regularly check to see if handrails are loose or broken. Make sure that both sides of any steps have handrails. Any raised decks and porches should have guardrails on the edges. Have any leaves, snow, or ice cleared regularly. Use sand or salt on walking paths during winter. Clean up any spills in your garage right away. This includes oil or grease spills. What can I do in the bathroom? Use night lights. Install grab bars by the toilet and in the tub and shower. Do not use towel bars as grab bars. Use non-skid mats or decals in the tub or shower. If you need to sit down in the shower, use a plastic, non-slip stool. Keep the floor dry. Clean up any water that spills on the floor as soon as it happens. Remove soap buildup in the tub or shower regularly. Attach bath mats securely with double-sided non-slip rug tape. Do not have throw rugs and other things on the floor that can make you trip. What can I do in the bedroom? Use night lights. Make sure that you have a light by your bed that is easy to reach. Do not use any sheets or blankets that are too big for your bed. They should not hang down onto the  floor. Have a firm chair that has side arms. You can use this for support while you get dressed. Do not have throw rugs and other things on the floor that can make you trip. What can I do in the kitchen? Clean up any spills right away. Avoid walking on wet floors. Keep items that you use a lot in easy-to-reach places. If you need to reach something above you, use a strong step stool that has a grab bar. Keep electrical cords out of the way. Do not use floor polish or wax that makes floors slippery. If you must use wax, use non-skid floor wax. Do not have throw rugs and other things on the floor that can make you trip. What can I do with my stairs? Do not leave any items on the stairs. Make sure that there are handrails on both sides of the stairs and use them. Fix handrails that are broken or loose. Make sure that handrails are as long as the stairways. Check any carpeting to make sure that it is firmly attached to the stairs. Fix any carpet that is loose or worn. Avoid having throw rugs at the top or bottom of the stairs. If you do have throw rugs, attach them to the floor with carpet tape. Make sure that you have a light  switch at the top of the stairs and the bottom of the stairs. If you do not have them, ask someone to add them for you. What else can I do to help prevent falls? Wear shoes that: Do not have high heels. Have rubber bottoms. Are comfortable and fit you well. Are closed at the toe. Do not wear sandals. If you use a stepladder: Make sure that it is fully opened. Do not climb a closed stepladder. Make sure that both sides of the stepladder are locked into place. Ask someone to hold it for you, if possible. Clearly mark and make sure that you can see: Any grab bars or handrails. First and last steps. Where the edge of each step is. Use tools that help you move around (mobility aids) if they are needed. These include: Canes. Walkers. Scooters. Crutches. Turn on the  lights when you go into a dark area. Replace any light bulbs as soon as they burn out. Set up your furniture so you have a clear path. Avoid moving your furniture around. If any of your floors are uneven, fix them. If there are any pets around you, be aware of where they are. Review your medicines with your doctor. Some medicines can make you feel dizzy. This can increase your chance of falling. Ask your doctor what other things that you can do to help prevent falls. This information is not intended to replace advice given to you by your health care provider. Make sure you discuss any questions you have with your health care provider. Document Released: 01/22/2009 Document Revised: 09/03/2015 Document Reviewed: 05/02/2014 Elsevier Interactive Patient Education  2017 [employer].

## 2023-04-17 ENCOUNTER — Other Ambulatory Visit: Payer: Self-pay | Admitting: Cardiology

## 2023-04-17 ENCOUNTER — Other Ambulatory Visit: Payer: Self-pay | Admitting: Internal Medicine

## 2023-04-17 DIAGNOSIS — F418 Other specified anxiety disorders: Secondary | ICD-10-CM

## 2023-04-17 DIAGNOSIS — D520 Dietary folate deficiency anemia: Secondary | ICD-10-CM

## 2023-04-24 ENCOUNTER — Encounter: Payer: Self-pay | Admitting: Internal Medicine

## 2023-04-24 ENCOUNTER — Ambulatory Visit: Payer: Medicare Other | Admitting: Internal Medicine

## 2023-04-24 VITALS — BP 128/72 | HR 58 | Temp 98.1°F | Resp 16 | Ht 65.0 in | Wt 116.2 lb

## 2023-04-24 DIAGNOSIS — I10 Essential (primary) hypertension: Secondary | ICD-10-CM

## 2023-04-24 DIAGNOSIS — I2510 Atherosclerotic heart disease of native coronary artery without angina pectoris: Secondary | ICD-10-CM

## 2023-04-24 DIAGNOSIS — D513 Other dietary vitamin B12 deficiency anemia: Secondary | ICD-10-CM

## 2023-04-24 DIAGNOSIS — E785 Hyperlipidemia, unspecified: Secondary | ICD-10-CM

## 2023-04-24 DIAGNOSIS — M19011 Primary osteoarthritis, right shoulder: Secondary | ICD-10-CM | POA: Diagnosis not present

## 2023-04-24 DIAGNOSIS — D520 Dietary folate deficiency anemia: Secondary | ICD-10-CM

## 2023-04-24 DIAGNOSIS — R001 Bradycardia, unspecified: Secondary | ICD-10-CM

## 2023-04-24 DIAGNOSIS — R0609 Other forms of dyspnea: Secondary | ICD-10-CM

## 2023-04-24 DIAGNOSIS — D500 Iron deficiency anemia secondary to blood loss (chronic): Secondary | ICD-10-CM | POA: Diagnosis not present

## 2023-04-24 DIAGNOSIS — M19012 Primary osteoarthritis, left shoulder: Secondary | ICD-10-CM

## 2023-04-24 DIAGNOSIS — R002 Palpitations: Secondary | ICD-10-CM

## 2023-04-24 LAB — HEPATIC FUNCTION PANEL
ALT: 12 U/L (ref 0–53)
AST: 16 U/L (ref 0–37)
Albumin: 4.2 g/dL (ref 3.5–5.2)
Alkaline Phosphatase: 89 U/L (ref 39–117)
Bilirubin, Direct: 0.2 mg/dL (ref 0.0–0.3)
Total Bilirubin: 0.8 mg/dL (ref 0.2–1.2)
Total Protein: 6.4 g/dL (ref 6.0–8.3)

## 2023-04-24 LAB — CBC WITH DIFFERENTIAL/PLATELET
Basophils Absolute: 0 10*3/uL (ref 0.0–0.1)
Basophils Relative: 0.8 % (ref 0.0–3.0)
Eosinophils Absolute: 0.2 10*3/uL (ref 0.0–0.7)
Eosinophils Relative: 5.7 % — ABNORMAL HIGH (ref 0.0–5.0)
HCT: 37.9 % — ABNORMAL LOW (ref 39.0–52.0)
Hemoglobin: 12.6 g/dL — ABNORMAL LOW (ref 13.0–17.0)
Lymphocytes Relative: 11 % — ABNORMAL LOW (ref 12.0–46.0)
Lymphs Abs: 0.4 10*3/uL — ABNORMAL LOW (ref 0.7–4.0)
MCHC: 33.2 g/dL (ref 30.0–36.0)
MCV: 96.8 fL (ref 78.0–100.0)
Monocytes Absolute: 0.5 10*3/uL (ref 0.1–1.0)
Monocytes Relative: 13.4 % — ABNORMAL HIGH (ref 3.0–12.0)
Neutro Abs: 2.8 10*3/uL (ref 1.4–7.7)
Neutrophils Relative %: 69.1 % (ref 43.0–77.0)
Platelets: 122 10*3/uL — ABNORMAL LOW (ref 150.0–400.0)
RBC: 3.92 Mil/uL — ABNORMAL LOW (ref 4.22–5.81)
RDW: 14.7 % (ref 11.5–15.5)
WBC: 4 10*3/uL (ref 4.0–10.5)

## 2023-04-24 LAB — TROPONIN I (HIGH SENSITIVITY): High Sens Troponin I: 6 ng/L (ref 2–17)

## 2023-04-24 LAB — BRAIN NATRIURETIC PEPTIDE: Pro B Natriuretic peptide (BNP): 42 pg/mL (ref 0.0–100.0)

## 2023-04-24 NOTE — Progress Notes (Signed)
 Subjective:  Patient ID: Bruce Arnone., male    DOB: 07-27-1945  Age: 78 y.o. MRN: [id number]  CC: Hypertension   HPI Bruce Dains. presents for f/up ----  Discussed the use of AI scribe software for clinical note transcription with the patient, who gave verbal consent to proceed.  History of Present Illness   The patient, with a history of shoulder pain, presents with recent exacerbation of symptoms after moving trash cans in the snow. He describes the pain as severe, but denies any resulting immobility. For pain management, he is currently on Voltaren , but denies the use of Tylenol.  Recently, the patient has been experiencing episodes of shortness of breath, particularly after exertion such as showering or moving through snow. He describes a sensation of his heart 'pounding' during these episodes. He also reports a significant weight loss, decreased appetite, and a tendency to sleep throughout the day. Despite these symptoms, his blood pressure remains stable at 128/72.  The patient also mentions a loss of driving privileges, but it is unclear if this is related to his current health concerns. He is currently taking multivitamins and vitamin C.       Outpatient Medications Prior to Visit  Medication Sig Dispense Refill   albuterol  (PROVENTIL  HFA;VENTOLIN  HFA) 108 (90 Base) MCG/ACT inhaler Inhale 1-2 puffs into the lungs every 6 (six) hours as needed for wheezing or shortness of breath. 18 g 3   Ascorbic Acid (VITAMIN C) 100 MG tablet Take 100 mg by mouth daily.     aspirin EC 81 MG tablet Take 81 mg by mouth daily.     atorvastatin  (LIPITOR) 40 MG tablet Take 1 tablet by mouth once daily 90 tablet 1   Bepotastine  Besilate (BEPREVE) 1.5 % SOLN Place 1 drop into both eyes 2 (two) times daily. 10 mL 1   Cetirizine  HCl (ZYRTEC  PO) Take 10 mg by mouth daily.     cholecalciferol (VITAMIN D) 400 units TABS tablet Take 400 Units by mouth daily.     citalopram  (CELEXA ) 10 MG  tablet Take 1 tablet by mouth once daily 90 tablet 0   clobetasol  cream (TEMOVATE ) 0.05 % Apply 1 application topically 2 (two) times daily as needed (rash). 60 g 3   colchicine  0.6 MG tablet Take 2 tablets at first sign of gout flare then 1 tablet one hour later then stop. If symptoms to do not improve after 1-2 days, see PCP.     Cyanocobalamin  (B-12) 1000 MCG CAPS      diclofenac  sodium (VOLTAREN ) 1 % GEL Apply 2 g topically 4 (four) times daily as needed (pain). 150 g 0   donepezil  (ARICEPT ) 10 MG tablet TAKE 1 TABLET BY MOUTH AT BEDTIME 90 tablet 2   EPINEPHrine  (EPIPEN  IJ) Inject as directed as directed.     ezetimibe  (ZETIA ) 10 MG tablet Take 1 tablet by mouth once daily 90 tablet 1   ferrous sulfate 325 (65 FE) MG tablet Take 325 mg by mouth daily with breakfast.     folic acid  (FOLVITE ) 1 MG tablet TAKE 1 TABLET BY MOUTH EVERY OTHER DAY 45 tablet 0   lisinopril  (ZESTRIL ) 20 MG tablet Take 1 tablet by mouth once daily 90 tablet 1   memantine  (NAMENDA ) 10 MG tablet Take 1 tablet (10 mg total) by mouth 2 (two) times daily. 180 tablet 3   Misc Natural Products (TART CHERRY ADVANCED PO) Take by mouth.     Multiple Vitamins-Minerals (PRESERVISION AREDS 2+MULTI  VIT PO) Take 1 tablet by mouth in the morning and at bedtime.     sildenafil  (VIAGRA ) 100 MG tablet TAKE 1 TABLET BY MOUTH ONCE DAILY AS NEEDED FOR ERECTILE DYSFUNCTION 5 tablet 3   TURMERIC PO Take by mouth.     No facility-administered medications prior to visit.    ROS Review of Systems  Constitutional: Negative.  Negative for diaphoresis, fatigue and fever.  HENT: Negative.    Respiratory:  Positive for shortness of breath. Negative for cough, chest tightness and wheezing.   Cardiovascular:  Negative for chest pain, palpitations and leg swelling.  Gastrointestinal: Negative.  Negative for abdominal pain, constipation, diarrhea, nausea and vomiting.  Genitourinary: Negative.  Negative for difficulty urinating.   Musculoskeletal:  Positive for arthralgias. Negative for myalgias.  Skin: Negative.   Neurological:  Negative for dizziness and weakness.  Hematological:  Negative for adenopathy. Does not bruise/bleed easily.  Psychiatric/Behavioral:  Positive for confusion and decreased concentration.     Objective:  BP 128/72 (BP Location: Left Arm, Patient Position: Sitting, Cuff Size: Normal)   Pulse (!) 58   Temp 98.1 F (36.7 C) (Oral)   Resp 16   Ht 5' 5 (1.651 m)   Wt 116 lb 3.2 oz (52.7 kg)   SpO2 98%   BMI 19.34 kg/m   BP Readings from Last 3 Encounters:  04/24/23 128/72  01/19/23 122/68  01/13/23 139/87    Wt Readings from Last 3 Encounters:  04/24/23 116 lb 3.2 oz (52.7 kg)  01/19/23 122 lb (55.3 kg)  11/30/22 126 lb (57.2 kg)    Physical Exam Vitals reviewed.  Constitutional:      Appearance: Normal appearance.  HENT:     Nose: Nose normal.     Mouth/Throat:     Mouth: Mucous membranes are moist.  Eyes:     General: No scleral icterus.    Conjunctiva/sclera: Conjunctivae normal.  Cardiovascular:     Rate and Rhythm: Bradycardia present. Occasional Extrasystoles are present.    Heart sounds: No murmur heard.    No gallop.     Comments: EKG- SB with SA and occ PVC, 53 bpm ++artifact NS ST/T wave changes Pulmonary:     Effort: Pulmonary effort is normal.     Breath sounds: No stridor. No wheezing, rhonchi or rales.  Abdominal:     General: Abdomen is flat.     Palpations: There is no mass.     Tenderness: There is no abdominal tenderness. There is no guarding.     Hernia: No hernia is present.  Musculoskeletal:        General: Normal range of motion.     Cervical back: Neck supple.     Right lower leg: No edema.     Left lower leg: No edema.  Skin:    General: Skin is warm and dry.  Neurological:     General: No focal deficit present.     Mental Status: He is alert. Mental status is at baseline.  Psychiatric:        Mood and Affect: Mood normal.         Behavior: Behavior normal.     Lab Results  Component Value Date   WBC 4.0 04/24/2023   HGB 12.6 (L) 04/24/2023   HCT 37.9 (L) 04/24/2023   PLT 122.0 (L) 04/24/2023   GLUCOSE 142 (H) 01/19/2023   CHOL 91 (L) 04/26/2022   TRIG 78 04/26/2022   HDL 46 04/26/2022   LDLDIRECT 77.0 10/04/2017  LDLCALC 29 04/26/2022   ALT 12 04/24/2023   AST 16 04/24/2023   NA 145 01/19/2023   K 3.5 01/19/2023   CL 110 01/19/2023   CREATININE 1.72 (H) 01/19/2023   BUN 22 01/19/2023   CO2 24 01/19/2023   TSH 1.67 04/24/2023   PSA 3.49 12/04/2018   INR 1.0 05/31/2021   HGBA1C 5.6 01/19/2023    DG Wrist Complete Left Result Date: 01/13/2023 CLINICAL DATA:  left wrist pain.  No known injury. EXAM: LEFT WRIST - COMPLETE 3+ VIEW COMPARISON:  None Available. FINDINGS: Examination is limited due to flexed fingers. No acute fracture or dislocation. No aggressive osseous lesion. Mild diffuse degenerative changes of imaged joints with asymmetric moderate-to-severe involvement of first carpometacarpal joint. Chondrocalcinosis of triangular fibrocartilage complex noted. No radiopaque foreign bodies. Vascular calcifications seen. Soft tissues are within normal limits. IMPRESSION: 1. Limited exam. 2. Degenerative changes. No acute fracture or dislocation. Electronically Signed   By: Ree Molt M.D.   On: 01/13/2023 13:58    Assessment & Plan:  Coronary artery disease involving native coronary artery of native heart without angina pectoris -     Troponin I (High Sensitivity); Future -     Brain natriuretic peptide; Future  DOE (dyspnea on exertion)- EKG and labs are reassuring. -     EKG 12-Lead -     CBC with Differential/Platelet; Future -     Troponin I (High Sensitivity); Future -     Brain natriuretic peptide; Future  Palpitations -     EKG 12-Lead  Iron deficiency anemia due to chronic blood loss -     CBC with Differential/Platelet; Future  Vitamin B12 deficiency anemia -     Vitamin B12;  Future -     CBC with Differential/Platelet; Future -     Folate; Future  Dietary folate deficiency anemia -     CBC with Differential/Platelet; Future  Essential hypertension -     TSH; Future  Bradycardia on ECG -     TSH; Future  Hyperlipidemia with target LDL less than 70 -     Hepatic function panel; Future  Primary osteoarthritis of both shoulders -     traMADol  HCl; Take 1 tablet by mouth 2 (two) times daily as needed.  Dispense: 180 tablet; Refill: 0     Follow-up: Return in about 3 months (around 07/23/2023).  Debby Molt, MD

## 2023-04-24 NOTE — Patient Instructions (Signed)
 Bradycardia, Adult Bradycardia is a slower-than-normal heartbeat. A normal resting heart rate for an adult ranges from 60 to 100 beats per minute. With bradycardia, the resting heart rate is less than 60 beats per minute. Bradycardia can prevent enough oxygen  from reaching certain areas of your body when you are active. It can be serious if it keeps enough oxygen  from reaching your brain and other parts of your body. Bradycardia is not a problem for everyone. For some healthy adults, a slow resting heart rate is normal. What are the causes? This condition may be caused by: A problem with the heart, including: A problem with the heart's electrical system, such as a heart block. With a heart block, electrical signals between the chambers of the heart are partially or completely blocked, so they are not able to work as they should. A problem with the heart's natural pacemaker (sinus node). Heart disease. A heart attack. Heart damage. Lyme disease. A heart infection. A heart condition that is present at birth (congenital heart defect). Certain medicines that treat heart conditions. Certain conditions, such as hypothyroidism and obstructive sleep apnea. Problems with the balance of chemicals and other substances, like potassium, in the blood. Trauma. Radiation therapy. What increases the risk? You are more likely to develop this condition if you: Are age 30 or older. Have high blood pressure (hypertension), high cholesterol (hyperlipidemia), or diabetes. Drink heavily, use tobacco or nicotine products, or use drugs. What are the signs or symptoms? Symptoms of this condition include: Light-headedness. Feeling faint or fainting. Fatigue and weakness. Trouble with activity or exercise. Shortness of breath. Chest pain (angina). Drowsiness. Confusion. Dizziness. How is this diagnosed? This condition may be diagnosed based on: Your symptoms. Your medical history. A physical exam. During  the exam, your health care provider will listen to your heartbeat and check your pulse. To confirm the diagnosis, your health care provider may order tests, such as: Blood tests. An electrocardiogram (ECG). This test records the heart's electrical activity. The test can show how fast your heart is beating and whether the heartbeat is steady. A test in which you wear a portable device (event recorder or Holter monitor) to record your heart's electrical activity while you go about your day. An exercise test. How is this treated? Treatment for this condition depends on the cause of the condition and how severe your symptoms are. Treatment may involve: Treatment of the underlying condition. Changing your medicines or how much medicine you take. Having a small, battery-operated device called a pacemaker implanted under the skin. When bradycardia occurs, this device can be used to increase your heart rate and help your heart beat in a regular rhythm. Follow these instructions at home: Lifestyle Manage any health conditions that contribute to bradycardia as told by your health care provider. Follow a heart-healthy diet. A nutrition specialist (dietitian) can help educate you about healthy food options and changes. Follow an exercise program that is approved by your health care provider. Maintain a healthy weight. Try to reduce or manage your stress, such as with yoga or meditation. If you need help reducing stress, ask your health care provider. Do not use any products that contain nicotine or tobacco. These products include cigarettes, chewing tobacco, and vaping devices, such as e-cigarettes. If you need help quitting, ask your health care provider. Do not use illegal drugs. Alcohol  use If you drink alcohol : Limit how much you have to: 0-1 drink a day for women who are not pregnant. 0-2 drinks a day  for men. Know how much alcohol  is in a drink. In the [place] one drink equals one 12 oz bottle of  beer (355 mL), one 5 oz glass of wine (148 mL), or one 1 oz glass of hard liquor (44 mL). General instructions Take over-the-counter and prescription medicines only as told by your health care provider. Keep all follow-up visits. This is important. How is this prevented? In some cases, bradycardia may be prevented by: Treating underlying medical problems. Stopping behaviors or medicines that can trigger the condition. Contact a health care provider if: You feel light-headed or dizzy. You almost faint. You feel weak or are easily fatigued during physical activity. You experience confusion or have memory problems. Get help right away if: You faint. You have chest pains or an irregular heartbeat (palpitations). You have trouble breathing. These symptoms may represent a serious problem that is an emergency. Do not wait to see if the symptoms will go away. Get medical help right away. Call your local emergency services (911 in the [place] Do not drive yourself to the hospital. Summary Bradycardia is a slower-than-normal heartbeat. With bradycardia, the resting heart rate is less than 60 beats per minute. Treatment for this condition depends on the cause. Manage any health conditions that contribute to bradycardia as told by your health care provider. Do not use any products that contain nicotine or tobacco. These products include cigarettes, chewing tobacco, and vaping devices, such as e-cigarettes. Keep all follow-up visits. This is important. This information is not intended to replace advice given to you by your health care provider. Make sure you discuss any questions you have with your health care provider. Document Revised: 07/19/2020 Document Reviewed: 07/19/2020 Elsevier Patient Education  2024 [employer].

## 2023-04-25 LAB — FOLATE: Folate: >25.2 ng/mL (ref >5.9)

## 2023-04-25 LAB — VITAMIN B12: Vitamin B-12: 584 pg/mL (ref 211–911)

## 2023-04-25 LAB — TSH: TSH: 1.67 u[IU]/mL (ref 0.35–5.50)

## 2023-04-25 MED ORDER — TRAMADOL HCL 25 MG PO TABS: 1.0000 | ORAL_TABLET | Freq: Two times a day (BID) | ORAL | 0 refills | Status: AC | PRN

## 2023-05-15 ENCOUNTER — Emergency Department (HOSPITAL_COMMUNITY): Payer: Medicare Other

## 2023-05-15 ENCOUNTER — Emergency Department (HOSPITAL_COMMUNITY)
Admission: EM | Admit: 2023-05-15 | Discharge: 2023-05-15 | Discharge status: Against Medical Advice | Payer: Medicare Other | Attending: Emergency Medicine | Admitting: Emergency Medicine

## 2023-05-15 ENCOUNTER — Encounter (HOSPITAL_COMMUNITY): Payer: Self-pay

## 2023-05-15 ENCOUNTER — Other Ambulatory Visit: Payer: Self-pay

## 2023-05-15 DIAGNOSIS — R0602 Shortness of breath: Secondary | ICD-10-CM | POA: Insufficient documentation

## 2023-05-15 DIAGNOSIS — Z5321 Procedure and treatment not carried out due to patient leaving prior to being seen by health care provider: Secondary | ICD-10-CM | POA: Insufficient documentation

## 2023-05-15 DIAGNOSIS — Z951 Presence of aortocoronary bypass graft: Secondary | ICD-10-CM | POA: Insufficient documentation

## 2023-05-15 DIAGNOSIS — R079 Chest pain, unspecified: Secondary | ICD-10-CM | POA: Insufficient documentation

## 2023-05-15 DIAGNOSIS — F0390 Unspecified dementia without behavioral disturbance: Secondary | ICD-10-CM | POA: Diagnosis not present

## 2023-05-15 LAB — BASIC METABOLIC PANEL
Anion gap: 14 (ref 5–15)
BUN: 16 mg/dL (ref 8–23)
CO2: 21 mmol/L — ABNORMAL LOW (ref 22–32)
Calcium: 9.2 mg/dL (ref 8.9–10.3)
Chloride: 105 mmol/L (ref 98–111)
Creatinine, Ser: 1.86 mg/dL — ABNORMAL HIGH (ref 0.61–1.24)
GFR, Estimated: 36 mL/min — ABNORMAL LOW (ref >60)
Glucose, Bld: 104 mg/dL — ABNORMAL HIGH (ref 70–99)
Potassium: 3.2 mmol/L — ABNORMAL LOW (ref 3.5–5.1)
Sodium: 140 mmol/L (ref 135–145)

## 2023-05-15 LAB — CBC
HCT: 35.7 % — ABNORMAL LOW (ref 39.0–52.0)
Hemoglobin: 11.5 g/dL — ABNORMAL LOW (ref 13.0–17.0)
MCH: 31.7 pg (ref 26.0–34.0)
MCHC: 32.2 g/dL (ref 30.0–36.0)
MCV: 98.3 fL (ref 80.0–100.0)
Platelets: 107 10*3/uL — ABNORMAL LOW (ref 150–400)
RBC: 3.63 MIL/uL — ABNORMAL LOW (ref 4.22–5.81)
RDW: 13.3 % (ref 11.5–15.5)
WBC: 3.6 10*3/uL — ABNORMAL LOW (ref 4.0–10.5)
nRBC: 0 % (ref 0.0–0.2)

## 2023-05-15 LAB — TROPONIN I (HIGH SENSITIVITY): Troponin I (High Sensitivity): 6 ng/L (ref <18)

## 2023-05-15 NOTE — ED Triage Notes (Signed)
Pt reports SHOB at rest x1 day. Pt also endorses L sided CP.

## 2023-05-15 NOTE — ED Provider Triage Note (Signed)
Emergency Medicine Provider Triage Evaluation Note  Bruce Jordan. , a 78 y.o. male  was evaluated in triage.  Pt complains of CP and SOB x 1 hr. Brought in by family. Pt with hx of dementia, CABG x 1 in 2015.  Pt localizes pain to mid-left chest, does not radiate. Maybe worse with deep breathing  Review of Systems  Positive: CP, SOB Negative: Leg pain or swelling  Physical Exam  BP (!) 148/68   Pulse (!) 58   Resp 17   Ht 5\' 5"  (1.651 m)   Wt 52.6 kg   SpO2 100%   BMI 19.30 kg/m  Gen:   Awake, no distress   Resp:  Normal effort  MSK:   Moves extremities without difficulty  Other:    Medical Decision Making  Medically screening exam initiated at 5:32 PM.  Appropriate orders placed.  Bruce Mo. was informed that the remainder of the evaluation will be completed by another provider, this initial triage assessment does not replace that evaluation, and the importance of remaining in the ED until their evaluation is complete.  Workup initiated    Bruce Monks, PA-C 05/15/23 1733

## 2023-05-17 NOTE — Progress Notes (Signed)
 Cardiology Office Note    Date:  05/19/2023   ID:  Bruce Jordan., DOB 05/18/1945, MRN [id number]  PCP:  Joshua Debby CROME, MD  Cardiologist:  Trysten Berti, MD    History of Present Illness:  Bruce Broadfoot. is a 78 y.o. male seen for follow up CAD. He has a history of CAD s/p CABG in 2008 in [place]  for failed PCI. This was a LIMA to the LAD. In 2013 he had PCI of a tortuous RCA. In April 2016 he had a low risk Cardiolite without evidence of ischemia. He has a history of HTN and dyslipidemia. 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  He moved to [place] to be closer to family. He lives in  an Apartment with his wife.  They are planning to move to the [facility] area. He is very sedentary. Sleeps much of the day. Notes when he takes a shower he gets tired and has to rest. Not drinking fluids well- only drinks cokes. Was seen recently in ED. Reported difficulty catching his breath while watching TV with some pain on deep breathing. Labs in the ED were OK. Ecg unchanged. CXR is clear.    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 18 g 3   Ascorbic Acid (VITAMIN C) 100 MG tablet Take 100 mg by mouth daily.     aspirin EC 81 MG tablet Take 81 mg by mouth daily.     atorvastatin  (LIPITOR) 40 MG tablet Take 1 tablet by mouth once daily 90 tablet 1   Bepotastine  Besilate (BEPREVE) 1.5 % SOLN Place 1 drop into both eyes 2 (two) times daily. 10 mL 1   Cetirizine  HCl (ZYRTEC  PO) Take 10 mg by mouth daily.     cholecalciferol (VITAMIN D) 400 units TABS tablet Take 400 Units by mouth daily.     citalopram  (CELEXA ) 10 MG tablet Take 1  tablet by mouth once daily 90 tablet 0   clobetasol  cream (TEMOVATE ) 0.05 % Apply 1 application topically 2 (two) times daily as needed (rash). 60 g 3   colchicine  0.6 MG tablet Take 2 tablets at first sign of gout flare then 1 tablet one hour later then stop. If symptoms to do not improve after 1-2 days, see PCP.     Cyanocobalamin  (B-12) 1000 MCG CAPS      diclofenac  sodium (VOLTAREN ) 1 % GEL Apply 2 g topically 4 (four) times daily as needed (pain). 150 g 0   donepezil  (ARICEPT ) 10 MG tablet TAKE 1 TABLET BY MOUTH AT BEDTIME 90 tablet 2   EPINEPHrine  (EPIPEN  IJ) Inject as directed as directed.     ferrous sulfate 325 (65 FE) MG  tablet Take 325 mg by mouth daily with breakfast.     folic acid  (FOLVITE ) 1 MG tablet TAKE 1 TABLET BY MOUTH EVERY OTHER DAY 45 tablet 0   lisinopril  (ZESTRIL ) 20 MG tablet Take 1 tablet by mouth once daily 90 tablet 1   memantine  (NAMENDA ) 10 MG tablet Take 1 tablet (10 mg total) by mouth 2 (two) times daily. 180 tablet 3   Misc Natural Products (TART CHERRY ADVANCED PO) Take by mouth.     Multiple Vitamins-Minerals (PRESERVISION AREDS 2+MULTI VIT PO) Take 1 tablet by mouth in the morning and at bedtime.     sildenafil  (VIAGRA ) 100 MG tablet TAKE 1 TABLET BY MOUTH ONCE DAILY AS NEEDED FOR ERECTILE DYSFUNCTION 5 tablet 3   traMADol  HCl 25 MG TABS Take 1 tablet by mouth 2 (two) times daily as needed. 180 tablet 0   TURMERIC PO Take by mouth.     ezetimibe  (ZETIA ) 10 MG tablet Take 1 tablet by mouth once daily (Patient not taking: Reported on 05/19/2023) 90 tablet 1   No facility-administered medications prior to visit.     Allergies:   Patient has no known allergies.   Social History   Socioeconomic History   Marital status: Married    Spouse name: Not on file   Number of children: Not on file   Years of education: 12   Highest education level: 10th grade  Occupational History   Occupation: Retired    Comment: Mechanic/maintenance  Tobacco Use   Smoking status: Former   Smokeless tobacco: Never  Building Services Engineer status: Never Used  Substance and Sexual Activity   Alcohol use: Yes    Comment: a cocktail once in a while   Drug use: No   Sexual activity: Yes    Birth control/protection: None  Other Topics Concern   Not on file  Social History Narrative   Lives at home with wife    Right handed   Caffeine: 2 cups/day of coke    Social Drivers of Corporate Investment Banker Strain: Medium Risk (04/23/2023)   Overall Financial Resource Strain (CARDIA)    Difficulty of Paying Living Expenses: Somewhat hard  Food Insecurity: No Food Insecurity (04/23/2023)   Hunger Vital  Sign    Worried About Running Out of Food in the Last Year: Never true    Ran Out of Food in the Last Year: Never true  Transportation Needs: No Transportation Needs (04/23/2023)   PRAPARE - Administrator, Civil Service (Medical): No    Lack of Transportation (Non-Medical): No  Physical Activity: Inactive (04/23/2023)   Exercise Vital Sign    Days of Exercise per Week: 0 days  Minutes of Exercise per Session: 0 min  Stress: No Stress Concern Present (04/23/2023)   [employer] of Occupational Health - Occupational Stress Questionnaire    Feeling of Stress : Not at all  Social Connections: Socially Isolated (04/23/2023)   Social Connection and Isolation Panel [NHANES]    Frequency of Communication with Friends and Family: Once a week    Frequency of Social Gatherings with Friends and Family: Once a week    Attends Religious Services: Never    Database Administrator or Organizations: No    Attends Engineer, Structural: Never    Marital Status: Married     Family History:  The patient's family history includes Alzheimer's disease in his sister; Cancer in his father; Dementia in his sister; Early death (age of onset: 66) in his brother; Early death (age of onset: 56) in his brother; Heart disease in his father and mother; Hypertension in his mother; Stroke in his brother.   ROS:   Please see the history of present illness.    ROS All other systems reviewed and are negative.   PHYSICAL EXAM:   VS:  BP 130/70   Pulse (!) 52   Ht 5' 5 (1.651 m)   Wt 120 lb (54.4 kg)   SpO2 98%   BMI 19.97 kg/m    GENERAL:  very thin elderly WM in NAD HEENT:  PERRL, EOMI, sclera are clear. Oropharynx is clear. NECK:  No jugular venous distention, carotid upstroke brisk and symmetric, no bruits, no thyromegaly or adenopathy LUNGS:  Clear to auscultation bilaterally CHEST:  Unremarkable HEART:  RRR with extrasystoles,  PMI not displaced or sustained,S1 and S2 within normal  limits, no S3, no S4: no clicks, no rubs, no murmurs ABD:  Soft, nontender. BS +, no masses or bruits. No hepatomegaly, no splenomegaly EXT:  2 + pulses throughout, no edema, no cyanosis no clubbing SKIN:  Warm and dry.  No rashes NEURO:  Alert and oriented x 3. Cranial nerves II through XII intact. PSYCH:  Cognitively intact   Wt Readings from Last 3 Encounters:  05/19/23 120 lb (54.4 kg)  05/15/23 116 lb (52.6 kg)  04/24/23 116 lb 3.2 oz (52.7 kg)      Studies/Labs Reviewed:   EKG:  from ED reviewed. Sinus brady. No ST T changes from prior. I have personally reviewed and interpreted this study.     Recent Labs: 04/24/2023: ALT 12; Pro B Natriuretic peptide (BNP) 42.0; TSH 1.67 05/15/2023: BUN 16; Creatinine, Ser 1.86; Hemoglobin 11.5; Platelets 107; Potassium 3.2; Sodium 140   Lipid Panel    Component Value Date/Time   CHOL 91 (L) 04/26/2022 0951   TRIG 78 04/26/2022 0951   HDL 46 04/26/2022 0951   CHOLHDL 2.0 04/26/2022 0951   CHOLHDL 3.2 11/12/2019 0932   VLDL 35.8 12/04/2018 1534   LDLCALC 29 04/26/2022 0951   LDLCALC 81 11/12/2019 0932   LDLDIRECT 77.0 10/04/2017 1607    Additional studies/ records that were reviewed today include:  Labs dated 08/24/15: glucose 177, creatinine 1.47. Otherwise CMET normal. Platelets 118K, otherwise CBC is normal. A1c 5.9%. TSH normal. Cholesterol 170, triglycerides 119, LDL 85, HDL 61. LDL particle number 995. Dated 05/26/16: cholesterol 144, triglycerides 98, HDL 62, LDL 62. A1c 5.7%. Creatinine 1.27. Hgb, TSH, ALT normal. Dated 10/04/17: cholesterol 162, triglycerides 240, HDL 70, LDL 77. A1c 5.7%. Creatinine 1.41. ALT normal. CBC normal.   ASSESSMENT:   CAD s/p CABG in 2008. Last Myoview in 2016  looked good. He is asymptomatic. Continue ASA and statin. Not a candidate for beta blocker due to bradycardia. Encourage increased activity. I think his fatigue is more related to deconditioning.  HTN well controlled. On  lisinopril . Hypercholesterolemia. On high dose statin. Labs excellent in last January with LDL 29. Off Zetia  now CKD stage 3b. Last creatinine 1.86. Encourage hydration. Avoid NSAIDs.  Dementia - followed by Neurology.  Follow up one year unless they move in which case can establish with cardiology there    Medication Adjustments/Labs and Tests Ordered: Current medicines are reviewed at length with the patient today.  Concerns regarding medicines are outlined above.  Medication changes, Labs and Tests ordered today are listed in the Patient Instructions below. Patient Instructions  Medication Instructions:  Continue same medications *If you need a refill on your cardiac medications before your next appointment, please call your pharmacy*   Lab Work: None ordered   Testing/Procedures: None ordered   Follow-Up: At [facility], you and your health needs are our priority.  As part of our continuing mission to provide you with exceptional heart care, we have created designated Provider Care Teams.  These Care Teams include your primary Cardiologist (physician) and Advanced Practice Providers (APPs -  Physician Assistants and Nurse Practitioners) who all work together to provide you with the care you need, when you need it.  We recommend signing up for the patient portal called MyChart.  Sign up information is provided on this After Visit Summary.  MyChart is used to connect with patients for Virtual Visits (Telemedicine).  Patients are able to view lab/test results, encounter notes, upcoming appointments, etc.  Non-urgent messages can be sent to your provider as well.   To learn more about what you can do with MyChart, go to [email or web address].    Your next appointment:  1 Year    Call in Oct to schedule Feb appointment     Provider:  Dr.Travius Crochet          Signed, Keland Peyton, MD  05/19/2023 8:36 AM    [facility] [address]  [place], [place], [address] [phone]

## 2023-05-19 ENCOUNTER — Encounter: Payer: Self-pay | Admitting: Cardiology

## 2023-05-19 ENCOUNTER — Ambulatory Visit: Payer: Medicare Other | Attending: Cardiology | Admitting: Cardiology

## 2023-05-19 VITALS — BP 130/70 | HR 52 | Ht 65.0 in | Wt 120.0 lb

## 2023-05-19 DIAGNOSIS — I2510 Atherosclerotic heart disease of native coronary artery without angina pectoris: Secondary | ICD-10-CM | POA: Insufficient documentation

## 2023-05-19 DIAGNOSIS — I10 Essential (primary) hypertension: Secondary | ICD-10-CM | POA: Diagnosis not present

## 2023-05-19 DIAGNOSIS — E785 Hyperlipidemia, unspecified: Secondary | ICD-10-CM | POA: Diagnosis not present

## 2023-05-19 NOTE — Patient Instructions (Signed)
 Medication Instructions:  Continue same medications *If you need a refill on your cardiac medications before your next appointment, please call your pharmacy*   Lab Work: None ordered   Testing/Procedures: None ordered   Follow-Up: At [facility], you and your health needs are our priority.  As part of our continuing mission to provide you with exceptional heart care, we have created designated Provider Care Teams.  These Care Teams include your primary Cardiologist (physician) and Advanced Practice Providers (APPs -  Physician Assistants and Nurse Practitioners) who all work together to provide you with the care you need, when you need it.  We recommend signing up for the patient portal called MyChart.  Sign up information is provided on this After Visit Summary.  MyChart is used to connect with patients for Virtual Visits (Telemedicine).  Patients are able to view lab/test results, encounter notes, upcoming appointments, etc.  Non-urgent messages can be sent to your provider as well.   To learn more about what you can do with MyChart, go to [email or web address].    Your next appointment:  1 Year    Call in Oct to schedule Feb appointment     Provider:  Dr.Jordan

## 2023-07-17 ENCOUNTER — Encounter: Payer: Self-pay | Admitting: Adult Health

## 2023-07-17 ENCOUNTER — Ambulatory Visit: Payer: Medicare Other | Admitting: Adult Health

## 2023-07-17 ENCOUNTER — Ambulatory Visit (INDEPENDENT_AMBULATORY_CARE_PROVIDER_SITE_OTHER): Payer: Medicare Other | Admitting: Adult Health

## 2023-07-17 VITALS — BP 108/88 | HR 68 | Ht 65.0 in | Wt 116.8 lb

## 2023-07-17 DIAGNOSIS — F0280 Dementia in other diseases classified elsewhere without behavioral disturbance: Secondary | ICD-10-CM | POA: Diagnosis not present

## 2023-07-17 DIAGNOSIS — G309 Alzheimer's disease, unspecified: Secondary | ICD-10-CM

## 2023-07-17 NOTE — Patient Instructions (Signed)
 Your Plan:  Continue Namenda and Aricept  Monitor heart rate and for bradycardia If your symptoms worsen or you develop new symptoms please let us know.   Thank you for coming to see [place] at [facility]. I hope we have been able to provide you high quality care today.  You may receive a patient satisfaction survey over the next few weeks. We would appreciate your feedback and comments so that we may continue to improve ourselves and the health of our patients.

## 2023-07-17 NOTE — Progress Notes (Signed)
 PATIENT: Bruce Jordan. DOB: 1945/11/09  REASON FOR VISIT: follow up HISTORY FROM: patient PRIMARY NEUROLOGIST: Bruce Jordan  Chief Complaint  Patient presents with   Room 19    Pt is here with his Wife. Pt's wife states that pt is declining fast. Pt's wife states that some days are better than others. Pt's wife states that pt was sleeping a lot 20 hours per day. Pt's wife states that pt's appetite is not good. Pt's wife states that pt sometimes gets his days and nights mixed up.      HISTORY OF PRESENT ILLNESS: Today 07/17/23:  Bruce Jordan. is a 78 y.o. male with a history of Alzheimer's disease. Returns today for follow-up.  Overall he feels that his memory has been fair.  Continues to live at home with his wife.  They will be moving to [facility] April 21.  Overall he is doing most ADLs independently.  His wife reports that he may need some help with activities such as shaving.  He no longer does any household chores.  He was able to do his own laundry but now has had trouble operating the wash machine.  His wife manages all the finances.  She also keeps up with his medications and appointments.  No change in mood or behavior.  No longer driving.  Remains on Aricept and Namenda.   11/30/22: Bruce Jordan. is a 78 y.o. male with a history of Alzheimer's disease. Returns today for follow-up.  Overall he feels that he has remained stable.  Wife reports that he sleeps a lot. Patient reports that he doesn't have any friends so he sleeps because he is bored.  Able to complete all ADLs independently.  His wife continues to manage medications appointments and finances.No change in mood or behavior. Remains on Aricept and Namenda.   2/20/24Andrew T Harkirat Jordan. is a 78 y.o. male with a history of alzheimer's disease. Returns today for follow-up.  Feels that his memory is the same. Wife feels that some days or better than others. Able to complete complete ADLS independently. wife  Manages medications, appointments and finances. Sleeping ok. Denies any change in mood or behavior. Operates a motor vehicle without difficulty. Currently on Aricept and Namenda.  Patient's wife reports that they will be moving to [facility] with her daughter.  They plan to move August.   11/09/21: Bruce Jordan is a 78 year old male with a history of memory disturbance most consistent with alzheimer disease. He returns today for follow-up. Lives with his wife. Able to complete ADLs independently. Completes household chores- makes his bed. Wife manages meds and appointments. Denies any trouble sleeping. Drives to familiar places close to home.   04/29/21: Bruce Jordan is a 78 year old male with a history of memory disturbance most consistent with Alzheimer's disease.  He returns today with his wife.  He continues to live at home with her.  Reports that he is able to complete all ADLs independently.  He completes household chores.  She does help him with his medications and appointments.  Denies any trouble sleeping.  No change in mood or behavior.  He continues to operate a motor vehicle but reports that he only drives to places close to where he lives.  His wife notes that she has noticed no issues with his driving.  He returns today for an evaluation.  HISTORY (copied from Dr. Trevor Mace note)  Interval history 12/03/2020: At last appointment we found  B12 133. I don't see an MRI completed. Last B12 normal 10/2020. Folate was low however at 4.7. bmp with slightly reduced gfr. Cbc unremarkable except plts 140 and lymphopenia. He did not follow up after last appointment. PCP has addressed the folate and asked him to supplement. Memory is getting worse. Repeating the formal memory testing. Dr. Clayborn Heron. MRI brain. Sister recently diagnosed with dementia, they think Alzheimers. He is still very social, walks every day, no falls, no physical changes, no depression. See prior extensive History below.    HPI  06/21/2018:  Bruce Nelles. is a 78 y.o. male here as requested by Etta Grandchild, MD for memory loss. PMHx osteoarthritis, impaired fasting glucose, hypertension, asthma, bradycardia, depression, hyperlipidemia, coronary artery disease, chronic ischemic heart disease.  He is here with his wife who provides much information.He doesn't remember things he used to remember. Started 3 years and slowly progressive. He was working in [place] and when he came home he had a difficult time using the remote. It is more short-term memory. If someone tells him something he forgets, such as conversations, forgets appointments, he remembers birthdays, wife has always taken care of the bills for 33 years, he is still driving , no accidents or getting lost, he repeats questions and stories in the same day such as what time they are closing will ask 4-5 times in one day. His coworker has noticed. He doesn't know his family history and unclear if there is a history of dementia, he has hearing impairment. More short-term memory loss. He has an older sister without any memory loss. They have 1-2 glasses of alcohol a day. Still having difficulty with the remote but it is new, he still works with tools, he cooks, he makes lunch and brings it to his wife, he will make grilled cheese, he is more beligerant than he used to be, he works at [employer] and he will be more irritable and confrontational but not inappropriate behavior, no delusions or hallucinations. He feels hearing loss may be a factor as well. He is on an antidepressant and 3 years ago he had weight loss and wanted to sleep all the time and he feels better he does not feel sad most of the time. Socially nothing has changed. He takes his own medications but on a holiday it confused him when his edication was not in the right spots and wife had to help.No significant history of smoking. He feels refreshed in the morning, no snoring. wife provides much information. No other  focal neurologic deficits, associated symptoms, inciting events or modifiable factors.   Reviewed notes, labs and imaging from outside physicians, which showed:   Reviewed Dr. Yetta Barre notes.  Patient and wife complaining about short-term memory and has been declining over the last few years.  Denied any other associated events REVIEW OF SYSTEMS: Out of a complete 14 system review of symptoms, the patient complains only of the following symptoms, and all other reviewed systems are negative.  ALLERGIES: No Known Allergies  HOME MEDICATIONS: Outpatient Medications Prior to Visit  Medication Sig Dispense Refill   albuterol (PROVENTIL HFA;VENTOLIN HFA) 108 (90 Base) MCG/ACT inhaler Inhale 1-2 puffs into the lungs every 6 (six) hours as needed for wheezing or shortness of breath. 18 g 3   Ascorbic Acid (VITAMIN C) 100 MG tablet Take 100 mg by mouth daily.     aspirin EC 81 MG tablet Take 81 mg by mouth daily.     atorvastatin (  LIPITOR) 40 MG tablet Take 1 tablet by mouth once daily 90 tablet 1   Bepotastine Besilate (BEPREVE) 1.5 % SOLN Place 1 drop into both eyes 2 (two) times daily. 10 mL 1   Cetirizine HCl (ZYRTEC PO) Take 10 mg by mouth daily.     cholecalciferol (VITAMIN D) 400 units TABS tablet Take 400 Units by mouth daily.     citalopram (CELEXA) 10 MG tablet Take 1 tablet by mouth once daily 90 tablet 0   clobetasol cream (TEMOVATE) 0.05 % Apply 1 application topically 2 (two) times daily as needed (rash). 60 g 3   colchicine 0.6 MG tablet Take 2 tablets at first sign of gout flare then 1 tablet one hour later then stop. If symptoms to do not improve after 1-2 days, see PCP.     Cyanocobalamin (B-12) 1000 MCG CAPS      diclofenac sodium (VOLTAREN) 1 % GEL Apply 2 g topically 4 (four) times daily as needed (pain). 150 g 0   donepezil (ARICEPT) 10 MG tablet TAKE 1 TABLET BY MOUTH AT BEDTIME 90 tablet 2   EPINEPHrine (EPIPEN IJ) Inject as directed as directed.     ferrous sulfate 325 (65 FE)  MG tablet Take 325 mg by mouth daily with breakfast.     folic acid (FOLVITE) 1 MG tablet TAKE 1 TABLET BY MOUTH EVERY OTHER DAY 45 tablet 0   lisinopril (ZESTRIL) 20 MG tablet Take 1 tablet by mouth once daily 90 tablet 1   memantine (NAMENDA) 10 MG tablet Take 1 tablet (10 mg total) by mouth 2 (two) times daily. 180 tablet 3   Misc Natural Products (TART CHERRY ADVANCED PO) Take by mouth.     Multiple Vitamins-Minerals (PRESERVISION AREDS 2+MULTI VIT PO) Take 1 tablet by mouth in the morning and at bedtime.     sildenafil (VIAGRA) 100 MG tablet TAKE 1 TABLET BY MOUTH ONCE DAILY AS NEEDED FOR ERECTILE DYSFUNCTION 5 tablet 3   traMADol HCl 25 MG TABS Take 1 tablet by mouth 2 (two) times daily as needed. 180 tablet 0   TURMERIC PO Take by mouth.     No facility-administered medications prior to visit.    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   Vitamin B12 deficiency anemia 12/04/2018   Word finding difficulty 10/21/2020    PAST SURGICAL HISTORY: Past  Surgical History:  Procedure Laterality Date   CARDIAC CATHETERIZATION      FAMILY HISTORY: Family History  Problem Relation Age of Onset   Hypertension Mother    Heart disease Mother    Heart disease Father    Cancer Father    Alzheimer's disease Sister    Dementia Sister    Early death Brother 45       MVA   Stroke Brother    Early death Brother 61       Epilepsy    SOCIAL HISTORY: Social History   Socioeconomic History   Marital status: Married    Spouse name: Not on file   Number of children: Not on file   Years of education: 12   Highest education level: 10th grade  Occupational History   Occupation: Retired    Comment: Mechanic/maintenance  Tobacco Use   Smoking status: Former   Smokeless tobacco: Never  Building services engineer status: Never Used  Substance and Sexual Activity   Alcohol use: Yes    Comment: a cocktail once in a while   Drug use: No   Sexual activity: Yes    Birth control/protection: None  Other Topics Concern   Not on file  Social History Narrative   Lives at home with wife    Right handed   Caffeine: 2 cups/day of coke    Social Drivers of Corporate investment banker Strain: Medium Risk (04/23/2023)   Overall Financial Resource Strain (CARDIA)    Difficulty of Paying Living Expenses: Somewhat hard  Food Insecurity: No Food Insecurity (04/23/2023)   Hunger Vital Sign    Worried About Running Out of Food in the Last Year: Never true    Ran Out of Food in the Last Year: Never true  Transportation Needs: No Transportation Needs (04/23/2023)   PRAPARE - Administrator, Civil Service (Medical): No    Lack of Transportation (Non-Medical): No  Physical Activity: Inactive  (04/23/2023)   Exercise Vital Sign    Days of Exercise per Week: 0 days    Minutes of Exercise per Session: 0 min  Stress: No Stress Concern Present (04/23/2023)   [employer] of Occupational Health - Occupational Stress Questionnaire    Feeling of Stress : Not at all  Social Connections: Socially Isolated (04/23/2023)   Social Connection and Isolation Panel [NHANES]    Frequency of Communication with Friends and Family: Once a week    Frequency of Social Gatherings with Friends and Family: Once a week    Attends Religious Services: Never    Database administrator or Organizations: No    Attends Banker Meetings: Never    Marital Status: Married  Catering manager Violence: Not At Risk (02/28/2023)   Humiliation, Afraid, Rape, and Kick questionnaire    Fear of Current or Ex-Partner: No    Emotionally Abused: No    Physically Abused: No    Sexually Abused: No      PHYSICAL EXAM  Vitals:   07/17/23 0935  BP: 108/88  Pulse: 68  Weight: 116 lb 12.8 oz (53 kg)  Height: 5\' 5"  (1.651 m)     Body mass index is 19.44 kg/m.     07/17/2023    9:38 AM 11/30/2022    3:01 PM 05/31/2022    2:28 PM  MMSE - Mini Mental State Exam  Orientation to time 0 0 1  Orientation to Place 1 1 2   Registration 3  3 3  Attention/ Calculation 0 0 0  Recall 0 0 3  Language- name 2 objects 2 2 2   Language- repeat 1 0 1  Language- follow 3 step command 3 3 3   Language- read & follow direction 1 0 1  Write a sentence 0 1 0  Copy design 0 0 0  Total score 11 10 16      Generalized: Well developed, in no acute distress   Neurological examination  Mentation: Alert oriented to time, place, history taking. Follows all commands speech and language fluent Cranial nerve II-XII: Pupils were equal round reactive to light. Extraocular movements were full, visual field were full on confrontational test. Facial sensation and strength were normal.  Head turning and shoulder shrug  were normal  and symmetric. Motor: The motor testing reveals 5 over 5 strength of all 4 extremities. Good symmetric motor tone is noted throughout.  Sensory: Sensory testing is intact to soft touch on all 4 extremities. No evidence of extinction is noted.  Coordination: Cerebellar testing reveals good finger-nose-finger and heel-to-shin bilaterally.  Gait and station: Gait is normal.    DIAGNOSTIC DATA (LABS, IMAGING, TESTING) - I reviewed patient records, labs, notes, testing and imaging myself where available.  Lab Results  Component Value Date   WBC 3.6 (L) 05/15/2023   HGB 11.5 (L) 05/15/2023   HCT 35.7 (L) 05/15/2023   MCV 98.3 05/15/2023   PLT 107 (L) 05/15/2023      Component Value Date/Time   NA 140 05/15/2023 1740   NA 144 01/14/2022 0955   K 3.2 (L) 05/15/2023 1740   CL 105 05/15/2023 1740   CO2 21 (L) 05/15/2023 1740   GLUCOSE 104 (H) 05/15/2023 1740   BUN 16 05/15/2023 1740   BUN 24 01/14/2022 0955   CREATININE 1.86 (H) 05/15/2023 1740   CREATININE 1.41 (H) 11/12/2019 0932   CALCIUM 9.2 05/15/2023 1740   PROT 6.4 04/24/2023 1627   PROT 6.6 04/26/2022 0951   ALBUMIN 4.2 04/24/2023 1627   ALBUMIN 4.5 04/26/2022 0951   AST 16 04/24/2023 1627   ALT 12 04/24/2023 1627   ALKPHOS 89 04/24/2023 1627   BILITOT 0.8 04/24/2023 1627   BILITOT 0.8 04/26/2022 0951   GFRNONAA 36 (L) 05/15/2023 1740   GFRNONAA 48 (L) 11/12/2019 0932   GFRAA 56 (L) 11/12/2019 0932   Lab Results  Component Value Date   CHOL 91 (L) 04/26/2022   HDL 46 04/26/2022   LDLCALC 29 04/26/2022   LDLDIRECT 77.0 10/04/2017   TRIG 78 04/26/2022   CHOLHDL 2.0 04/26/2022   Lab Results  Component Value Date   HGBA1C 5.6 01/19/2023   Lab Results  Component Value Date   VITAMINB12 584 04/24/2023   Lab Results  Component Value Date   TSH 1.67 04/24/2023      ASSESSMENT AND PLAN 78 y.o. year old male  has a past medical history of Benign prostatic hyperplasia without lower urinary tract symptoms  (05/25/2016), Bradycardia, severe sinus (09/12/2017), Chronic ischemic heart disease, Chronic ITP (idiopathic thrombocytopenia) (05/16/2019), Coronary artery disease involving native coronary artery of native heart without angina pectoris (05/25/2016), Dementia due to Alzheimer's disease, possible (12/15/2020), Dietary folate deficiency anemia (05/16/2019), Eczema (05/25/2016), Erectile dysfunction due to arterial insufficiency (08/09/2016), Erosive oral lichen planus (05/25/2016), Essential hypertension (05/25/2016), Hyperlipidemia (05/25/2016), IFG (impaired fasting glucose), Iron deficiency anemia due to chronic blood loss (05/25/2016), Major depressive disorder (08/01/2014), Mild intermittent asthma with acute exacerbation (09/12/2017), Osteoarthritis, Prepatellar bursitis, right knee (02/24/2020), Primary osteoarthritis of  both shoulders (05/25/2016), S/P CABG x 1 (05/14/2013), Stage 3b chronic kidney disease (05/16/2019), Type 2 diabetes mellitus with diabetic nephropathy (09/09/2014), Vitamin B12 deficiency anemia (12/04/2018), and Word finding difficulty (10/21/2020). here with:  1.  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 Did advised to continue to monitor heart rate. Educated on signs and symptoms of bradycardia.  Continue Namenda 10 mg twice a day Follow-up in 6 months or sooner if needed     Butch Penny, MSN, NP-C 07/17/2023, 9:33 AM [facility] [address], [address] [place], [place] [address] [phone]

## 2023-07-24 ENCOUNTER — Ambulatory Visit: Payer: Medicare Other | Admitting: Internal Medicine

## 2023-07-24 ENCOUNTER — Other Ambulatory Visit: Payer: Self-pay | Admitting: Internal Medicine

## 2023-07-24 ENCOUNTER — Other Ambulatory Visit: Payer: Self-pay | Admitting: Cardiology

## 2023-07-24 DIAGNOSIS — F418 Other specified anxiety disorders: Secondary | ICD-10-CM

## 2023-07-24 DIAGNOSIS — D520 Dietary folate deficiency anemia: Secondary | ICD-10-CM

## 2023-07-24 DIAGNOSIS — E785 Hyperlipidemia, unspecified: Secondary | ICD-10-CM

## 2023-07-24 DIAGNOSIS — I10 Essential (primary) hypertension: Secondary | ICD-10-CM

## 2023-07-24 DIAGNOSIS — I2510 Atherosclerotic heart disease of native coronary artery without angina pectoris: Secondary | ICD-10-CM

## 2023-08-14 ENCOUNTER — Encounter: Payer: Self-pay | Admitting: Internal Medicine

## 2023-08-15 DIAGNOSIS — Z515 Encounter for palliative care: Secondary | ICD-10-CM | POA: Diagnosis not present

## 2023-08-15 DIAGNOSIS — R7989 Other specified abnormal findings of blood chemistry: Secondary | ICD-10-CM | POA: Diagnosis not present

## 2023-08-15 DIAGNOSIS — N259 Disorder resulting from impaired renal tubular function, unspecified: Secondary | ICD-10-CM | POA: Diagnosis not present

## 2023-08-15 DIAGNOSIS — N179 Acute kidney failure, unspecified: Secondary | ICD-10-CM | POA: Diagnosis not present

## 2023-08-15 DIAGNOSIS — Z66 Do not resuscitate: Secondary | ICD-10-CM | POA: Diagnosis present

## 2023-08-15 DIAGNOSIS — D696 Thrombocytopenia, unspecified: Secondary | ICD-10-CM | POA: Diagnosis not present

## 2023-08-15 DIAGNOSIS — E872 Acidosis, unspecified: Secondary | ICD-10-CM | POA: Diagnosis present

## 2023-08-15 DIAGNOSIS — Z79899 Other long term (current) drug therapy: Secondary | ICD-10-CM | POA: Diagnosis not present

## 2023-08-15 DIAGNOSIS — E785 Hyperlipidemia, unspecified: Secondary | ICD-10-CM | POA: Diagnosis not present

## 2023-08-15 DIAGNOSIS — E875 Hyperkalemia: Secondary | ICD-10-CM | POA: Diagnosis present

## 2023-08-15 DIAGNOSIS — N186 End stage renal disease: Secondary | ICD-10-CM | POA: Diagnosis not present

## 2023-08-15 DIAGNOSIS — Z681 Body mass index (BMI) 19 or less, adult: Secondary | ICD-10-CM | POA: Diagnosis not present

## 2023-08-15 DIAGNOSIS — E43 Unspecified severe protein-calorie malnutrition: Secondary | ICD-10-CM | POA: Diagnosis present

## 2023-08-15 DIAGNOSIS — R001 Bradycardia, unspecified: Secondary | ICD-10-CM | POA: Diagnosis not present

## 2023-08-15 DIAGNOSIS — E041 Nontoxic single thyroid nodule: Secondary | ICD-10-CM | POA: Diagnosis not present

## 2023-08-15 DIAGNOSIS — N170 Acute kidney failure with tubular necrosis: Secondary | ICD-10-CM | POA: Diagnosis present

## 2023-08-15 DIAGNOSIS — Z951 Presence of aortocoronary bypass graft: Secondary | ICD-10-CM | POA: Diagnosis not present

## 2023-08-15 DIAGNOSIS — F0390 Unspecified dementia without behavioral disturbance: Secondary | ICD-10-CM | POA: Diagnosis not present

## 2023-08-15 DIAGNOSIS — N1832 Chronic kidney disease, stage 3b: Secondary | ICD-10-CM | POA: Diagnosis present

## 2023-08-15 DIAGNOSIS — I2510 Atherosclerotic heart disease of native coronary artery without angina pectoris: Secondary | ICD-10-CM | POA: Diagnosis not present

## 2023-08-15 DIAGNOSIS — I10 Essential (primary) hypertension: Secondary | ICD-10-CM | POA: Diagnosis not present

## 2023-08-15 DIAGNOSIS — N189 Chronic kidney disease, unspecified: Secondary | ICD-10-CM | POA: Diagnosis not present

## 2023-08-15 DIAGNOSIS — E860 Dehydration: Secondary | ICD-10-CM | POA: Diagnosis present

## 2023-08-15 DIAGNOSIS — K279 Peptic ulcer, site unspecified, unspecified as acute or chronic, without hemorrhage or perforation: Secondary | ICD-10-CM | POA: Diagnosis present

## 2023-08-15 DIAGNOSIS — R627 Adult failure to thrive: Secondary | ICD-10-CM | POA: Diagnosis present

## 2023-08-15 DIAGNOSIS — E870 Hyperosmolality and hypernatremia: Secondary | ICD-10-CM | POA: Diagnosis not present

## 2023-08-15 DIAGNOSIS — R4182 Altered mental status, unspecified: Secondary | ICD-10-CM | POA: Diagnosis not present

## 2023-08-15 DIAGNOSIS — G9341 Metabolic encephalopathy: Secondary | ICD-10-CM | POA: Diagnosis present

## 2023-08-15 DIAGNOSIS — E7849 Other hyperlipidemia: Secondary | ICD-10-CM | POA: Diagnosis not present

## 2023-08-15 DIAGNOSIS — R630 Anorexia: Secondary | ICD-10-CM | POA: Diagnosis not present

## 2023-08-15 DIAGNOSIS — D631 Anemia in chronic kidney disease: Secondary | ICD-10-CM | POA: Diagnosis not present

## 2023-08-25 DIAGNOSIS — N390 Urinary tract infection, site not specified: Secondary | ICD-10-CM | POA: Diagnosis not present

## 2023-08-28 DIAGNOSIS — R627 Adult failure to thrive: Secondary | ICD-10-CM | POA: Diagnosis not present

## 2023-08-28 DIAGNOSIS — E860 Dehydration: Secondary | ICD-10-CM | POA: Diagnosis not present

## 2023-08-28 DIAGNOSIS — F05 Delirium due to known physiological condition: Secondary | ICD-10-CM | POA: Diagnosis not present

## 2023-08-28 DIAGNOSIS — F03C0 Unspecified dementia, severe, without behavioral disturbance, psychotic disturbance, mood disturbance, and anxiety: Secondary | ICD-10-CM | POA: Diagnosis not present

## 2023-08-28 DIAGNOSIS — N183 Chronic kidney disease, stage 3 unspecified: Secondary | ICD-10-CM | POA: Diagnosis not present

## 2023-09-05 DIAGNOSIS — N183 Chronic kidney disease, stage 3 unspecified: Secondary | ICD-10-CM | POA: Diagnosis not present

## 2023-09-05 DIAGNOSIS — Z87440 Personal history of urinary (tract) infections: Secondary | ICD-10-CM | POA: Diagnosis not present

## 2023-09-05 DIAGNOSIS — N390 Urinary tract infection, site not specified: Secondary | ICD-10-CM | POA: Diagnosis not present

## 2023-09-05 DIAGNOSIS — F03C0 Unspecified dementia, severe, without behavioral disturbance, psychotic disturbance, mood disturbance, and anxiety: Secondary | ICD-10-CM | POA: Diagnosis not present

## 2023-09-26 DIAGNOSIS — R4182 Altered mental status, unspecified: Secondary | ICD-10-CM | POA: Diagnosis not present

## 2023-09-26 DIAGNOSIS — F03911 Unspecified dementia, unspecified severity, with agitation: Secondary | ICD-10-CM | POA: Diagnosis not present

## 2024-01-02 ENCOUNTER — Telehealth: Payer: Self-pay | Admitting: Internal Medicine

## 2024-01-02 NOTE — Telephone Encounter (Signed)
 Contacted Bruce Jordan. to schedule their annual wellness visit. Patient declined to schedule AWV at this time. Patient has moved to [facility] .  [facility] Care Guide [facility] AWV TEAM Direct Dial: [phone]
